# Patient Record
Sex: Female | Born: 1960
Health system: Southern US, Community
[De-identification: ages and names within clinical notes are randomized; demographics above are authoritative.]

## PROBLEM LIST (undated history)

## (undated) DIAGNOSIS — T7840XA Allergy, unspecified, initial encounter: Secondary | ICD-10-CM

## (undated) DIAGNOSIS — H8110 Benign paroxysmal vertigo, unspecified ear: Secondary | ICD-10-CM

## (undated) DIAGNOSIS — C439 Malignant melanoma of skin, unspecified: Secondary | ICD-10-CM

## (undated) DIAGNOSIS — U071 COVID-19: Secondary | ICD-10-CM

## (undated) DIAGNOSIS — Z9889 Other specified postprocedural states: Secondary | ICD-10-CM

## (undated) DIAGNOSIS — E785 Hyperlipidemia, unspecified: Secondary | ICD-10-CM

## (undated) DIAGNOSIS — R112 Nausea with vomiting, unspecified: Secondary | ICD-10-CM

## (undated) DIAGNOSIS — C4492 Squamous cell carcinoma of skin, unspecified: Secondary | ICD-10-CM

## (undated) HISTORY — PX: MICRODISCECTOMY LUMBAR: SUR864

## (undated) HISTORY — PX: CHOLECYSTECTOMY: SHX55

## (undated) HISTORY — PX: COLONOSCOPY: SHX174

## (undated) HISTORY — PX: BACK SURGERY: SHX140

## (undated) HISTORY — DX: Squamous cell carcinoma of skin, unspecified: C44.92

## (undated) HISTORY — DX: Malignant melanoma of skin, unspecified: C43.9

## (undated) HISTORY — PX: LIPOMA EXCISION: SHX5283

## (undated) HISTORY — DX: Hyperlipidemia, unspecified: E78.5

## (undated) HISTORY — DX: Allergy, unspecified, initial encounter: T78.40XA

## (undated) HISTORY — PX: BREAST SURGERY: SHX581

## (undated) HISTORY — DX: Benign paroxysmal vertigo, unspecified ear: H81.10

## (undated) HISTORY — PX: OTHER SURGICAL HISTORY: SHX169

## (undated) HISTORY — PX: APPENDECTOMY: SHX54

---

## 2012-02-23 ENCOUNTER — Other Ambulatory Visit: Payer: Self-pay | Admitting: Family Medicine

## 2012-02-23 DIAGNOSIS — N644 Mastodynia: Secondary | ICD-10-CM

## 2012-03-10 ENCOUNTER — Ambulatory Visit
Admission: RE | Admit: 2012-03-10 | Discharge: 2012-03-10 | Disposition: A | Discharge status: Home / Self Care | Payer: BC Managed Care – PPO | Source: Ambulatory Visit | Attending: Family Medicine | Admitting: Family Medicine

## 2012-03-10 DIAGNOSIS — N644 Mastodynia: Secondary | ICD-10-CM

## 2013-07-06 ENCOUNTER — Other Ambulatory Visit: Payer: Self-pay | Admitting: Family Medicine

## 2013-07-06 DIAGNOSIS — Z1231 Encounter for screening mammogram for malignant neoplasm of breast: Secondary | ICD-10-CM

## 2013-07-13 ENCOUNTER — Ambulatory Visit: Payer: BC Managed Care – PPO

## 2013-08-02 ENCOUNTER — Ambulatory Visit
Admission: RE | Admit: 2013-08-02 | Discharge: 2013-08-02 | Disposition: A | Discharge status: Home / Self Care | Payer: Managed Care, Other (non HMO) | Source: Ambulatory Visit | Attending: Family Medicine | Admitting: Family Medicine

## 2013-08-02 DIAGNOSIS — Z1231 Encounter for screening mammogram for malignant neoplasm of breast: Secondary | ICD-10-CM

## 2013-11-10 DIAGNOSIS — E785 Hyperlipidemia, unspecified: Secondary | ICD-10-CM | POA: Insufficient documentation

## 2014-06-13 ENCOUNTER — Encounter: Payer: Self-pay | Admitting: Nurse Practitioner

## 2014-06-13 ENCOUNTER — Ambulatory Visit (INDEPENDENT_AMBULATORY_CARE_PROVIDER_SITE_OTHER): Payer: BC Managed Care – PPO | Admitting: Nurse Practitioner

## 2014-06-13 VITALS — BP 124/77 | HR 63 | Temp 98.1°F | Resp 18 | Ht 64.0 in | Wt 151.0 lb

## 2014-06-13 DIAGNOSIS — Z23 Encounter for immunization: Secondary | ICD-10-CM

## 2014-06-13 DIAGNOSIS — R609 Edema, unspecified: Secondary | ICD-10-CM

## 2014-06-13 DIAGNOSIS — Z860101 Personal history of adenomatous and serrated colon polyps: Secondary | ICD-10-CM | POA: Insufficient documentation

## 2014-06-13 DIAGNOSIS — E785 Hyperlipidemia, unspecified: Secondary | ICD-10-CM | POA: Insufficient documentation

## 2014-06-13 DIAGNOSIS — Z86010 Personal history of colonic polyps: Secondary | ICD-10-CM

## 2014-06-13 NOTE — Progress Notes (Signed)
Subjective:     Mikayla Tucker is a 53 y.o. female who presents to establish care. She is concerned about a swollen area on her back. She noticed it last week. No pain. Skin looks normal. She gives a history of lipoma on abdomen that was removed several ya. Historical care at [facility] in [place]. She is not happy with follow up there & is seeking care with [place] today. She does not remember when her last Pap or Tdap was. She declines tdap today & will schedule CPE.  She reports having a SCC removed from R forearm 2 weeks ago.   The following portions of the patient's history were reviewed and updated as appropriate: allergies, current medications, past family history, past medical history, past social history, past surgical history and problem list.  Review of Systems Pertinent items are noted in HPI.    Objective:    BP 124/77  Pulse 63  Temp(Src) 98.1 F (36.7 C) (Oral)  Resp 18  Ht 5\' 4"  (1.626 m)  Wt 151 lb (68.493 kg)  BMI 25.91 kg/m2  SpO2 98% BP 124/77  Pulse 63  Temp(Src) 98.1 F (36.7 C) (Oral)  Resp 18  Ht 5\' 4"  (1.626 m)  Wt 151 lb (68.493 kg)  BMI 25.91 kg/m2  SpO2 98% General appearance: alert, cooperative, appears stated age and no distress Head: Normocephalic, without obvious abnormality, atraumatic Eyes: negative findings: lids and lashes normal and conjunctivae and sclerae normal Back: asymmetry noted in thoracic back due to soft tissue swelling at R thoracic region adjacent to spine. Feels like lipoma. About 3 cm X 2CM. Lungs: clear to auscultation bilaterally Heart: regular rate and rhythm, S1, S2 normal, no murmur, click, rub or gallop  Neck: no LAD, thyroid midline, No carotid bruit.  Assessment:  1. Need for prophylactic vaccination and inoculation against influenza - Flu Vaccine QUAD 36+ mos PF IM (Fluarix Quad PF)  2. Part of body swollen - [place] Chest; Future  3. Personal history of colonic polyps - Ambulatory referral to  Gastroenterology  4. Dyslipidemia Decrease crestor to 4d/wk from 7. Check again in 6 mos.  See problem list for complete A&P See pt instructions. F/u 1 mo.

## 2014-06-13 NOTE — Assessment & Plan Note (Signed)
Pt reports 1 polyp removed 10 ya. Will ref to GI for f/u. No old records to review.

## 2014-06-13 NOTE — Patient Instructions (Signed)
Please return for pelvic exam. I will check thyroid function & vitamin D at that time.  You have a healthy body mass index, so you are keeping your risk for chronic disease low.  Start crestor 4 days/week. I will check lipids again in 6 months.  Develop lifelong habits of exercise most days of the week: take a 30 minute walk. The benefits include weight loss, lower risk for heart disease, diabetes, stroke, high blood pressure, lower rates of depression & dementia, better sleep quality & bone health.  Please get ultrasound of back. I will call with results.  Nice to meet you!

## 2014-06-13 NOTE — Assessment & Plan Note (Signed)
2 cm swollen areas R back, adjacent to spine. Probably lipoma. She has Hx of lipoma on abdomen. No pain. Skin looks nml Will order [place].

## 2014-06-14 ENCOUNTER — Ambulatory Visit
Admission: RE | Admit: 2014-06-14 | Discharge: 2014-06-14 | Disposition: A | Discharge status: Home / Self Care | Payer: BC Managed Care – PPO | Source: Ambulatory Visit | Attending: Nurse Practitioner | Admitting: Nurse Practitioner

## 2014-06-14 ENCOUNTER — Telehealth: Payer: Self-pay | Admitting: Nurse Practitioner

## 2014-06-14 DIAGNOSIS — D171 Benign lipomatous neoplasm of skin and subcutaneous tissue of trunk: Secondary | ICD-10-CM

## 2014-06-14 DIAGNOSIS — R609 Edema, unspecified: Secondary | ICD-10-CM

## 2014-06-14 HISTORY — DX: Benign lipomatous neoplasm of skin and subcutaneous tissue of trunk: D17.1

## 2014-06-14 NOTE — Telephone Encounter (Signed)
Lipoma viewed on [place] of R back.

## 2014-07-07 ENCOUNTER — Encounter: Payer: BC Managed Care – PPO | Admitting: Nurse Practitioner

## 2014-07-20 ENCOUNTER — Encounter: Payer: Self-pay | Admitting: Nurse Practitioner

## 2014-08-11 ENCOUNTER — Other Ambulatory Visit: Payer: Self-pay

## 2014-08-11 ENCOUNTER — Other Ambulatory Visit: Payer: Self-pay | Admitting: Nurse Practitioner

## 2014-08-11 NOTE — Telephone Encounter (Signed)
[facility] requesting refill on Lorazepam 1 mg. Last office visit 06/13/2014

## 2014-08-11 NOTE — Telephone Encounter (Signed)
[facility] require a 90 day is this ok?

## 2014-08-11 NOTE — Telephone Encounter (Signed)
Please refill Ativan 30 T, 1 refill .

## 2014-08-14 ENCOUNTER — Other Ambulatory Visit: Payer: Self-pay

## 2014-08-14 MED ORDER — LORAZEPAM 1 MG PO TABS: 1.0000 mg | ORAL_TABLET | Freq: Two times a day (BID) | ORAL | Status: DC | PRN

## 2014-08-14 NOTE — Telephone Encounter (Signed)
Is she supposed to be taking this medication twice daily or once daily?

## 2014-08-14 NOTE — Telephone Encounter (Signed)
Yes. 90 tabs, 0 refill. Thank you.

## 2014-11-29 ENCOUNTER — Encounter: Payer: Self-pay | Admitting: Nurse Practitioner

## 2014-11-29 ENCOUNTER — Encounter: Payer: Self-pay | Admitting: Family Medicine

## 2014-11-29 ENCOUNTER — Encounter: Payer: Self-pay | Admitting: Internal Medicine

## 2014-11-29 ENCOUNTER — Ambulatory Visit (INDEPENDENT_AMBULATORY_CARE_PROVIDER_SITE_OTHER): Payer: BLUE CROSS/BLUE SHIELD | Admitting: Nurse Practitioner

## 2014-11-29 VITALS — BP 139/87 | HR 64 | Temp 97.4°F | Ht 64.0 in | Wt 152.0 lb

## 2014-11-29 DIAGNOSIS — D171 Benign lipomatous neoplasm of skin and subcutaneous tissue of trunk: Secondary | ICD-10-CM

## 2014-11-29 DIAGNOSIS — E785 Hyperlipidemia, unspecified: Secondary | ICD-10-CM

## 2014-11-29 DIAGNOSIS — Z0000 Encounter for general adult medical examination without abnormal findings: Secondary | ICD-10-CM | POA: Insufficient documentation

## 2014-11-29 DIAGNOSIS — Z1211 Encounter for screening for malignant neoplasm of colon: Secondary | ICD-10-CM

## 2014-11-29 DIAGNOSIS — L989 Disorder of the skin and subcutaneous tissue, unspecified: Secondary | ICD-10-CM

## 2014-11-29 LAB — CBC WITH DIFFERENTIAL/PLATELET
BASOS PCT: 1.1 % (ref 0.0–3.0)
Basophils Absolute: 0.1 10*3/uL (ref 0.0–0.1)
EOS ABS: 0.2 10*3/uL (ref 0.0–0.7)
Eosinophils Relative: 2.8 % (ref 0.0–5.0)
HCT: 39.8 % (ref 36.0–46.0)
Hemoglobin: 13.5 g/dL (ref 12.0–15.0)
Lymphocytes Relative: 36 % (ref 12.0–46.0)
Lymphs Abs: 2.1 10*3/uL (ref 0.7–4.0)
MCHC: 34 g/dL (ref 30.0–36.0)
MCV: 102.3 fl — AB (ref 78.0–100.0)
MONO ABS: 0.3 10*3/uL (ref 0.1–1.0)
Monocytes Relative: 6.1 % (ref 3.0–12.0)
NEUTROS PCT: 54 % (ref 43.0–77.0)
Neutro Abs: 3.1 10*3/uL (ref 1.4–7.7)
PLATELETS: 297 10*3/uL (ref 150.0–400.0)
RBC: 3.89 Mil/uL (ref 3.87–5.11)
RDW: 13 % (ref 11.5–15.5)
WBC: 5.8 10*3/uL (ref 4.0–10.5)

## 2014-11-29 LAB — COMPREHENSIVE METABOLIC PANEL
ALT: 28 U/L (ref 0–35)
AST: 25 U/L (ref 0–37)
Albumin: 4.7 g/dL (ref 3.5–5.2)
Alkaline Phosphatase: 92 U/L (ref 39–117)
BUN: 12 mg/dL (ref 6–23)
CALCIUM: 9.8 mg/dL (ref 8.4–10.5)
CO2: 26 mEq/L (ref 19–32)
Chloride: 99 mEq/L (ref 96–112)
Creatinine, Ser: 0.54 mg/dL (ref 0.40–1.20)
GFR: 125.56 mL/min (ref >60.00)
Glucose, Bld: 86 mg/dL (ref 70–99)
Potassium: 4.2 mEq/L (ref 3.5–5.1)
SODIUM: 133 meq/L — AB (ref 135–145)
TOTAL PROTEIN: 7.5 g/dL (ref 6.0–8.3)
Total Bilirubin: 0.5 mg/dL (ref 0.2–1.2)

## 2014-11-29 LAB — LIPID PANEL
CHOLESTEROL: 268 mg/dL — AB (ref 0–200)
HDL: 72.3 mg/dL (ref >39.00)
LDL Cholesterol: 177 mg/dL — ABNORMAL HIGH (ref 0–99)
NonHDL: 195.7
TRIGLYCERIDES: 96 mg/dL (ref 0.0–149.0)
Total CHOL/HDL Ratio: 4
VLDL: 19.2 mg/dL (ref 0.0–40.0)

## 2014-11-29 LAB — T4, FREE: FREE T4: 0.66 ng/dL (ref 0.60–1.60)

## 2014-11-29 LAB — HEMOGLOBIN A1C: Hgb A1c MFr Bld: 5.8 % (ref 4.6–6.5)

## 2014-11-29 LAB — TSH: TSH: 1.07 u[IU]/mL (ref 0.35–4.50)

## 2014-11-29 LAB — VITAMIN D 25 HYDROXY (VIT D DEFICIENCY, FRACTURES): VITD: 19.59 ng/mL — ABNORMAL LOW (ref 30.00–100.00)

## 2014-11-29 NOTE — Assessment & Plan Note (Signed)
Refer to derm for eval & treat.

## 2014-11-29 NOTE — Progress Notes (Signed)
Subjective:     Mikayla Tucker is a 54 y.o. female and is here for a comprehensive physical exam and f/u of hyperlipidemia. She also has abnormal exam finding of skin lesion.   Mikayla Tucker is currently treated for hyperlipidemia, she has had colon polyp in past & BCC removed from arm. Hx back surgery w/hardware-states prior to surgery she was walking w/cane. Used to be long distance runner. Prev care: due for Tdap, plans to establish w/gyn for Pap & MMG. No MC-early menopause at 54 yo. No postmenopausal bleeding. Reviewed BMI & discussed slight increased risk for obesity related disease. Discussed exercise & diet changes. Will ref to GI for colonoscopy for colon ca screen & f/u of polyp removal. Lipids: current med crestor 40 mg 5 d/wk. No intol SE. RF: truncal obesity. No fam Hx-adopted.  Skin lesion: see exam. Pt will sched APPT. W/derm. Will fax my notes today.    History   Social History  . Marital Status: Married    Spouse Name: N/A  . Number of Children: 1  . Years of Education: N/A   Occupational History  .      homemaker   Social History Main Topics  . Smoking status: Former Smoker -- 1.00 packs/day for 10 years  . Smokeless tobacco: Not on file     Comment: quit 54 yrs old  . Alcohol Use: 4.2 oz/week    7 Glasses of wine per week  . Drug Use: No  . Sexual Activity: Yes    Birth Control/ Protection: Post-menopausal   Other Topics Concern  . Not on file   Social History Narrative   Mikayla Tucker lives with her husband and daughter. She is originally from [place]-moved to [place]. She relocated to [facility] about [place] from [place].   Health Maintenance  Topic Date Due  . HIV Screening  12/31/1976  . PAP SMEAR  01/01/1980  . TETANUS/TDAP  12/31/1980  . COLONOSCOPY  01/01/2012  . INFLUENZA VACCINE  05/07/2015  . MAMMOGRAM  08/03/2015    The following portions of the patient's history were reviewed and updated as appropriate: allergies, current medications, past family history,  past medical history, past social history, past surgical history and problem list.  Review of Systems Constitutional: negative for fatigue and fevers Eyes: positive for contacts/glasses and had recent eye exam-no changes Ears, nose, mouth, throat, and face: positive for chronic rhinitis relieved by sinus rinse, negative for sore throat Cardiovascular: negative for exertional chest pressure/discomfort, irregular heart beat and lower extremity edema Gastrointestinal: negative for abdominal pain, change in bowel habits, constipation, diarrhea and dyspepsia Musculoskeletal:positive for occasional back pain   Objective:    BP 139/87 mmHg  Pulse 64  Temp(Src) 97.4 F (36.3 C) (Temporal)  Ht 5\' 4"  (1.626 m)  Wt 152 lb (68.947 kg)  BMI 26.08 kg/m2  SpO2 99% General appearance: alert, cooperative, appears stated age and no distress Head: Normocephalic, without obvious abnormality, atraumatic Eyes: negative findings: lids and lashes normal, conjunctivae and sclerae normal, corneas clear and pupils equal, round, reactive to light and accomodation Ears: normal TM's and external ear canals both ears Throat: lips, mucosa, and tongue normal; teeth and gums normal and geographic tongue Neck: no adenopathy, no carotid bruit, supple, symmetrical, trachea midline and thyroid not enlarged, symmetric, no tenderness/mass/nodules Back: well healed lumbar midline scar. large lipoma R back adjacent to spine. No change. about 3-4 inches vertical axis, 1.5-2" horiz axis. Lungs: clear to auscultation bilaterally Heart: regular rate  and rhythm, S1, S2 normal, no murmur, click, rub or gallop Abdomen: soft, non-tender; bowel sounds normal; no masses,  no organomegaly and truncal obesity Extremities: no edema, redness or tenderness in the calves or thighs and very thin legs Pulses: 2+ and symmetric Skin: well healed scar on each forearm-pt states from lesion removal. R scar slightly pink w/small scab-pt says she  pulled suture out of it recently. Back: R thorax under bra purple 1 cm lesion that blanches with smaller darker central irreg -shaped pigment that does not blanch. Lymph nodes: Cervical, supraclavicular, and axillary nodes normal. Neurologic: Grossly normal    Assessment:Plan   1. Preventative health care Needs Tdap Will see gyn for pap & MMg - CBC with Differential/Platelet - Comprehensive metabolic panel - Lipid panel - Hemoglobin A1c - Vit D  25 hydroxy (rtn osteoporosis monitoring) - TSH - T4, free - HIV antibody Discussed diet & exercise changes  2. Colon cancer screening persnl Hx polyp - Ambulatory referral to Gastroenterology  3. Dyslipidemia, chronic, stable - Comprehensive metabolic panel - Lipid panel - Hemoglobin A1c  4. Skin lesion of back, New See derm  5. Lipoma of back, stable Reviewed [place], no f/u recommended. Cosmetic only  F/u 6 mos-lipids See pt instructions

## 2014-11-29 NOTE — Progress Notes (Signed)
Makes sense. Thank you!

## 2014-11-29 NOTE — Progress Notes (Signed)
Pre visit review using our clinic review tool, if applicable. No additional management support is needed unless otherwise documented below in the visit note. 

## 2014-11-29 NOTE — Assessment & Plan Note (Signed)
Lipids today

## 2014-11-29 NOTE — Assessment & Plan Note (Signed)
Needs tdap-declined Due for mmg, pap-will see gynecology Due for colonoscopy-pers Hx polyps Needs tdap Increase exercise 30 mins to 1 hr daily Few diet changes-read Eat to Live

## 2014-11-29 NOTE — Patient Instructions (Signed)
Call dermatology for evaluation of purple lesion on back. See gynecology. Consider [facility]. Schedule mammogram. Schedule colonoscopy.  Normal body mass index is 19 to 24.9. You are at 25. For best health keep calories under 1860 daily.  Develop lifelong habits of exercise most days of the week: take a 30 minute walk. The benefits include weight loss, lower risk for heart disease, diabetes, stroke, high blood pressure, lower rates of depression & dementia, better sleep quality & bone health.  Consider reading Eat to Live by Excell Seltzer and begin implementing principles for best human nutrition & health.   Cut out refined sugar:anything that is sweet when you eat or drink it except fresh fruit. Cut out white bread, rolls, biscuits, bagels, muffins, pasta and cereals. Breads & cereals that have 4 gm or more of fiber per serving are good. Whole wheat pasta, brown rice and quinoa are good.  Minimize alcoholic beverages. A glass of wine has 150 to 200 calories-the sweeter the wine, the more calories.   My office will call with lab results.  Great to see you!

## 2014-11-30 ENCOUNTER — Other Ambulatory Visit: Payer: Self-pay | Admitting: Nurse Practitioner

## 2014-11-30 ENCOUNTER — Ambulatory Visit (INDEPENDENT_AMBULATORY_CARE_PROVIDER_SITE_OTHER): Payer: BLUE CROSS/BLUE SHIELD | Admitting: Nurse Practitioner

## 2014-11-30 ENCOUNTER — Telehealth: Payer: Self-pay | Admitting: Nurse Practitioner

## 2014-11-30 ENCOUNTER — Encounter: Payer: Self-pay | Admitting: Nurse Practitioner

## 2014-11-30 VITALS — BP 120/82 | HR 81 | Temp 98.4°F | Ht 64.0 in

## 2014-11-30 DIAGNOSIS — E785 Hyperlipidemia, unspecified: Secondary | ICD-10-CM

## 2014-11-30 DIAGNOSIS — E559 Vitamin D deficiency, unspecified: Secondary | ICD-10-CM

## 2014-11-30 DIAGNOSIS — R7309 Other abnormal glucose: Secondary | ICD-10-CM

## 2014-11-30 DIAGNOSIS — R7303 Prediabetes: Secondary | ICD-10-CM | POA: Insufficient documentation

## 2014-11-30 LAB — HIV ANTIBODY (ROUTINE TESTING W REFLEX): HIV 1&2 Ab, 4th Generation: NONREACTIVE

## 2014-11-30 MED ORDER — ROSUVASTATIN CALCIUM 40 MG PO TABS: 40.0000 mg | ORAL_TABLET | Freq: Every day | ORAL | Status: DC

## 2014-11-30 NOTE — Telephone Encounter (Signed)
Patient returned call and was given results. Patient scheduled appt for this afternoon.

## 2014-11-30 NOTE — Patient Instructions (Signed)
To decrease risk for diabetes exercise daily at least 30 minutes. Taking a brisk walk counts. Cut out  refined sugar:anything that is sweet when you eat or drink it except fresh fruit. Cut refined grains: white bread, rolls, biscuits, bagels, muffins, pasta and cereals. Grains that have 4 gm or more of fiber per serving are good.  Minimize alcohol intake to 2-3 times week or less.  Read Eat to Live!  Start D3 5000 iu daily with meal. Take for 12 weeks then return for lab.

## 2014-11-30 NOTE — Assessment & Plan Note (Signed)
Increase crestor to daily Start 1000 to 2000 mg qd mega red krill oil increase exercise Pt does not want to cut back wine consumption, but will switch from white to red for less sugar Already eats healthy:meat 2-3 times week, cheese once week, a lot of fruit & veggies Repeat lipids in 12 weeks

## 2014-11-30 NOTE — Telephone Encounter (Signed)
pls call pt: Advise Overall labs are good, but I would like to speak with her about increased risk for diabetes & preventive measures.pLS SCHEDULE ov. ALSO, VIT d IS LOW. sTART D3 5000 IU QD WITH MEAL for 12 weeks & will recheck.

## 2014-11-30 NOTE — Progress Notes (Signed)
Subjective:     Mikayla Tucker is a 54 y.o. female presents to discuss new Dx of prediabetes & vitamin d deficiency and ongoing management of lipids. Meets 2 criteria for metabolic syndrome: truncal obesity & elevated BS.  Prediabetes: A1C 5.8. Discussed increased risk of developing diabetes. Discussed exercise & diet interventions to lower risk. Vit D def: level 19. Hx skin cancer. Discussed correlative studies linking cancers & AI disease to low D. Discussed starting supplement.   Lipids: LDL too high-170. Taking crestor 5d/wk. Will increase to daily. Discussed benefits of Increasing exercise & diet changes. Already eats healthy diet, but could cut back on wine consumption-she would rather not.  The following portions of the patient's history were reviewed and updated as appropriate: allergies, current medications, past medical history, past social history, past surgical history and problem list.  Review of Systems Musculoskeletal:negative, back feels better when walks, pain does not prevent moderate exercise    Objective:    BP 120/82 mmHg  Pulse 81  Temp(Src) 98.4 F (36.9 C) (Oral)  Ht 5\' 4"  (1.626 m)  SpO2 96% BP 120/82 mmHg  Pulse 81  Temp(Src) 98.4 F (36.9 C) (Oral)  Ht 5\' 4"  (1.626 m)  SpO2 96% General appearance: alert, cooperative, appears stated age and no distress Head: Normocephalic, without obvious abnormality, atraumatic Eyes: negative findings: lids and lashes normal and conjunctivae and sclerae normal Neurologic: Grossly normal    Assessment:Plan    1. Vitamin D deficiency Start d3 5000 iu qd repeat 12 wks  2. Prediabetes Increase exercise Diet changes Repeat 12 wks   3. Dyslipidemia Increase crestor to daily 1000 mg krill oil daily Increase exercise Diet changes Repeat 12 wks

## 2014-11-30 NOTE — Assessment & Plan Note (Addendum)
A1C 5.8 Increase exercise to daily-30 minutes or more Decrease wine consumption-pt declines, but will switch from white to red. Does not eat sugar. Check A1C in 12 weeks.

## 2014-11-30 NOTE — Telephone Encounter (Signed)
LMOVM for pt to return call 

## 2014-11-30 NOTE — Assessment & Plan Note (Signed)
Start D3 5000 iu qd with meal Repeat level in 12 wks

## 2014-11-30 NOTE — Progress Notes (Signed)
Pre visit review using our clinic review tool, if applicable. No additional management support is needed unless otherwise documented below in the visit note. 

## 2014-12-20 ENCOUNTER — Ambulatory Visit (AMBULATORY_SURGERY_CENTER): Payer: Self-pay | Admitting: *Deleted

## 2014-12-20 VITALS — Ht 64.0 in | Wt 150.0 lb

## 2014-12-20 DIAGNOSIS — Z1211 Encounter for screening for malignant neoplasm of colon: Secondary | ICD-10-CM

## 2014-12-20 MED ORDER — MOVIPREP 100 G PO SOLR: 1.0000 | Freq: Once | ORAL | Status: DC

## 2014-12-20 NOTE — Progress Notes (Signed)
No egg or soy allergy. No anesthesia problems.  No home O2.  No diet meds.  

## 2014-12-22 ENCOUNTER — Telehealth: Payer: Self-pay | Admitting: Internal Medicine

## 2014-12-22 NOTE — Telephone Encounter (Signed)
Instructions with new date and times sent to patient's mailing address.  Angela/previsit

## 2014-12-25 ENCOUNTER — Encounter (HOSPITAL_COMMUNITY): Payer: Self-pay | Admitting: Family Medicine

## 2014-12-25 ENCOUNTER — Emergency Department (HOSPITAL_COMMUNITY)
Admission: EM | Admit: 2014-12-25 | Discharge: 2014-12-25 | Disposition: A | Discharge status: Home / Self Care | Payer: BLUE CROSS/BLUE SHIELD | Attending: Emergency Medicine | Admitting: Emergency Medicine

## 2014-12-25 ENCOUNTER — Emergency Department (HOSPITAL_COMMUNITY): Payer: BLUE CROSS/BLUE SHIELD

## 2014-12-25 DIAGNOSIS — Z79899 Other long term (current) drug therapy: Secondary | ICD-10-CM | POA: Insufficient documentation

## 2014-12-25 DIAGNOSIS — Z85828 Personal history of other malignant neoplasm of skin: Secondary | ICD-10-CM | POA: Diagnosis not present

## 2014-12-25 DIAGNOSIS — E785 Hyperlipidemia, unspecified: Secondary | ICD-10-CM | POA: Insufficient documentation

## 2014-12-25 DIAGNOSIS — Y9389 Activity, other specified: Secondary | ICD-10-CM | POA: Insufficient documentation

## 2014-12-25 DIAGNOSIS — Y998 Other external cause status: Secondary | ICD-10-CM | POA: Diagnosis not present

## 2014-12-25 DIAGNOSIS — Y9289 Other specified places as the place of occurrence of the external cause: Secondary | ICD-10-CM | POA: Insufficient documentation

## 2014-12-25 DIAGNOSIS — W1839XA Other fall on same level, initial encounter: Secondary | ICD-10-CM | POA: Diagnosis not present

## 2014-12-25 DIAGNOSIS — S299XXA Unspecified injury of thorax, initial encounter: Secondary | ICD-10-CM | POA: Insufficient documentation

## 2014-12-25 DIAGNOSIS — Z87891 Personal history of nicotine dependence: Secondary | ICD-10-CM | POA: Insufficient documentation

## 2014-12-25 DIAGNOSIS — M940 Chondrocostal junction syndrome [Tietze]: Secondary | ICD-10-CM

## 2014-12-25 MED ORDER — HYDROCODONE-ACETAMINOPHEN 5-325 MG PO TABS
2.0000 | ORAL_TABLET | Freq: Once | ORAL | Status: AC
  Administered 2014-12-25: 2 via ORAL
  Filled 2014-12-25: qty 2

## 2014-12-25 MED ORDER — KETOROLAC TROMETHAMINE 60 MG/2ML IM SOLN
30.0000 mg | Freq: Once | INTRAMUSCULAR | Status: AC
  Administered 2014-12-25: 30 mg via INTRAMUSCULAR
  Filled 2014-12-25: qty 2

## 2014-12-25 NOTE — ED Provider Notes (Signed)
CSN: [id number]     Arrival date & time 12/25/14  3570 History  This chart was scribed for non-physician practitioner, Al Corpus, PA-C, working with Evelina Bucy, MD by Ladene Artist, ED Scribe. This patient was seen in room TR08C/TR08C and the patient's care was started at 10:01 AM.   Chief Complaint  Patient presents with  . Rib Injury   The history is provided by the patient. No language interpreter was used.   HPI Comments: Mikayla Tucker is a 54 y.o. female, with a h/o hyperlipidemia, SCC CA, who presents to the Emergency Department complaining of persistent R rib pain for the past 2 days. Pain is exacerbated with coughing, sneezing, deep breathing, and movement. She reports a fall 3 days ago but states that she did not think it was anything serious. Pt denies fever, chills, sore throat, rhinorrhea. No sick contacts. She states that she has had a fatty tumor removed from the area 10 years ago. Pt has been treating with Advil with minimal relief    Past Medical History  Diagnosis Date  . Allergy   . Cancer     SCC  . Hyperlipidemia    Past Surgical History  Procedure Laterality Date  . Appendectomy    . Cholecystectomy    . Breast surgery      biopsy  . Back surgery    . Lipoma excision      under breast on R side  . Colonoscopy     Family History  Problem Relation Age of Onset  . Adopted: Yes  . Colon cancer Neg Hx    History  Substance Use Topics  . Smoking status: Former Smoker -- 1.00 packs/day for 10 years  . Smokeless tobacco: Never Used     Comment: quit 55 yrs old  . Alcohol Use: 4.2 oz/week    7 Glasses of wine per week   OB History    No data available     Review of Systems  Constitutional: Negative for fever and chills.  HENT: Negative for rhinorrhea and sore throat.   Musculoskeletal:       + Rib pain   Allergies  Hydrocodone-acetaminophen  Home Medications   Prior to Admission medications   Medication Sig Start Date End Date Taking?  Authorizing Provider  KRILL OIL OMEGA-3 PO Take by mouth.    Historical Provider, MD  LORazepam (ATIVAN) 1 MG tablet Take 1 tablet (1 mg total) by mouth 2 (two) times daily as needed for anxiety. 08/14/14   Irene Pap, NP  MOVIPREP 100 G SOLR Take 1 kit (200 g total) by mouth once. Name brand only, movi prep as directed, no substitutions. 12/20/14   Lafayette Dragon, MD  rosuvastatin (CRESTOR) 40 MG tablet Take 1 tablet (40 mg total) by mouth daily. 11/30/14   Irene Pap, NP  Vitamin D, Ergocalciferol, (DRISDOL) 50000 UNITS CAPS capsule Take 50,000 Units by mouth every 7 (seven) days.    Historical Provider, MD   BP 149/56 mmHg  Pulse 60  Temp(Src) 97.5 F (36.4 C)  Resp 18  SpO2 98% Physical Exam  Constitutional: She appears well-developed and well-nourished. No distress.  HENT:  Head: Normocephalic and atraumatic.  Eyes: Conjunctivae and EOM are normal. Right eye exhibits no discharge. Left eye exhibits no discharge.  Cardiovascular: Normal rate and regular rhythm.   Pulmonary/Chest: Effort normal and breath sounds normal. No respiratory distress. She has no wheezes.  Abdominal: Soft. Bowel sounds are normal. She exhibits no  distension. There is no tenderness.  Musculoskeletal:  Tenderness below R breast and laterally. No skin changes, flail chest or subcutaneous air.   Neurological: She is alert. She exhibits normal muscle tone. Coordination normal.  Skin: Skin is warm and dry. She is not diaphoretic.  Nursing note and vitals reviewed.  ED Course  Procedures (including critical care time) DIAGNOSTIC STUDIES: Oxygen Saturation is 98% on RA, normal by my interpretation.    COORDINATION OF CARE: 10:06 AM-Discussed treatment plan which includes CXR, Tylenol, ibuprofen, Toradol injection, and Vicodin with pt at bedside and pt agreed to plan.   Labs Review Labs Reviewed - No data to display  Imaging Review Dg Ribs Unilateral W/chest Right  12/25/2014   CLINICAL DATA:  Fall on  right side 4 days ago with persistent right-sided chest pain and shortness of Breath  EXAM: RIGHT RIBS AND CHEST - 3+ VIEW  COMPARISON:  None.  FINDINGS: Cardiac shadow is within normal limits. The lungs are well aerated bilaterally without focal infiltrate or sizable effusion. No pneumothorax is noted. No acute rib fracture is identified.  IMPRESSION: No acute abnormality noted.   Electronically Signed   By: Inez Catalina M.D.   On: 12/25/2014 09:35    EKG Interpretation None      MDM   Final diagnoses:  Costochondritis   Patient presenting with fall and right rib pain that's aggravated with movement. She denies any URI symptoms. VSS. No skin changes no flail chest or subcutaneous air. Chest x-ray without rib fracture or acute lung injury. Patient likely with costochondritis pain managed NAD. Patient to continue to treat with rice protocol as well as ibuprofen. She is to follow-up with PCP for persistent symptoms.  Discussed return precautions with patient. Discussed all results and patient verbalizes understanding and agrees with plan.  I personally performed the services described in this documentation, which was scribed in my presence. The recorded information has been reviewed and is accurate.   Al Corpus, PA-C 12/25/14 1023  Evelina Bucy, MD 12/25/14 [phone]

## 2014-12-25 NOTE — Discharge Instructions (Signed)
Return to the emergency room with worsening of symptoms, new symptoms or with symptoms that are concerning, especially difficulty breathing, shortness of breath, chest pain, bruising, unable to move to the pain. RICE: Rest, Ice (three cycles of 20 mins on, 55mins off at least twice a day), compression/brace, elevation. Heating pad works well for back pain. Ibuprofen 400mg  (2 tablets 200mg ) every 5-6 hours for 3-5 days. Follow up with PCP if symptoms worsen or are persistent. Read below information and follow recommendations. Costochondritis Costochondritis, sometimes called Tietze syndrome, is a swelling and irritation (inflammation) of the tissue (cartilage) that connects your ribs with your breastbone (sternum). It causes pain in the chest and rib area. Costochondritis usually goes away on its own over time. It can take up to 6 weeks or longer to get better, especially if you are unable to limit your activities. CAUSES  Some cases of costochondritis have no known cause. Possible causes include:  Injury (trauma).  Exercise or activity such as lifting.  Severe coughing. SIGNS AND SYMPTOMS  Pain and tenderness in the chest and rib area.  Pain that gets worse when coughing or taking deep breaths.  Pain that gets worse with specific movements. DIAGNOSIS  Your health care provider will do a physical exam and ask about your symptoms. Chest X-rays or other tests may be done to rule out other problems. TREATMENT  Costochondritis usually goes away on its own over time. Your health care provider may prescribe medicine to help relieve pain. HOME CARE INSTRUCTIONS   Avoid exhausting physical activity. Try not to strain your ribs during normal activity. This would include any activities using chest, abdominal, and side muscles, especially if heavy weights are used.  Apply ice to the affected area for the first 2 days after the pain begins.  Put ice in a plastic bag.  Place a towel between your  skin and the bag.  Leave the ice on for 20 minutes, 2-3 times a day.  Only take over-the-counter or prescription medicines as directed by your health care provider. SEEK MEDICAL CARE IF:  You have redness or swelling at the rib joints. These are signs of infection.  Your pain does not go away despite rest or medicine. SEEK IMMEDIATE MEDICAL CARE IF:   Your pain increases or you are very uncomfortable.  You have shortness of breath or difficulty breathing.  You cough up blood.  You have worse chest pains, sweating, or vomiting.  You have a fever or persistent symptoms for more than 2-3 days.  You have a fever and your symptoms suddenly get worse. MAKE SURE YOU:   Understand these instructions.  Will watch your condition.  Will get help right away if you are not doing well or get worse. Document Released: 07/02/2005 Document Revised: 07/13/2013 Document Reviewed: 04/26/2013 [facility] Patient Information 2015 [place], [place]. This information is not intended to replace advice given to you by your health care provider. Make sure you discuss any questions you have with your health care provider.

## 2014-12-25 NOTE — ED Notes (Signed)
Pt here for right rib pain. sts that she fell on the coffee table Friday. sts hurts when she moves and coughs.

## 2014-12-27 ENCOUNTER — Ambulatory Visit (INDEPENDENT_AMBULATORY_CARE_PROVIDER_SITE_OTHER): Payer: BLUE CROSS/BLUE SHIELD | Admitting: Nurse Practitioner

## 2014-12-27 ENCOUNTER — Encounter: Payer: Self-pay | Admitting: Nurse Practitioner

## 2014-12-27 VITALS — BP 135/83 | HR 67 | Temp 97.4°F | Ht 64.0 in | Wt 151.0 lb

## 2014-12-27 DIAGNOSIS — M940 Chondrocostal junction syndrome [Tietze]: Secondary | ICD-10-CM

## 2014-12-27 DIAGNOSIS — F419 Anxiety disorder, unspecified: Secondary | ICD-10-CM

## 2014-12-27 MED ORDER — ONDANSETRON 8 MG PO TBDP: 8.0000 mg | ORAL_TABLET | Freq: Two times a day (BID) | ORAL | Status: DC | PRN

## 2014-12-27 MED ORDER — HYDROCODONE-ACETAMINOPHEN 5-325 MG PO TABS: 1.0000 | ORAL_TABLET | Freq: Two times a day (BID) | ORAL | Status: DC | PRN

## 2014-12-27 MED ORDER — LORAZEPAM 1 MG PO TABS: 1.0000 mg | ORAL_TABLET | Freq: Two times a day (BID) | ORAL | Status: DC | PRN

## 2014-12-27 NOTE — Patient Instructions (Signed)
Start advil/ibuprophen: 600 to 800 mg with food, 3 times daily in first 3 days, then 400 to 600 mg twice daily as needed. You may add 1000 mg tylenol twice daily if needed. Take hydrocodone & zofran twice daily if needed when not driving or using machinery. Use heat to painful area as often as able. Take 5 to 10 deep breaths every hour to avoid pneumonia. Move shoulder through range of motion daily to prevent frozen shoulder. Please return in 2 weeks to evaluate. Let us know if you have concerns.   Costochondritis Costochondritis, sometimes called Tietze syndrome, is a swelling and irritation (inflammation) of the tissue (cartilage) that connects your ribs with your breastbone (sternum). It causes pain in the chest and rib area. Costochondritis usually goes away on its own over time. It can take up to 6 weeks or longer to get better, especially if you are unable to limit your activities. CAUSES  Some cases of costochondritis have no known cause. Possible causes include:  Injury (trauma).  Exercise or activity such as lifting.  Severe coughing. SIGNS AND SYMPTOMS  Pain and tenderness in the chest and rib area.  Pain that gets worse when coughing or taking deep breaths.  Pain that gets worse with specific movements. DIAGNOSIS  Your health care provider will do a physical exam and ask about your symptoms. Chest X-rays or other tests may be done to rule out other problems. TREATMENT  Costochondritis usually goes away on its own over time. Your health care provider may prescribe medicine to help relieve pain. HOME CARE INSTRUCTIONS   Avoid exhausting physical activity. Try not to strain your ribs during normal activity. This would include any activities using chest, abdominal, and side muscles, especially if heavy weights are used.  Apply ice to the affected area for the first 2 days after the pain begins.  Put ice in a plastic bag.  Place a towel between your skin and the  bag.  Leave the ice on for 20 minutes, 2-3 times a day.  Only take over-the-counter or prescription medicines as directed by your health care provider. SEEK MEDICAL CARE IF:  You have redness or swelling at the rib joints. These are signs of infection.  Your pain does not go away despite rest or medicine. SEEK IMMEDIATE MEDICAL CARE IF:   Your pain increases or you are very uncomfortable.  You have shortness of breath or difficulty breathing.  You cough up blood.  You have worse chest pains, sweating, or vomiting.  You have a fever or persistent symptoms for more than 2-3 days.  You have a fever and your symptoms suddenly get worse. MAKE SURE YOU:   Understand these instructions.  Will watch your condition.  Will get help right away if you are not doing well or get worse. Document Released: 07/02/2005 Document Revised: 07/13/2013 Document Reviewed: 04/26/2013 [facility] Patient Information 2015 [place], [place]. This information is not intended to replace advice given to you by your health care provider. Make sure you discuss any questions you have with your health care provider.

## 2014-12-27 NOTE — Progress Notes (Signed)
Subjective:    Mikayla Tucker is a 54 y.o. female who presents for evaluation of chest wall pain. Onset was 4 days ago. Symptoms have been stable since that time. The patient describes the pain as aching, sharp and stabbing in the lateral chest wall: on the right. Patient rates pain as a 8/10 in intensity. Associated symptoms are: none. Aggravating factors are: coughing, deep inspiration, exercise, lifting. Alleviating factors are: medications hydrocodone and rest. Mechanism of injury: hit edge of dresser night before pain, lifted 40 lb bag of seed, driving "straight drive" vehicle last few days. Previous visits for this problem: ER visit 3/21. Evaluation to date: chest x-ray which was normal and rib x-ray which was normal. Treatment to date: opioid analgesics: effective-took 1/2 daughter's hydrocodone with relief. Took 1/2 tab zodran as hydrocodone causes nausea.  The following portions of the patient's history were reviewed and updated as appropriate: allergies, current medications, past medical history, past social history, past surgical history and problem list.  Review of Systems Pertinent items are noted in HPI.   Objective:    BP 135/83 mmHg  Pulse 67  Temp(Src) 97.4 F (36.3 C) (Temporal)  Ht 5\' 4"  (1.626 m)  Wt 151 lb (68.493 kg)  BMI 25.91 kg/m2  SpO2 99% General appearance: alert, cooperative, appears stated age and mild distress Head: Normocephalic, without obvious abnormality, atraumatic Eyes: negative findings: lids and lashes normal and conjunctivae and sclerae normal Lungs: clear to auscultation bilaterally Heart: regular rate and rhythm, S1, S2 normal, no murmur, click, rub or gallop Neurologic: Grossly normal MSK: tender to palpation lat R ribs. Pain with deep inspiration & abduction of R arm.  Imaging Chest x-ray: normal chest x-ray and Cardiac shadow is within normal limits. The lungs are well aerated no rib Fracture or other abnormality  Assessment:Plan  1.  Costochondritis ibuprophen-see pt instructions - HYDROcodone-acetaminophen (NORCO/VICODIN) 5-325 MG per tablet; Take 1 tablet by mouth 2 (two) times daily as needed for moderate pain.  Dispense: 15 tablet; Refill: 0 - ondansetron (ZOFRAN-ODT) 8 MG disintegrating tablet; Place 1 tablet (8 mg total) inside cheek 2 (two) times daily as needed for nausea or vomiting.  Dispense: 20 tablet; Refill: 0  2. Anxiety - LORazepam (ATIVAN) 1 MG tablet; Take 1 tablet (1 mg total) by mouth 2 (two) times daily as needed for anxiety.  Dispense: 90 tablet; Refill: 0  F/u 2 weeks

## 2014-12-27 NOTE — Progress Notes (Signed)
Pre visit review using our clinic review tool, if applicable. No additional management support is needed unless otherwise documented below in the visit note. 

## 2015-01-03 ENCOUNTER — Other Ambulatory Visit: Payer: Self-pay

## 2015-01-03 ENCOUNTER — Encounter: Payer: BLUE CROSS/BLUE SHIELD | Admitting: Internal Medicine

## 2015-01-03 DIAGNOSIS — F419 Anxiety disorder, unspecified: Secondary | ICD-10-CM

## 2015-01-03 NOTE — Telephone Encounter (Signed)
error 

## 2015-01-10 ENCOUNTER — Encounter: Payer: Self-pay | Admitting: Internal Medicine

## 2015-01-12 ENCOUNTER — Telehealth: Payer: Self-pay | Admitting: Internal Medicine

## 2015-01-12 ENCOUNTER — Encounter: Payer: Self-pay | Admitting: *Deleted

## 2015-01-12 NOTE — Telephone Encounter (Signed)
New instructions mailed to patient. Patient aware.

## 2015-01-17 ENCOUNTER — Encounter: Payer: BLUE CROSS/BLUE SHIELD | Admitting: Internal Medicine

## 2015-02-05 ENCOUNTER — Other Ambulatory Visit: Payer: Self-pay

## 2015-02-05 DIAGNOSIS — E785 Hyperlipidemia, unspecified: Secondary | ICD-10-CM

## 2015-02-05 MED ORDER — ROSUVASTATIN CALCIUM 40 MG PO TABS: 40.0000 mg | ORAL_TABLET | Freq: Every day | ORAL | Status: DC

## 2015-02-05 NOTE — Telephone Encounter (Signed)
Please Advise Refill Request? Refill request for- Crestor 40mg , 90 day supply Last filled by MD on - 11/30/14 Last Appt - 12/27/14        Next Appt - 03/01/15 [facility]

## 2015-02-23 ENCOUNTER — Encounter: Payer: BLUE CROSS/BLUE SHIELD | Admitting: Internal Medicine

## 2015-02-26 ENCOUNTER — Other Ambulatory Visit: Payer: Self-pay

## 2015-02-26 DIAGNOSIS — F419 Anxiety disorder, unspecified: Secondary | ICD-10-CM

## 2015-02-26 MED ORDER — LORAZEPAM 1 MG PO TABS: 1.0000 mg | ORAL_TABLET | Freq: Two times a day (BID) | ORAL | Status: DC | PRN

## 2015-02-26 NOTE — Telephone Encounter (Signed)
Already has appt for 03/01/15

## 2015-02-26 NOTE — Telephone Encounter (Signed)
pls fax script for 30 day supply. pls call pt: Advise Script sent, would like to see her in ofc to discuss anxiety management within 30 days.

## 2015-02-26 NOTE — Telephone Encounter (Signed)
Please Advise Refill Request? Refill request for- Lorazepam 1 mg tab Last filled by MD on - 12/27/14 Last Appt - 12/27/14        Next Appt - 03/01/15 Pharmacy- [facility]

## 2015-02-28 ENCOUNTER — Other Ambulatory Visit: Payer: BLUE CROSS/BLUE SHIELD

## 2015-03-01 ENCOUNTER — Ambulatory Visit: Payer: BLUE CROSS/BLUE SHIELD | Admitting: Nurse Practitioner

## 2015-04-16 ENCOUNTER — Other Ambulatory Visit: Payer: Self-pay | Admitting: *Deleted

## 2015-04-16 DIAGNOSIS — E785 Hyperlipidemia, unspecified: Secondary | ICD-10-CM

## 2015-04-16 MED ORDER — ROSUVASTATIN CALCIUM 40 MG PO TABS: 40.0000 mg | ORAL_TABLET | Freq: Every day | ORAL | Status: DC

## 2015-04-23 ENCOUNTER — Other Ambulatory Visit: Payer: Self-pay | Admitting: Family Medicine

## 2015-04-23 DIAGNOSIS — E785 Hyperlipidemia, unspecified: Secondary | ICD-10-CM

## 2015-04-24 MED ORDER — ROSUVASTATIN CALCIUM 40 MG PO TABS: 40.0000 mg | ORAL_TABLET | Freq: Every day | ORAL | Status: DC

## 2015-05-25 ENCOUNTER — Encounter: Payer: Self-pay | Admitting: Family Medicine

## 2015-05-25 ENCOUNTER — Ambulatory Visit (INDEPENDENT_AMBULATORY_CARE_PROVIDER_SITE_OTHER): Payer: Managed Care, Other (non HMO) | Admitting: Family Medicine

## 2015-05-25 VITALS — BP 121/80 | HR 81 | Temp 98.0°F | Resp 16 | Ht 64.0 in | Wt 150.0 lb

## 2015-05-25 DIAGNOSIS — Z1239 Encounter for other screening for malignant neoplasm of breast: Secondary | ICD-10-CM

## 2015-05-25 DIAGNOSIS — R7303 Prediabetes: Secondary | ICD-10-CM

## 2015-05-25 DIAGNOSIS — E559 Vitamin D deficiency, unspecified: Secondary | ICD-10-CM

## 2015-05-25 DIAGNOSIS — Z1211 Encounter for screening for malignant neoplasm of colon: Secondary | ICD-10-CM | POA: Diagnosis not present

## 2015-05-25 DIAGNOSIS — R7309 Other abnormal glucose: Secondary | ICD-10-CM

## 2015-05-25 DIAGNOSIS — E785 Hyperlipidemia, unspecified: Secondary | ICD-10-CM

## 2015-05-25 DIAGNOSIS — F419 Anxiety disorder, unspecified: Secondary | ICD-10-CM | POA: Diagnosis not present

## 2015-05-25 DIAGNOSIS — Z0000 Encounter for general adult medical examination without abnormal findings: Secondary | ICD-10-CM | POA: Diagnosis not present

## 2015-05-25 DIAGNOSIS — F411 Generalized anxiety disorder: Secondary | ICD-10-CM | POA: Insufficient documentation

## 2015-05-25 MED ORDER — ROSUVASTATIN CALCIUM 40 MG PO TABS: 40.0000 mg | ORAL_TABLET | Freq: Every day | ORAL | Status: DC

## 2015-05-25 MED ORDER — LORAZEPAM 1 MG PO TABS: 1.0000 mg | ORAL_TABLET | Freq: Two times a day (BID) | ORAL | Status: DC | PRN

## 2015-05-25 NOTE — Patient Instructions (Signed)
Lipid profile, a1c and vitamin D future labs. Please make appt to have drawn fasting by next Wednesday. Mammogram ordered, please schedule.  Please reschedule colonoscopy.  Diabetes and Exercise Exercising regularly is important. It is not just about losing weight. It has many health benefits, such as:  Improving your overall fitness, flexibility, and endurance.  Increasing your bone density.  Helping with weight control.  Decreasing your body fat.  Increasing your muscle strength.  Reducing stress and tension.  Improving your overall health. People with diabetes who exercise gain additional benefits because exercise:  Reduces appetite.  Improves the body's use of blood sugar (glucose).  Helps lower or control blood glucose.  Decreases blood pressure.  Helps control blood lipids (such as cholesterol and triglycerides).  Improves the body's use of the hormone insulin by:  Increasing the body's insulin sensitivity.  Reducing the body's insulin needs.  Decreases the risk for heart disease because exercising:  Lowers cholesterol and triglycerides levels.  Increases the levels of good cholesterol (such as high-density lipoproteins [HDL]) in the body.  Lowers blood glucose levels. YOUR ACTIVITY PLAN  Choose an activity that you enjoy and set realistic goals. Your health care provider or diabetes educator can help you make an activity plan that works for you. Exercise regularly as directed by your health care provider. This includes:  Performing resistance training twice a week such as push-ups, sit-ups, lifting weights, or using resistance bands.  Performing 150 minutes of cardio exercises each week such as walking, running, or playing sports.  Staying active and spending no more than 90 minutes at one time being inactive. Even short bursts of exercise are good for you. Three 10-minute sessions spread throughout the day are just as beneficial as a single 30-minute  session. Some exercise ideas include:  Taking the dog for a walk.  Taking the stairs instead of the elevator.  Dancing to your favorite song.  Doing an exercise video.  Doing your favorite exercise with a friend. RECOMMENDATIONS FOR EXERCISING WITH TYPE 1 OR TYPE 2 DIABETES   Check your blood glucose before exercising. If blood glucose levels are greater than 240 mg/dL, check for urine ketones. Do not exercise if ketones are present.  Avoid injecting insulin into areas of the body that are going to be exercised. For example, avoid injecting insulin into:  The arms when playing tennis.  The legs when jogging.  Keep a record of:  Food intake before and after you exercise.  Expected peak times of insulin action.  Blood glucose levels before and after you exercise.  The type and amount of exercise you have done.  Review your records with your health care provider. Your health care provider will help you to develop guidelines for adjusting food intake and insulin amounts before and after exercising.  If you take insulin or oral hypoglycemic agents, watch for signs and symptoms of hypoglycemia. They include:  Dizziness.  Shaking.  Sweating.  Chills.  Confusion.  Drink plenty of water while you exercise to prevent dehydration or heat stroke. Body water is lost during exercise and must be replaced.  Talk to your health care provider before starting an exercise program to make sure it is safe for you. Remember, almost any type of activity is better than none. Document Released: 12/13/2003 Document Revised: 02/06/2014 Document Reviewed: 03/01/2013 [facility] Patient Information 2015 [place], [place]. This information is not intended to replace advice given to you by your health care provider. Make sure you discuss any questions you  have with your health care provider.  

## 2015-05-25 NOTE — Progress Notes (Signed)
Subjective:    Patient ID: Mikayla Tucker, female    DOB: 11/14/1960, 54 y.o.   MRN: [id number]  HPI Patient presents to office visit today for follow up on multiple chronic conditions and therapy changes. Patient is not fasting today, therefore lab work will need to be collected as a future order when fasting.  Vitamin D deficiency: Patient was found to be vitamin D deficient in February 2016 with a level of 19.59. She has since been taking supplementation daily of 5000 units of vitamin D2 daily. She reports she does not have any feelings of increasing fatigue or side effects to the vitamin D supplementation. Patient is due for vitamin D levels today, an order to recommend further therapy. Patient does get plenty of sun exposure, she owns a beach house. Patient denies any fractures.  Prediabetes: February 2016, patient's A1c was 5.8. Patient reports she eats a healthy diet, she does not eat a lot of carbohydrates or sugars. She eats plenty fresh fruits, vegetables and lean meats. She is prescribed a statin medication. Patient denies any hyper or hypoglycemic events. Patient denies any neuropathy or nonhealing wounds of her lower extremities.  Hyperlipidemia: Patient has a history of high cholesterol, she currently takes Crestor 40 mg daily and krill oil. She reports having a healthy diet, low in saturated fats. She denies any myalgias or arthralgias. Family history is unknown to her, she is adopted. Mikayla Tucker is a 54 y.o. Caucasian female presents to office visit for follow-up on prediabetes, elevated cholesterol and vitamin D deficiency after therapy change 6 months ago.  Anxiety: She reports Ativan use infrequently. She states the majority of the time when she does use added to follow sleep at night. She finds it very helpful, to relax in order to follow sleep. She experiences mind racing at the end of the day, which makes it difficult to follow sleep. Patient denies daily anxiety, or feelings of  being overwhelmed. Patient feels her anxiety rarely affects her daily life.  Health maintenance:  Colonoscopy: Unknown family history, prior history of colon polyps that were benign. Patient due for colonoscopy, and is in the process of scheduling one to be completed. Mammogram: Unknown family history patient is adopted, patient had a benign mass in her breast a few years ago.  Cervical cancer screening: Last Pap greater than 4 years ago, patient denies any abnormal Paps in the past. pt does not have a gynecologist to [place]. Immunizations: Tetanus due within the next month. Flu vaccination will be due next month. Infectious disease screening: HIV screening completed. Hepatitis C screen declined prior.   Former Smoker Past Medical History  Diagnosis Date  . Allergy   . Cancer     SCC  . Hyperlipidemia    Allergies  Allergen Reactions  . Hydrocodone-Acetaminophen Hives   Past Surgical History  Procedure Laterality Date  . Appendectomy    . Cholecystectomy    . Breast surgery      biopsy  . Back surgery    . Lipoma excision      under breast on R side  . Colonoscopy     Family History  Problem Relation Age of Onset  . Adopted: Yes  . Colon cancer Neg Hx    Social History   Social History  . Marital Status: Married    Spouse Name: N/A  . Number of Children: 1  . Years of Education: N/A   Occupational History  .      homemaker  Social History Main Topics  . Smoking status: Former Smoker -- 1.00 packs/day for 10 years  . Smokeless tobacco: Never Used     Comment: quit 54 yrs old  . Alcohol Use: 4.2 oz/week    7 Glasses of wine per week  . Drug Use: No  . Sexual Activity: Yes    Birth Control/ Protection: Post-menopausal   Other Topics Concern  . Not on file   Social History Narrative   Ms. Beshara lives with her husband and daughter. She is originally from [place]-moved to [place]. She relocated to [facility] [place] about [place] from [place].   Review of  Systems Negative, with the exception of above mentioned in HPI     Objective:   Physical Exam BP 121/80 mmHg  Pulse 81  Temp(Src) 98 F (36.7 C) (Temporal)  Resp 16  Ht 5\' 4"  (1.626 m)  Wt 150 lb (68.04 kg)  BMI 25.73 kg/m2  SpO2 98% Gen: Afebrile. No acute distress. Nontoxic in appearance well-developed, well-nourished Caucasian female. Makes good eye contact, pleasant. HENT: AT. [place]. MMM. Eyes:Pupils Equal Round Reactive to light, Extraocular movements intact, Conjunctiva without redness, discharge or icterus. Neck: Supple, no lymphadenopathy, mild fullness posterior to thyroid. CV: RRR no murmurs appreciated, no edema, +2/4 P posterior tibialis pulses Chest: CTAB, no wheeze or crackles Abd: Soft. Flat. NTND. BS present. No Masses palpated.  Psych: Normal affect, dress and demeanor, normal speech. Normal judgment thought content. Normal mood.    Assessment & Plan:  Mikayla Tucker is a 54 y.o. Caucasian female presents to office visit for follow-up on prediabetes, elevated cholesterol and vitamin D deficiency after therapy change 6 months ago.  Vitamin D deficiency: Vitamin D levels ordered, last vitamin D level 19. Patient will be called with results and medication prescribed if needed. If patient's levels are normal, would recommend 1000 mg of calcium daily along with 600- 800 iu of vitamin D daily.  Prediabetes: Last A1c was 5.8. A1c ordered today. Patient encouraged to continue eating a healthy diet, watching carbohydrates and sugar intake. Creasing her exercise to at least 150 minutes a week. Continuing her statin medication. Patient will be called with results once available, if needed medication will be started.  Hyperlipidemia: Patient not fasting this morning, future lab placed for lipid panel collection. Continue Crestor 40 mg daily and krill oil. Continue healthy diet, and had at least 150 minutes of exercise to your regimen daily.  Anxiety: Patient to continue Ativan twice a  day when necessary. She seems to be taking infrequently, and mostly for sleep when she does. Prescription given to her today. Discussion on if she is needing this multiple times a week, we would want to start her on a daily anti-anxiety medication. Patient in understanding of plan.  Health maintenance:  Colonoscopy: Unknown family history, prior history of colon polyps that were benign. Patient due for colonoscopy, and is in the process of scheduling one to be completed. Mammogram: Unknown family history patient is adopted, patient had a benign mass in her breast a few years ago. Mammogram ordered today. Cervical cancer screening: Last Pap greater than 4 years ago, patient denies any abnormal Paps in the past. pt does not have a gynecologist to [place]. Patient encouraged to schedule appointment for cervical cancer screening, at that time would offer hepatitis C screening. Immunizations: Tetanus due within the next month. Flu vaccination will be due next month, both will be offered in her follow-up appointment. Infectious disease screening: HIV screening  completed. Hepatitis C screen declined prior.   Follow-up in 6 months, unless lab irregularity requires follow-up prior.

## 2015-05-25 NOTE — Progress Notes (Signed)
Pre visit review using our clinic review tool, if applicable. No additional management support is needed unless otherwise documented below in the visit note. 

## 2015-05-28 ENCOUNTER — Other Ambulatory Visit: Payer: Self-pay | Admitting: Family Medicine

## 2015-05-28 DIAGNOSIS — E559 Vitamin D deficiency, unspecified: Secondary | ICD-10-CM

## 2015-05-28 DIAGNOSIS — R7303 Prediabetes: Secondary | ICD-10-CM

## 2015-05-28 DIAGNOSIS — E785 Hyperlipidemia, unspecified: Secondary | ICD-10-CM

## 2015-05-29 ENCOUNTER — Other Ambulatory Visit (INDEPENDENT_AMBULATORY_CARE_PROVIDER_SITE_OTHER): Payer: Managed Care, Other (non HMO)

## 2015-05-29 DIAGNOSIS — E785 Hyperlipidemia, unspecified: Secondary | ICD-10-CM

## 2015-05-29 DIAGNOSIS — R7303 Prediabetes: Secondary | ICD-10-CM

## 2015-05-29 DIAGNOSIS — E559 Vitamin D deficiency, unspecified: Secondary | ICD-10-CM | POA: Diagnosis not present

## 2015-05-29 DIAGNOSIS — R7309 Other abnormal glucose: Secondary | ICD-10-CM | POA: Diagnosis not present

## 2015-05-29 LAB — LIPID PANEL
CHOL/HDL RATIO: 4
CHOLESTEROL: 283 mg/dL — AB (ref 0–200)
HDL: 68.4 mg/dL (ref >39.00)
LDL CALC: 176 mg/dL — AB (ref 0–99)
NonHDL: 214.73
Triglycerides: 194 mg/dL — ABNORMAL HIGH (ref 0.0–149.0)
VLDL: 38.8 mg/dL (ref 0.0–40.0)

## 2015-05-29 LAB — HEMOGLOBIN A1C: Hgb A1c MFr Bld: 5.7 % (ref 4.6–6.5)

## 2015-05-29 LAB — VITAMIN D 25 HYDROXY (VIT D DEFICIENCY, FRACTURES): VITD: 40.27 ng/mL (ref 30.00–100.00)

## 2015-05-31 ENCOUNTER — Ambulatory Visit: Payer: BLUE CROSS/BLUE SHIELD | Admitting: Family Medicine

## 2015-05-31 ENCOUNTER — Telehealth: Payer: Self-pay | Admitting: Family Medicine

## 2015-05-31 NOTE — Telephone Encounter (Signed)
Left detail message with lab results for patient. Her cholesterol remains elevated despite statin therapy. Vitamin D level was normal, A1c was 5.7 which is prediabetic (her last A1c was 5.8).  - She is to continue healthy diet and exercise. - Continue statin and Krill oil.  - Family history unknown because she is adopted, may need to increase statin (to Lipitor 80).  - We'll attempt to call patient back tomorrow to discuss in more detail, was unable to reach her today and left a message on her phone.

## 2015-06-01 ENCOUNTER — Telehealth: Payer: Self-pay | Admitting: Family Medicine

## 2015-06-01 NOTE — Telephone Encounter (Signed)
Pls call pt: I had attempted last week and was unable to reach her,but left a detailed message.  Her cholesterol remains elevated despite statin therapy. Given the lask of family history (adopted), she may benefit from increased dose of statin medication. I would like her to consider Lipitor instead of the Crestor she is taking.  - Vitamin D level was normal -  A1c was 5.7 which is prediabetic (her last A1c was 5.8). She is to continue healthy diet and exercise. - If amendable to Lipitor will call this in for her.

## 2015-06-04 NOTE — Telephone Encounter (Signed)
Pt LMOM on 06/04/15 at 3:54pm returning call. Tried calling pt back on home phone as requested, number busy will try back later.

## 2015-06-04 NOTE — Telephone Encounter (Signed)
Left message for pt to call back  °

## 2015-06-05 NOTE — Telephone Encounter (Signed)
LMOM for pt to CB.  

## 2015-06-18 ENCOUNTER — Other Ambulatory Visit: Payer: Self-pay | Admitting: Family Medicine

## 2015-06-18 MED ORDER — ATORVASTATIN CALCIUM 80 MG PO TABS: 80.0000 mg | ORAL_TABLET | Freq: Every day | ORAL | Status: DC

## 2015-06-18 NOTE — Telephone Encounter (Signed)
Pt aware of results and she is okay with switching to lipitor as just recently she started having cramps and shooting pains in her legs from the generic crestor.  Please send lipitor to local [facility] for [facility] days.  Pt will CB in 2 weeks to let me know if she is doing well with lipitor so I can send in 90 day supply.  Also, pt wanted to know if it's okay for her to eat grapefruit with this statin?  Please advise.

## 2015-06-18 NOTE — Telephone Encounter (Signed)
Pt aware.  She will stop eating grapefruit daily.

## 2015-06-18 NOTE — Telephone Encounter (Signed)
Called in medication to [facility]. As far as grapefruit consumption with certain medications (including statin), yes grapefruit can cause absorption problems and liver/kidney problems when used with some medications. It is usually when consumed in large amounts or daily. Occasional consumption of grapefruit is probably ok, but I would avoid if possible to be safe.  Thanks

## 2015-06-28 ENCOUNTER — Ambulatory Visit (INDEPENDENT_AMBULATORY_CARE_PROVIDER_SITE_OTHER): Payer: Managed Care, Other (non HMO) | Admitting: Family Medicine

## 2015-06-28 ENCOUNTER — Other Ambulatory Visit: Payer: Self-pay | Admitting: Family Medicine

## 2015-06-28 ENCOUNTER — Encounter: Payer: Self-pay | Admitting: Family Medicine

## 2015-06-28 VITALS — BP 143/89 | HR 85 | Temp 97.5°F | Resp 18 | Ht 64.0 in | Wt 152.0 lb

## 2015-06-28 DIAGNOSIS — F419 Anxiety disorder, unspecified: Secondary | ICD-10-CM | POA: Diagnosis not present

## 2015-06-28 DIAGNOSIS — J302 Other seasonal allergic rhinitis: Secondary | ICD-10-CM

## 2015-06-28 DIAGNOSIS — H9201 Otalgia, right ear: Secondary | ICD-10-CM

## 2015-06-28 MED ORDER — FLUTICASONE PROPIONATE 50 MCG/ACT NA SUSP: 2.0000 | Freq: Every day | NASAL | Status: DC

## 2015-06-28 MED ORDER — LORAZEPAM 1 MG PO TABS: 1.0000 mg | ORAL_TABLET | Freq: Two times a day (BID) | ORAL | Status: DC | PRN

## 2015-06-28 MED ORDER — ATORVASTATIN CALCIUM 80 MG PO TABS: 80.0000 mg | ORAL_TABLET | Freq: Every day | ORAL | Status: DC

## 2015-06-28 NOTE — Patient Instructions (Signed)
I have called in the flonase for you to start taking. Take this in addition to your allergy medications.  Use your nasal saline a few times a day.   Seasonal allergies  Hay fever is a type of allergy that people have to things like grass, animals, or pollen from plants and flowers. It cannot be passed from one person to another. You cannot cure hay fever, but there are things that may help relieve your problems (symptoms). HOME CARE  Avoid the things that may be causing your problems.  Take all medicine as told by your doctor. GET HELP RIGHT AWAY IF:  You have asthma, a cough, and you start making whistling sounds when breathing (wheezing).  Your tongue or lips are puffy (swollen).  You have trouble breathing.  You feel lightheaded or like you will pass out (faint).  You have a fever.  Your problems are getting worse and your medicine is not helping.  Your treatment was working, but your problems have come back.  You are stuffed up (congested) and have pressure in your face.  You have a headache.  You have cold sweats. MAKE SURE YOU:  Understand these instructions.  Will watch your condition.  Will get help right away if you are not doing well or get worse. Document Released: 01/22/2011 Document Revised: 12/15/2011 Document Reviewed: 01/22/2011 [facility] Patient Information 2015 [place], [place]. This information is not intended to replace advice given to you by your health care provider. Make sure you discuss any questions you have with your health care provider.

## 2015-06-28 NOTE — Progress Notes (Signed)
   Subjective:    Patient ID: Mikayla Tucker, female    DOB: May 09, 1961, 54 y.o.   MRN: [id number]  HPI CC: right ear pain  Ear pain: Patient reports for acute visit with a 2 day history of right sharp ear pain. Patient states that she has had infections in her ears, usually the right ear about one time a year for the last few years. She is worried she is getting another ear infection. She denies ringing in the ears or hearing loss. Patient denies fever, chills, fatigue, ear drainage, sore throat, rhinorrhea, congestion, facial pressure pain, nausea, vomiting, diarrhea or rash. She denies headache. She states she normally takes an antihistaminic, but stopped that approximately 2 weeks ago.   Former Smoker Past Medical History  Diagnosis Date  . Allergy   . Cancer     SCC  . Hyperlipidemia    Allergies  Allergen Reactions  . Hydrocodone-Acetaminophen Hives    Review of Systems Negative, with the exception of above mentioned in HPI     Objective:   Physical Exam BP 143/89 mmHg  Pulse 85  Temp(Src) 97.5 F (36.4 C) (Oral)  Resp 18  Ht 5\' 4"  (1.626 m)  Wt 152 lb (68.947 kg)  BMI 26.08 kg/m2  SpO2 99% Gen: Afebrile. No acute distress. Nontoxic in appearance, well-developed, well-nourished Caucasian female. Very pleasant. HENT: AT. [place]. Bilateral TM visualized with no erythema or bulging, external auditory canal within normal limits. MMM. Bilateral nares with mild erythema, no swelling. Throat without erythema or exudates.  Eyes:Pupils Equal Round Reactive to light, Extraocular movements intact,  Conjunctiva without redness, discharge or icterus. Neck/lymp/endocrine: Supple, no lymphadenopathy CV: RRR Chest: CTAB, no wheeze or crackles     Assessment & Plan:  1. Seasonal allergies - Flonase prescribed, patient advised to restart an anti histamine - fluticasone (FLONASE) 50 MCG/ACT nasal spray; Place 2 sprays into both nostrils daily.  Dispense: 16 g; Refill: 6  2. Anxiety -  Refill on Ativan fax to mail order pharmacy - LORazepam (ATIVAN) 1 MG tablet; Take 1 tablet (1 mg total) by mouth 2 (two) times daily as needed for anxiety.  Dispense: 60 tablet; Refill: 2  3. Right ear pain - No infection, likely from allergies. Flonase prescribed, patient encouraged to restart an anti-histamine of choice.

## 2015-06-28 NOTE — Progress Notes (Signed)
Pre visit review using our clinic review tool, if applicable. No additional management support is needed unless otherwise documented below in the visit note. 

## 2015-07-23 ENCOUNTER — Other Ambulatory Visit: Payer: Self-pay

## 2015-07-23 DIAGNOSIS — Z1231 Encounter for screening mammogram for malignant neoplasm of breast: Secondary | ICD-10-CM

## 2015-08-07 ENCOUNTER — Encounter: Payer: Self-pay | Admitting: Family Medicine

## 2015-08-07 ENCOUNTER — Ambulatory Visit (INDEPENDENT_AMBULATORY_CARE_PROVIDER_SITE_OTHER): Payer: Managed Care, Other (non HMO) | Admitting: Family Medicine

## 2015-08-07 VITALS — BP 129/90 | HR 68 | Temp 97.9°F | Resp 20 | Wt 153.0 lb

## 2015-08-07 DIAGNOSIS — H8110 Benign paroxysmal vertigo, unspecified ear: Secondary | ICD-10-CM | POA: Insufficient documentation

## 2015-08-07 DIAGNOSIS — H9201 Otalgia, right ear: Secondary | ICD-10-CM

## 2015-08-07 DIAGNOSIS — H8112 Benign paroxysmal vertigo, left ear: Secondary | ICD-10-CM

## 2015-08-07 DIAGNOSIS — F411 Generalized anxiety disorder: Secondary | ICD-10-CM

## 2015-08-07 MED ORDER — CLONAZEPAM 1 MG PO TABS: 1.0000 mg | ORAL_TABLET | Freq: Two times a day (BID) | ORAL | Status: DC | PRN

## 2015-08-07 MED ORDER — MECLIZINE HCL 25 MG PO TABS: 25.0000 mg | ORAL_TABLET | Freq: Three times a day (TID) | ORAL | Status: DC | PRN

## 2015-08-07 NOTE — Progress Notes (Signed)
Subjective:    Patient ID: Mikayla Tucker, female    DOB: Sep 14, 1961, 54 y.o.   MRN: [id number]  HPI  Vertigo: Patient presents for acute office visit after experiencing dizziness 3 days ago. Patient states that she cut up in the middle of the night that the cath help and experienced a sensation that felt like the room was sideways/tilting. She feels this lasted for about 3 minutes, she laid down and away. She admits to some discomfort in her right ear, that she does not describe his pain but more just a sensation of awareness. She denies any hearing loss or ringing in the ears. She states she use peroxide in the ear on Sunday and it helped a little, but then she woke up yesterday and felt nauseated and when she tilts her head to the left the symptoms of the room shifting occurred again. She has never experienced anything like this before. She did have a mild ear infection a few weeks ago on the right side.   Insomnia/stress: Patient states that the Ativan that she had been using to help her fall asleep secondary to stress is now having the "opposite effect ". She feels like she is not able to use it any longer because it is not working well for her.  Past Medical History  Diagnosis Date  . Allergy   . Cancer ([place])     SCC  . Hyperlipidemia    Allergies  Allergen Reactions  . Hydrocodone-Acetaminophen Hives   Social History   Social History  . Marital Status: Married    Spouse Name: N/A  . Number of Children: 1  . Years of Education: N/A   Occupational History  .      homemaker   Social History Main Topics  . Smoking status: Former Smoker -- 1.00 packs/day for 10 years  . Smokeless tobacco: Never Used     Comment: quit 54 yrs old  . Alcohol Use: 4.2 oz/week    7 Glasses of wine per week  . Drug Use: No  . Sexual Activity: Yes    Birth Control/ Protection: Post-menopausal   Other Topics Concern  . Not on file   Social History Narrative   Ms. Vanevery lives with her husband  and daughter. She is originally from [place]-moved to [place]. She relocated to [facility] about [place] from [place].   Review of Systems Negative, with the exception of above mentioned in HPI     Objective:   Physical Exam BP 129/90 mmHg  Pulse 68  Temp(Src) 97.9 F (36.6 C) (Oral)  Resp 20  Wt 153 lb (69.4 kg)  SpO2 95% Gen: Afebrile. No acute distress. Nontoxic in appearance, well-developed, well-nourished, Caucasian female. HENT: AT. [place]. Bilateral TM visualized and normal in appearance. MMM. Bilateral nares mild erythema no swelling. Throat without erythema or exudates.  Eyes:Pupils Equal Round Reactive to light, Extraocular movements intact,  Conjunctiva without redness, discharge or icterus. Neck/lymp/endocrine: Supple, no lymphadenopathy, no thyromegaly CV: RRR  Neuro: Normal gait. PERLA. EOMi. Alert. Oriented x3. Cranial nerves II through XII intact. Muscle strength 5/5 upper and lower extremity. No nystagmus or reproducible symptoms on exam today. Psych: Normal affect, dress and demeanor. Normal speech. Normal thought content and judgment.    Assessment & Plan:  Right ear pain/BPPV (benign paroxysmal positional vertigo), left - Unknown etiology of right ear discomfort, bilateral ear exam normal. No hearing loss or ringing in the ears. Discussed with patient today the possibility of  allergies causing her right ear discomfort/fullness. Patient is taking over-the-counter antihistamine. - Discussed with patient inner ear issues can cause balance issues, as well as types of neurological disorders can cause ear imbalance disorder such as Mnire's disease. - Patient symptoms today seem to be more BPPV related with reproducible symptoms at home when turning her head to the left. Was unable to reproduce symptoms today in the office, and negative for nystagmus. Patient given instructions on BPPV therapy, meclizine for vertigo-like symptoms. She is to follow-up in the office if her symptoms are  increasing, she has any hearing loss or ringing in ears. If no improvement with symptoms would consider neurology referral.  Anxiety state - Continue Ativan, trial of Klonopin - Follow-up when necessary

## 2015-08-07 NOTE — Patient Instructions (Signed)
Benign Positional Vertigo Vertigo is the feeling that you or your surroundings are moving when they are not. Benign positional vertigo is the most common form of vertigo. The cause of this condition is not serious (is benign). This condition is triggered by certain movements and positions (is positional). This condition can be dangerous if it occurs while you are doing something that could endanger you or others, such as driving.  CAUSES In many cases, the cause of this condition is not known. It may be caused by a disturbance in an area of the inner ear that helps your brain to sense movement and balance. This disturbance can be caused by a viral infection (labyrinthitis), head injury, or repetitive motion. RISK FACTORS This condition is more likely to develop in:  Women.  People who are 50 years of age or older. SYMPTOMS Symptoms of this condition usually happen when you move your head or your eyes in different directions. Symptoms may start suddenly, and they usually last for less than a minute. Symptoms may include:  Loss of balance and falling.  Feeling like you are spinning or moving.  Feeling like your surroundings are spinning or moving.  Nausea and vomiting.  Blurred vision.  Dizziness.  Involuntary eye movement (nystagmus). Symptoms can be mild and cause only slight annoyance, or they can be severe and interfere with daily life. Episodes of benign positional vertigo may return (recur) over time, and they may be triggered by certain movements. Symptoms may improve over time. DIAGNOSIS This condition is usually diagnosed by medical history and a physical exam of the head, neck, and ears. You may be referred to a health care provider who specializes in ear, nose, and throat (ENT) problems (otolaryngologist) or a provider who specializes in disorders of the nervous system (neurologist). You may have additional testing, including:  MRI.  A CT scan.  Eye movement tests. Your  health care provider may ask you to change positions quickly while he or she watches you for symptoms of benign positional vertigo, such as nystagmus. Eye movement may be tested with an electronystagmogram (ENG), caloric stimulation, the Dix-Hallpike test, or the roll test.  An electroencephalogram (EEG). This records electrical activity in your brain.  Hearing tests. TREATMENT Usually, your health care provider will treat this by moving your head in specific positions to adjust your inner ear back to normal. Surgery may be needed in severe cases, but this is rare. In some cases, benign positional vertigo may resolve on its own in 2-4 weeks. HOME CARE INSTRUCTIONS Safety  Move slowly.Avoid sudden body or head movements.  Avoid driving.  Avoid operating heavy machinery.  Avoid doing any tasks that would be dangerous to you or others if a vertigo episode would occur.  If you have trouble walking or keeping your balance, try using a cane for stability. If you feel dizzy or unstable, sit down right away.  Return to your normal activities as told by your health care provider. Ask your health care provider what activities are safe for you. General Instructions  Take over-the-counter and prescription medicines only as told by your health care provider.  Avoid certain positions or movements as told by your health care provider.  Drink enough fluid to keep your urine clear or pale yellow.  Keep all follow-up visits as told by your health care provider. This is important. SEEK MEDICAL CARE IF:  You have a fever.  Your condition gets worse or you develop new symptoms.  Your family or friends   notice any behavioral changes.  Your nausea or vomiting gets worse.  You have numbness or a "pins and needles" sensation. SEEK IMMEDIATE MEDICAL CARE IF:  You have difficulty speaking or moving.  You are always dizzy.  You faint.  You develop severe headaches.  You have weakness in your  legs or arms.  You have changes in your hearing or vision.  You develop a stiff neck.  You develop sensitivity to light.   This information is not intended to replace advice given to you by your health care provider. Make sure you discuss any questions you have with your health care provider.   Document Released: 06/30/2006 Document Revised: 06/13/2015 Document Reviewed: 01/15/2015 Elsevier Interactive Patient Education 2016 Elsevier Inc.  

## 2015-08-22 ENCOUNTER — Telehealth: Payer: Self-pay | Admitting: Family Medicine

## 2015-08-22 DIAGNOSIS — H9201 Otalgia, right ear: Secondary | ICD-10-CM

## 2015-08-22 NOTE — Telephone Encounter (Signed)
Patient is still having ear pain. She is going to make an appointment at [facility] ENT. She did want to let you know she is not taking the medication that you recommended her not to. This is just an [place] -patient not requesting a CB

## 2015-08-22 NOTE — Telephone Encounter (Signed)
PT states that she will need a referral per [facility]NT.

## 2015-08-22 NOTE — Telephone Encounter (Signed)
Opened in error.  Please see previous message.

## 2015-08-22 NOTE — Telephone Encounter (Signed)
Left message on patient voice mail about referral placement for ENT

## 2015-08-22 NOTE — Telephone Encounter (Signed)
If she is still having ear discomfort, her to place a referral to [facility]NT for her.  I have placed this order today.

## 2015-08-22 NOTE — Telephone Encounter (Signed)
Dr Raoul Pitch do you want to refer patient to ENT? Please advise

## 2015-08-27 ENCOUNTER — Ambulatory Visit: Payer: Managed Care, Other (non HMO)

## 2015-10-07 DIAGNOSIS — Z86010 Personal history of colonic polyps: Secondary | ICD-10-CM

## 2015-10-07 DIAGNOSIS — Z860101 Personal history of adenomatous and serrated colon polyps: Secondary | ICD-10-CM

## 2015-10-07 HISTORY — DX: Personal history of colonic polyps: Z86.010

## 2015-10-07 HISTORY — DX: Personal history of adenomatous and serrated colon polyps: Z86.0101

## 2016-01-06 ENCOUNTER — Other Ambulatory Visit: Payer: Self-pay | Admitting: Family Medicine

## 2016-02-25 ENCOUNTER — Other Ambulatory Visit: Payer: Self-pay | Admitting: Family Medicine

## 2016-02-25 DIAGNOSIS — Z1329 Encounter for screening for other suspected endocrine disorder: Secondary | ICD-10-CM

## 2016-02-25 DIAGNOSIS — E785 Hyperlipidemia, unspecified: Secondary | ICD-10-CM

## 2016-02-25 DIAGNOSIS — R7303 Prediabetes: Secondary | ICD-10-CM

## 2016-02-25 DIAGNOSIS — Z130 Encounter for screening for diseases of the blood and blood-forming organs and certain disorders involving the immune mechanism: Secondary | ICD-10-CM

## 2016-02-25 DIAGNOSIS — Z6825 Body mass index (BMI) 25.0-25.9, adult: Secondary | ICD-10-CM

## 2016-02-25 DIAGNOSIS — Z131 Encounter for screening for diabetes mellitus: Secondary | ICD-10-CM

## 2016-02-27 ENCOUNTER — Ambulatory Visit (INDEPENDENT_AMBULATORY_CARE_PROVIDER_SITE_OTHER): Payer: Managed Care, Other (non HMO) | Admitting: Family Medicine

## 2016-02-27 ENCOUNTER — Other Ambulatory Visit: Payer: Managed Care, Other (non HMO)

## 2016-02-27 ENCOUNTER — Encounter: Payer: Self-pay | Admitting: Family Medicine

## 2016-02-27 ENCOUNTER — Other Ambulatory Visit: Payer: Self-pay | Admitting: Family Medicine

## 2016-02-27 VITALS — BP 138/89 | HR 69 | Temp 97.6°F | Resp 20 | Ht 64.0 in | Wt 149.8 lb

## 2016-02-27 DIAGNOSIS — E559 Vitamin D deficiency, unspecified: Secondary | ICD-10-CM

## 2016-02-27 DIAGNOSIS — Z23 Encounter for immunization: Secondary | ICD-10-CM

## 2016-02-27 DIAGNOSIS — Z6825 Body mass index (BMI) 25.0-25.9, adult: Secondary | ICD-10-CM | POA: Diagnosis not present

## 2016-02-27 DIAGNOSIS — Z0000 Encounter for general adult medical examination without abnormal findings: Secondary | ICD-10-CM | POA: Diagnosis not present

## 2016-02-27 DIAGNOSIS — E663 Overweight: Secondary | ICD-10-CM | POA: Insufficient documentation

## 2016-02-27 DIAGNOSIS — R7303 Prediabetes: Secondary | ICD-10-CM | POA: Diagnosis not present

## 2016-02-27 DIAGNOSIS — IMO0001 Reserved for inherently not codable concepts without codable children: Secondary | ICD-10-CM

## 2016-02-27 DIAGNOSIS — R030 Elevated blood-pressure reading, without diagnosis of hypertension: Secondary | ICD-10-CM

## 2016-02-27 DIAGNOSIS — Z1211 Encounter for screening for malignant neoplasm of colon: Secondary | ICD-10-CM | POA: Diagnosis not present

## 2016-02-27 DIAGNOSIS — E785 Hyperlipidemia, unspecified: Secondary | ICD-10-CM

## 2016-02-27 LAB — COMPREHENSIVE METABOLIC PANEL
ALBUMIN: 4.5 g/dL (ref 3.6–5.1)
ALT: 25 U/L (ref 6–29)
AST: 21 U/L (ref 10–35)
Alkaline Phosphatase: 79 U/L (ref 33–130)
BUN: 10 mg/dL (ref 7–25)
CHLORIDE: 99 mmol/L (ref 98–110)
CO2: 23 mmol/L (ref 20–31)
CREATININE: 0.53 mg/dL (ref 0.50–1.05)
Calcium: 9.7 mg/dL (ref 8.6–10.4)
Glucose, Bld: 83 mg/dL (ref 65–99)
Potassium: 4.9 mmol/L (ref 3.5–5.3)
SODIUM: 133 mmol/L — AB (ref 135–146)
TOTAL PROTEIN: 7.4 g/dL (ref 6.1–8.1)
Total Bilirubin: 0.7 mg/dL (ref 0.2–1.2)

## 2016-02-27 LAB — LIPID PANEL
CHOL/HDL RATIO: 3.4 ratio (ref <=5.0)
CHOLESTEROL: 246 mg/dL — AB (ref 125–200)
HDL: 73 mg/dL (ref >=46)
LDL Cholesterol: 145 mg/dL — ABNORMAL HIGH (ref <130)
Triglycerides: 139 mg/dL (ref <150)
VLDL: 28 mg/dL (ref <30)

## 2016-02-27 LAB — CBC WITH DIFFERENTIAL/PLATELET
BASOS PCT: 1 %
Basophils Absolute: 55 cells/uL (ref 0–200)
EOS ABS: 165 {cells}/uL (ref 15–500)
Eosinophils Relative: 3 %
HCT: 41.5 % (ref 35.0–45.0)
Hemoglobin: 14 g/dL (ref 11.7–15.5)
LYMPHS ABS: 2090 {cells}/uL (ref 850–3900)
LYMPHS PCT: 38 %
MCH: 34.6 pg — AB (ref 27.0–33.0)
MCHC: 33.7 g/dL (ref 32.0–36.0)
MCV: 102.5 fL — AB (ref 80.0–100.0)
MPV: 10 fL (ref 7.5–12.5)
Monocytes Absolute: 330 cells/uL (ref 200–950)
Monocytes Relative: 6 %
NEUTROS PCT: 52 %
Neutro Abs: 2860 cells/uL (ref 1500–7800)
PLATELETS: 294 10*3/uL (ref 140–400)
RBC: 4.05 MIL/uL (ref 3.80–5.10)
RDW: 13.1 % (ref 11.0–15.0)
WBC: 5.5 10*3/uL (ref 3.8–10.8)

## 2016-02-27 LAB — HEMOGLOBIN A1C
Hgb A1c MFr Bld: 5.7 % — ABNORMAL HIGH (ref <5.7)
Mean Plasma Glucose: 117 mg/dL

## 2016-02-27 LAB — TSH: TSH: 0.84 m[IU]/L

## 2016-02-27 NOTE — Patient Instructions (Signed)
Health Maintenance, Female Adopting a healthy lifestyle and getting preventive care can go a long way to promote health and wellness. Talk with your health care provider about what schedule of regular examinations is right for you. This is a good chance for you to check in with your provider about disease prevention and staying healthy. In between checkups, there are plenty of things you can do on your own. Experts have done a lot of research about which lifestyle changes and preventive measures are most likely to keep you healthy. Ask your health care provider for more information. WEIGHT AND DIET  Eat a healthy diet  Be sure to include plenty of vegetables, fruits, low-fat dairy products, and lean protein.  Do not eat a lot of foods high in solid fats, added sugars, or salt.  Get regular exercise. This is one of the most important things you can do for your health.  Most adults should exercise for at least 150 minutes each week. The exercise should increase your heart rate and make you sweat (moderate-intensity exercise).  Most adults should also do strengthening exercises at least twice a week. This is in addition to the moderate-intensity exercise.  Maintain a healthy weight  Body mass index (BMI) is a measurement that can be used to identify possible weight problems. It estimates body fat based on height and weight. Your health care provider can help determine your BMI and help you achieve or maintain a healthy weight.  For females 20 years of age and older:   A BMI below 18.5 is considered underweight.  A BMI of 18.5 to 24.9 is normal.  A BMI of 25 to 29.9 is considered overweight.  A BMI of 30 and above is considered obese.  Watch levels of cholesterol and blood lipids  You should start having your blood tested for lipids and cholesterol at 54 years of age, then have this test every 5 years.  You may need to have your cholesterol levels checked more often if:  Your lipid  or cholesterol levels are high.  You are older than 54 years of age.  You are at high risk for heart disease.  CANCER SCREENING   Lung Cancer  Lung cancer screening is recommended for adults 55-80 years old who are at high risk for lung cancer because of a history of smoking.  A yearly low-dose CT scan of the lungs is recommended for people who:  Currently smoke.  Have quit within the past 15 years.  Have at least a 30-pack-year history of smoking. A pack year is smoking an average of one pack of cigarettes a day for 1 year.  Yearly screening should continue until it has been 15 years since you quit.  Yearly screening should stop if you develop a health problem that would prevent you from having lung cancer treatment.  Breast Cancer  Practice breast self-awareness. This means understanding how your breasts normally appear and feel.  It also means doing regular breast self-exams. Let your health care provider know about any changes, no matter how small.  If you are in your 20s or 30s, you should have a clinical breast exam (CBE) by a health care provider every 1-3 years as part of a regular health exam.  If you are 40 or older, have a CBE every year. Also consider having a breast X-ray (mammogram) every year.  If you have a family history of breast cancer, talk to your health care provider about genetic screening.  If you   are at high risk for breast cancer, talk to your health care provider about having an MRI and a mammogram every year.  Breast cancer gene (BRCA) assessment is recommended for women who have family members with BRCA-related cancers. BRCA-related cancers include:  Breast.  Ovarian.  Tubal.  Peritoneal cancers.  Results of the assessment will determine the need for genetic counseling and BRCA1 and BRCA2 testing. Cervical Cancer Your health care provider may recommend that you be screened regularly for cancer of the pelvic organs (ovaries, uterus, and  vagina). This screening involves a pelvic examination, including checking for microscopic changes to the surface of your cervix (Pap test). You may be encouraged to have this screening done every 3 years, beginning at age 21.  For women ages 30-65, health care providers may recommend pelvic exams and Pap testing every 3 years, or they may recommend the Pap and pelvic exam, combined with testing for human papilloma virus (HPV), every 5 years. Some types of HPV increase your risk of cervical cancer. Testing for HPV may also be done on women of any age with unclear Pap test results.  Other health care providers may not recommend any screening for nonpregnant women who are considered low risk for pelvic cancer and who do not have symptoms. Ask your health care provider if a screening pelvic exam is right for you.  If you have had past treatment for cervical cancer or a condition that could lead to cancer, you need Pap tests and screening for cancer for at least 20 years after your treatment. If Pap tests have been discontinued, your risk factors (such as having a new sexual partner) need to be reassessed to determine if screening should resume. Some women have medical problems that increase the chance of getting cervical cancer. In these cases, your health care provider may recommend more frequent screening and Pap tests. Colorectal Cancer  This type of cancer can be detected and often prevented.  Routine colorectal cancer screening usually begins at 54 years of age and continues through 54 years of age.  Your health care provider may recommend screening at an earlier age if you have risk factors for colon cancer.  Your health care provider may also recommend using home test kits to check for hidden blood in the stool.  A small camera at the end of a tube can be used to examine your colon directly (sigmoidoscopy or colonoscopy). This is done to check for the earliest forms of colorectal  cancer.  Routine screening usually begins at age 50.  Direct examination of the colon should be repeated every 5-10 years through 54 years of age. However, you may need to be screened more often if early forms of precancerous polyps or small growths are found. Skin Cancer  Check your skin from head to toe regularly.  Tell your health care provider about any new moles or changes in moles, especially if there is a change in a mole's shape or color.  Also tell your health care provider if you have a mole that is larger than the size of a pencil eraser.  Always use sunscreen. Apply sunscreen liberally and repeatedly throughout the day.  Protect yourself by wearing long sleeves, pants, a wide-brimmed hat, and sunglasses whenever you are outside. HEART DISEASE, DIABETES, AND HIGH BLOOD PRESSURE   High blood pressure causes heart disease and increases the risk of stroke. High blood pressure is more likely to develop in:  People who have blood pressure in the high end   of the normal range (130-139/85-89 mm Hg).  People who are overweight or obese.  People who are African American.  If you are 38-23 years of age, have your blood pressure checked every 3-5 years. If you are 61 years of age or older, have your blood pressure checked every year. You should have your blood pressure measured twice--once when you are at a hospital or clinic, and once when you are not at a hospital or clinic. Record the average of the two measurements. To check your blood pressure when you are not at a hospital or clinic, you can use:  An automated blood pressure machine at a pharmacy.  A home blood pressure monitor.  If you are between 45 years and 39 years old, ask your health care provider if you should take aspirin to prevent strokes.  Have regular diabetes screenings. This involves taking a blood sample to check your fasting blood sugar level.  If you are at a normal weight and have a low risk for diabetes,  have this test once every three years after 54 years of age.  If you are overweight and have a high risk for diabetes, consider being tested at a younger age or more often. PREVENTING INFECTION  Hepatitis B  If you have a higher risk for hepatitis B, you should be screened for this virus. You are considered at high risk for hepatitis B if:  You were born in a country where hepatitis B is common. Ask your health care provider which countries are considered high risk.  Your parents were born in a high-risk country, and you have not been immunized against hepatitis B (hepatitis B vaccine).  You have HIV or AIDS.  You use needles to inject street drugs.  You live with someone who has hepatitis B.  You have had sex with someone who has hepatitis B.  You get hemodialysis treatment.  You take certain medicines for conditions, including cancer, organ transplantation, and autoimmune conditions. Hepatitis C  Blood testing is recommended for:  Everyone born from [birth year] through 1964.  Anyone with known risk factors for hepatitis C. Sexually transmitted infections (STIs)  You should be screened for sexually transmitted infections (STIs) including gonorrhea and chlamydia if:  You are sexually active and are younger than 54 years of age.  You are older than 53 years of age and your health care provider tells you that you are at risk for this type of infection.  Your sexual activity has changed since you were last screened and you are at an increased risk for chlamydia or gonorrhea. Ask your health care provider if you are at risk.  If you do not have HIV, but are at risk, it may be recommended that you take a prescription medicine daily to prevent HIV infection. This is called pre-exposure prophylaxis (PrEP). You are considered at risk if:  You are sexually active and do not regularly use condoms or know the HIV status of your partner(s).  You take drugs by injection.  You are sexually  active with a partner who has HIV. Talk with your health care provider about whether you are at high risk of being infected with HIV. If you choose to begin PrEP, you should first be tested for HIV. You should then be tested every 3 months for as long as you are taking PrEP.  PREGNANCY   If you are premenopausal and you may become pregnant, ask your health care provider about preconception counseling.  If you may  become pregnant, take 400 to 800 micrograms (mcg) of folic acid every day.  If you want to prevent pregnancy, talk to your health care provider about birth control (contraception). OSTEOPOROSIS AND MENOPAUSE   Osteoporosis is a disease in which the bones lose minerals and strength with aging. This can result in serious bone fractures. Your risk for osteoporosis can be identified using a bone density scan.  If you are 38 years of age or older, or if you are at risk for osteoporosis and fractures, ask your health care provider if you should be screened.  Ask your health care provider whether you should take a calcium or vitamin D supplement to lower your risk for osteoporosis.  Menopause may have certain physical symptoms and risks.  Hormone replacement therapy may reduce some of these symptoms and risks. Talk to your health care provider about whether hormone replacement therapy is right for you.  HOME CARE INSTRUCTIONS   Schedule regular health, dental, and eye exams.  Stay current with your immunizations.   Do not use any tobacco products including cigarettes, chewing tobacco, or electronic cigarettes.  If you are pregnant, do not drink alcohol.  If you are breastfeeding, limit how much and how often you drink alcohol.  Limit alcohol intake to no more than 1 drink per day for nonpregnant women. One drink equals 12 ounces of beer, 5 ounces of wine, or 1 ounces of hard liquor.  Do not use street drugs.  Do not share needles.  Ask your health care provider for help if  you need support or information about quitting drugs.  Tell your health care provider if you often feel depressed.  Tell your health care provider if you have ever been abused or do not feel safe at home.   This information is not intended to replace advice given to you by your health care provider. Make sure you discuss any questions you have with your health care provider.   Document Released: 04/07/2011 Document Revised: 10/13/2014 Document Reviewed: 08/24/2013 Elsevier Interactive Patient Education [employer].

## 2016-02-27 NOTE — Progress Notes (Signed)
Patient ID: Mikayla Tucker, female   DOB: Dec 15, 1960, 55 y.o.   MRN: [id number]       Patient ID: Mikayla Tucker, female  DOB: Feb 01, 1961, 55 y.o.   MRN: [id number]  Subjective:  Mikayla Tucker is a 55 y.o. female present for preventive/annual visit.  All past medical history, surgical history, allergies, family history, immunizations, medications and social history were updated in the electronic medical record today. All recent labs, ED visits and hospitalizations within the last year were reviewed.  Patient Care Team    Relationship Specialty Notifications Start End  Ma Hillock, DO PCP - General Family Medicine  08/07/15   Kennon Holter, NP Nurse Practitioner Nurse Practitioner  02/27/16    Comment: Gynecology     Health maintenance:  Colonoscopy: Unknown family history, prior history of colon polyps that were benign. Patient due for colonoscopy, and is in the process of scheduling one to be completed Velora Heckler), but needs new referral.  Mammogram: Unknown family history patient is adopted, patient had a benign mass in her breast a few years ago. [facility] Starting SBE. Cervical cancer screening: 02/2016 PAP [facility] at [facility].  Patient denies any abnormal Paps in the past. pt does not have a gynecologist to [place]. Immunizations: Tetanus due, and administered today.  Flu vaccination yearly.  Infectious disease screening: HIV screening completed. Hepatitis C collected today Assistive device: None Oxygen [id number] Patient has a Dental home. Hospitalizations/ED visits: none  Past Medical History  Diagnosis Date  . Allergy   . Cancer ([place])     SCC  . Hyperlipidemia    Allergies  Allergen Reactions  . Hydrocodone-Acetaminophen Hives   Past Surgical History  Procedure Laterality Date  . Appendectomy    . Cholecystectomy    . Breast surgery      biopsy  . Back surgery    . Lipoma excision      under breast on  R side  . Colonoscopy     Family History  Problem Relation Age of Onset  . Adopted: Yes  . Colon cancer Neg Hx    Social History   Social History  . Marital Status: Married    Spouse Name: N/A  . Number of Children: 1  . Years of Education: N/A   Occupational History  .      homemaker   Social History Main Topics  . Smoking status: Former Smoker -- 1.00 packs/day for 10 years  . Smokeless tobacco: Never Used     Comment: quit 55 yrs old  . Alcohol Use: 4.2 oz/week    7 Glasses of wine per week  . Drug Use: No  . Sexual Activity: Yes    Birth Control/ Protection: Post-menopausal   Other Topics Concern  . Not on file   Social History Narrative   Mikayla Tucker lives with her husband and daughter. She is originally from [place]-moved to [place]. She relocated to [facility] about [place] from [place].     Medication List       This list is accurate as of: 02/27/16  2:55 PM.  Always use your most recent med list.               atorvastatin 80 MG tablet  Commonly known as:  LIPITOR  TAKE 1 TABLET DAILY     Cholecalciferol 1000 units tablet  Take 5,000 Units by mouth daily.     fexofenadine 180 MG tablet  Commonly known as:  ALLEGRA  Take 180 mg by mouth daily.     fluticasone 50 MCG/ACT nasal spray  Commonly known as:  FLONASE  Place 2 sprays into both nostrils daily.     KRILL OIL OMEGA-3 PO  Take by mouth.     LORazepam 1 MG tablet  Commonly known as:  ATIVAN     meclizine 25 MG tablet  Commonly known as:  ANTIVERT  Take 1 tablet (25 mg total) by mouth 3 (three) times daily as needed for dizziness or nausea.         ROS: Negative, with the exception of above mentioned in HPI  Objective: BP 138/89 mmHg  Pulse 69  Temp(Src) 97.6 F (36.4 C) (Oral)  Resp 20  Ht 5\' 4"  (1.626 m)  Wt 149 lb 12 oz (67.926 kg)  BMI 25.69 kg/m2  SpO2 98% Gen: Afebrile. No acute distress. Nontoxic in appearance, well-developed, well-nourished, caucasian female, very  pleasant HENT: AT. [place]. Bilateral TM visualized and normal in appearance, normal external auditory canal. MMM, no oral lesions, good dentition. Bilateral nares without erythema or swelling. Throat  erythema, ulcerations or exudates. No Cough on exam, no hoarseness on exam. Eyes:Pupils Equal Round Reactive to light, Extraocular movements intact,  Conjunctiva without redness, discharge or icterus. Neck/lymp/endocrine: Supple, no lymphadenopathy, no thyromegaly CV: RRR no murmur appreciated, no edema, +2/4 P posterior tibialis pulses.  Chest: CTAB, no wheeze, rhonchi or crackles. Normal Respiratory effort. Good Air movement. Abd: Soft. Round. NTND. BS present. No Masses palpated. No hepatosplenomegaly. No rebound tenderness or guarding. Skin: No rashes, purpura or petechiae. Warm and well-perfused. Skin intact. Neuro/Msk:  Normal gait. PERLA. EOMi. Alert. Oriented x3.  Cranial nerves II through XII intact. Muscle strength 5/5 upper and lower extremity. DTRs equal bilaterally. Psych: Normal affect, dress and demeanor. Normal speech. Normal thought content and judgment.  Assessment/plan: Mikayla Tucker is a 55 y.o. female present for annual exam.  Encounter for preventive health examination Colonoscopy: Unknown family history, prior history of colon polyps that were benign. Patient due for colonoscopy, and is in the process of scheduling one to be completed Velora Heckler), but needs new referral.  Mammogram: Unknown family history patient is adopted, patient had a benign mass in her breast a few years ago. [facility] Starting SBE. Cervical cancer screening: 02/2016 PAP [facility].  Patient denies any abnormal Paps in the past. pt does not have a gynecologist to [place]. Immunizations: Tetanus due, and administered today.  Flu vaccination yearly.  Infectious disease screening: HIV screening completed. Hepatitis C collected today Patient  was encouraged to exercise greater than 150 minutes a week. Patient was encouraged to choose a diet filled with fresh fruits and vegetables, and lean meats. AVS provided to patient today for education/recommendation on gender specific health and safety maintenance.  Dyslipidemia - Lipid panel - TSH - Continue atorvastatin 80. Repeat lipid panel today to monitor if improved after increased dose. - Continue exercise and dietary changes.  Colon cancer screening - No screening and no known family history secondary to patient being adopted. Patient is asking for another referral today. She had to cancel the last referral secondary to changes in her husband's insurance plan. - Gastroenterology referral placed today  Vitamin D deficiency - Retest vitamin D levels today - Patient is taking 5000 units of vitamin D daily secondary to prior vitamin D deficiency. Need to make certain she is not over supplementing and that  her levels are appropriate.  Prediabetes - Request A1c today. Patient advised on diet and exercise modifications. She is on a statin. If remains in prediabetes range will offer nutrition therapy.  Elevated BP - TSH,A1C - Sized patient her blood pressure has been borderline, without elevation to treat with medication. She is advised her to eat a low-salt diet, and increase her exercise program. - continue to monitor closely   Return in about 6 months (around 08/29/2016) for hyperlipidemia.  Electronically signed by: Howard Pouch, DO [place]

## 2016-02-28 ENCOUNTER — Telehealth: Payer: Self-pay | Admitting: Family Medicine

## 2016-02-28 DIAGNOSIS — D7589 Other specified diseases of blood and blood-forming organs: Secondary | ICD-10-CM | POA: Insufficient documentation

## 2016-02-28 LAB — HEPATITIS C ANTIBODY: HCV Ab: NEGATIVE

## 2016-02-28 LAB — VITAMIN D 25 HYDROXY (VIT D DEFICIENCY, FRACTURES): VIT D 25 HYDROXY: 53 ng/mL (ref 30–100)

## 2016-02-28 NOTE — Telephone Encounter (Signed)
Please call pt: - her cholesterol is improved from prior, but still higher than desired LDL. The increased dose of statin has helped and her good cholesterol is even better. Continue diet and exercise modifications also.  -  I would like to test her  b12 and folate, I have placed these labs for collection. Her red blood cells are mildly larger than average, and this could be a cause. She does not need to make a provider appt for this. If we need to discuss or seek further treatment/eval after above collection, we will have her follow up then.  - Her vitamin D: She could tolerate a lower dose, her vitamin D is 53. Please have her lower her dose to 3000u daily (was taking 5000u). - A1c still just a little high, same as last year 5.7. - all other labs consistent with prior collections.

## 2016-02-28 NOTE — Telephone Encounter (Signed)
Spoke with patient reviewed lab results and instructions. Scheduled patient for lab appt.

## 2016-02-29 ENCOUNTER — Encounter: Payer: Self-pay | Admitting: Internal Medicine

## 2016-03-04 NOTE — Telephone Encounter (Signed)
I spoke to [facility] and most labs were able to be added to recent labs.  I was able to add methymalonic acid, folate, and B12.  I left a detailed message to cancel her lab appointment 03/06/16 and wait to hear results before rescheduling if needed.

## 2016-03-05 LAB — FOLATE: Folate: 20.5 ng/mL (ref >5.4)

## 2016-03-05 LAB — VITAMIN B12: VITAMIN B 12: 333 pg/mL (ref 200–1100)

## 2016-03-06 ENCOUNTER — Telehealth: Payer: Self-pay | Admitting: Family Medicine

## 2016-03-06 ENCOUNTER — Other Ambulatory Visit: Payer: Managed Care, Other (non HMO)

## 2016-03-06 LAB — METHYLMALONIC ACID, SERUM

## 2016-03-06 NOTE — Telephone Encounter (Signed)
Please call pt: - I would like to have a provider appt with her to discuss further work up for her abnormality in her blood cell size. We were unable to run all the labs I desired on her additional studies, so we will also need to collect blood that day. What did collect in addition looked normal (B12/folate) and I will explain in detail at her appt.

## 2016-03-06 NOTE — Telephone Encounter (Signed)
Spoke with patient scheduled her for 03/10/16 to review lab results with Dr Raoul Pitch.

## 2016-03-10 ENCOUNTER — Encounter: Payer: Self-pay | Admitting: Family Medicine

## 2016-03-10 ENCOUNTER — Ambulatory Visit (INDEPENDENT_AMBULATORY_CARE_PROVIDER_SITE_OTHER): Payer: Managed Care, Other (non HMO) | Admitting: Family Medicine

## 2016-03-10 ENCOUNTER — Other Ambulatory Visit: Payer: Self-pay | Admitting: Family Medicine

## 2016-03-10 VITALS — BP 138/89 | HR 66 | Temp 98.0°F | Resp 20 | Ht 64.0 in | Wt 149.8 lb

## 2016-03-10 DIAGNOSIS — D7589 Other specified diseases of blood and blood-forming organs: Secondary | ICD-10-CM

## 2016-03-10 DIAGNOSIS — F1010 Alcohol abuse, uncomplicated: Secondary | ICD-10-CM

## 2016-03-10 LAB — CBC WITH DIFFERENTIAL/PLATELET
BASOS ABS: 64 {cells}/uL (ref 0–200)
Basophils Relative: 1 %
EOS PCT: 3 %
Eosinophils Absolute: 192 cells/uL (ref 15–500)
HCT: 41.5 % (ref 35.0–45.0)
Hemoglobin: 14.2 g/dL (ref 11.7–15.5)
LYMPHS PCT: 39 %
Lymphs Abs: 2496 cells/uL (ref 850–3900)
MCH: 34.5 pg — ABNORMAL HIGH (ref 27.0–33.0)
MCHC: 34.2 g/dL (ref 32.0–36.0)
MCV: 100.7 fL — ABNORMAL HIGH (ref 80.0–100.0)
MONOS PCT: 6 %
MPV: 10.4 fL (ref 7.5–12.5)
Monocytes Absolute: 384 cells/uL (ref 200–950)
NEUTROS ABS: 3264 {cells}/uL (ref 1500–7800)
Neutrophils Relative %: 51 %
PLATELETS: 338 10*3/uL (ref 140–400)
RBC: 4.12 MIL/uL (ref 3.80–5.10)
RDW: 13 % (ref 11.0–15.0)
WBC: 6.4 10*3/uL (ref 3.8–10.8)

## 2016-03-10 NOTE — Patient Instructions (Signed)
Add B12 to your supplement.  Add thaimine I will call you with labs once available.  Avoid daily alcohol.  Alcohol Use Disorder Alcohol use disorder is a mental disorder. It is not a one-time incident of heavy drinking. Alcohol use disorder is the excessive and uncontrollable use of alcohol over time that leads to problems with functioning in one or more areas of daily living. People with this disorder risk harming themselves and others when they drink to excess. Alcohol use disorder also can cause other mental disorders, such as mood and anxiety disorders, and serious physical problems. People with alcohol use disorder often misuse other drugs.  Alcohol use disorder is common and widespread. Some people with this disorder drink alcohol to cope with or escape from negative life events. Others drink to relieve chronic pain or symptoms of mental illness. People with a family history of alcohol use disorder are at higher risk of losing control and using alcohol to excess.  Drinking too much alcohol can cause injury, accidents, and health problems. One drink can be too much when you are:  Working.  Pregnant or breastfeeding.  Taking medicines. Ask your doctor.  Driving or planning to drive. SYMPTOMS  Signs and symptoms of alcohol use disorder may include the following:   Consumption ofalcohol inlarger amounts or over a longer period of time than intended.  Multiple unsuccessful attempts to cutdown or control alcohol use.   A great deal of time spent obtaining alcohol, using alcohol, or recovering from the effects of alcohol (hangover).  A strong desire or urge to use alcohol (cravings).   Continued use of alcohol despite problems at work, school, or home because of alcohol use.   Continued use of alcohol despite problems in relationships because of alcohol use.  Continued use of alcohol in situations when it is physically hazardous, such as driving a car.  Continued use of alcohol  despite awareness of a physical or psychological problem that is likely related to alcohol use. Physical problems related to alcohol use can involve the brain, heart, liver, stomach, and intestines. Psychological problems related to alcohol use include intoxication, depression, anxiety, psychosis, delirium, and dementia.   The need for increased amounts of alcohol to achieve the same desired effect, or a decreased effect from the consumption of the same amount of alcohol (tolerance).  Withdrawal symptoms upon reducing or stopping alcohol use, or alcohol use to reduce or avoid withdrawal symptoms. Withdrawal symptoms include:  Racing heart.  Hand tremor.  Difficulty sleeping.  Nausea.  Vomiting.  Hallucinations.  Restlessness.  Seizures. DIAGNOSIS Alcohol use disorder is diagnosed through an assessment by your health care provider. Your health care provider may start by asking three or four questions to screen for excessive or problematic alcohol use. To confirm a diagnosis of alcohol use disorder, at least two symptoms must be present within a 65-month period. The severity of alcohol use disorder depends on the number of symptoms:  Mild--two or three.  Moderate--four or five.  Severe--six or more. Your health care provider may perform a physical exam or use results from lab tests to see if you have physical problems resulting from alcohol use. Your health care provider may refer you to a mental health professional for evaluation. TREATMENT  Some people with alcohol use disorder are able to reduce their alcohol use to low-risk levels. Some people with alcohol use disorder need to quit drinking alcohol. When necessary, mental health professionals with specialized training in substance use treatment can help. Your  health care provider can help you decide how severe your alcohol use disorder is and what type of treatment you need. The following forms of treatment are available:    Detoxification. Detoxification involves the use of prescription medicines to prevent alcohol withdrawal symptoms in the first week after quitting. This is important for people with a history of symptoms of withdrawal and for heavy drinkers who are likely to have withdrawal symptoms. Alcohol withdrawal can be dangerous and, in severe cases, cause death. Detoxification is usually provided in a hospital or in-patient substance use treatment facility.  Counseling or talk therapy. Talk therapy is provided by substance use treatment counselors. It addresses the reasons people use alcohol and ways to keep them from drinking again. The goals of talk therapy are to help people with alcohol use disorder find healthy activities and ways to cope with life stress, to identify and avoid triggers for alcohol use, and to handle cravings, which can cause relapse.  Medicines.Different medicines can help treat alcohol use disorder through the following actions:  Decrease alcohol cravings.  Decrease the positive reward response felt from alcohol use.  Produce an uncomfortable physical reaction when alcohol is used (aversion therapy).  Support groups. Support groups are run by people who have quit drinking. They provide emotional support, advice, and guidance. These forms of treatment are often combined. Some people with alcohol use disorder benefit from intensive combination treatment provided by specialized substance use treatment centers. Both inpatient and outpatient treatment programs are available.   This information is not intended to replace advice given to you by your health care provider. Make sure you discuss any questions you have with your health care provider.   Document Released: 10/30/2004 Document Revised: 10/13/2014 Document Reviewed: 12/30/2012 Elsevier Interactive Patient Education 2016 [employer].  Alcohol Withdrawal Alcohol withdrawal is a group of symptoms that can develop when a  person who drinks heavily and regularly stops drinking or drinks less. CAUSES Heavy and regular drinking can cause chemicals that send signals from the brain to the body (neurotransmitters) to deactivate. Alcohol withdrawal develops when deactivated neurotransmitters reactivate because a person stops drinking or drinks less. RISK FACTORS The more a person drinks and the longer he or she drinks, the greater the risk of alcohol withdrawal. Severe withdrawal is more likely to develop in someone who:  Had severe alcohol withdrawal in the past.  Had a seizure during a previous episode of alcohol withdrawal.  Is elderly.  Is pregnant.  Has been abusing drugs.  Has other medical problems, including:  Infection.  Heart, lung, or liver disease.  Seizures.  Mental health problems. SYMPTOMS Symptoms of this condition can be mild to moderate, or they can be severe. Mild to moderate symptoms may include:  Fatigue.  Nightmares.  Trouble sleeping.  Depression.  Anxiety.  Inability to think clearly.  Mood swings.  Irritability.  Loss of appetite.  Nausea or vomiting.  Clammy skin.  Extreme sweating.  Rapid heartbeat.  Shakiness.  Uncontrollable shaking (tremor). Severe symptoms may include:  Fever.  Seizures.  Severeconfusion.  Feeling or seeing things that are not there (hallucinations). Symptoms usually begin within eight hours after a person stops drinking or drinks less. They can last for weeks. DIAGNOSIS Alcohol withdrawal is diagnosed with a medical history and physical exam. Sometimes, urine and blood tests are also done. TREATMENT Treatment may involve:  Monitoring blood pressure, pulse, and breathing.  Getting fluids through an IV tube.  Medicine to reduce anxiety.  Medicine to prevent  or control seizures.  Multivitamins and B vitamins.  Having a health care provider check on you daily. If symptoms are moderate to severe or if there is a  risk of severe withdrawal, treatment may be done at a hospital or treatment center. HOME CARE INSTRUCTIONS  Take medicines and vitamin supplements only as directed by your health care provider.  Do not drink alcohol.  Have someone stay with you or be available if you need help.  Drink enough fluid to keep your urine clear or pale yellow.  Consider joining a 12-step program or another alcohol support group. SEEK MEDICAL CARE IF:  Your symptoms get worse or do not go away.  You cannot keep food or water in your stomach.  You are struggling with not drinking alcohol.  You cannot stop drinking alcohol. SEEK IMMEDIATE MEDICAL CARE IF:   You have an irregular heartbeat.  You have chest pain.  You have trouble breathing.  You have symptoms of severe withdrawal, such as:  A fever.  Seizures.  Severe confusion.  Hallucinations.   This information is not intended to replace advice given to you by your health care provider. Make sure you discuss any questions you have with your health care provider.   Document Released: 07/02/2005 Document Revised: 10/13/2014 Document Reviewed: 07/11/2014 Elsevier Interactive Patient Education [employer].

## 2016-03-10 NOTE — Progress Notes (Signed)
Patient ID: Mikayla Tucker, female   DOB: 01/11/1961, 55 y.o.   MRN: [id number]    Mikayla Tucker , May 17, 1961, 55 y.o., female MRN: [id number]  CC: macrocytosis without anemia Subjective: Macrocytosis without anemia: Patient seen today to discuss macrocytosis without anemia for two current blood collections. B12 333, folate 20. MMA and floate RBC was ordered, however lab was unable to run. Patient questioned today on other possible causes of elevated MCV. Thyroid is functioning normal. She is adopted, therefore family history is not available. Patient endorses daily alcohol consumption of about 1 bottle of wine daily. She and her husband drink nightly. She does not feel she has a dependence on alcohol. She does not feel she should have a problem cutting back on alcohol. She states it has been "sometime" since she went without alcohol. She denies unintentional weight loss, fever, night sweats.   Cage questionnaire:  Have you ever felt you should cut down on your drinking? yes Have people annoyed you by criticizing your drinking? No Have you ever felt bad or guilty about your drinking? No Have you ever had a drink first thing in the morning to steady your nerves or to get rid of a hangover (eye opener)? No   Allergies  Allergen Reactions  . Hydrocodone-Acetaminophen Hives   Social History  Substance Use Topics  . Smoking status: Former Smoker -- 1.00 packs/day for 10 years  . Smokeless tobacco: Never Used     Comment: quit 55 yrs old  . Alcohol Use: 4.2 oz/week    7 Glasses of wine per week   Past Medical History  Diagnosis Date  . Allergy   . Cancer ([place])     SCC  . Hyperlipidemia    Past Surgical History  Procedure Laterality Date  . Appendectomy    . Cholecystectomy    . Breast surgery      biopsy  . Back surgery    . Lipoma excision      under breast on R side  . Colonoscopy     Family History  Problem Relation Age of Onset  . Adopted: Yes  . Colon cancer Neg Hx        Medication List       This list is accurate as of: 03/10/16  2:05 PM.  Always use your most recent med list.               atorvastatin 80 MG tablet  Commonly known as:  LIPITOR  TAKE 1 TABLET DAILY     Cholecalciferol 1000 units tablet  Take 5,000 Units by mouth daily.     fexofenadine 180 MG tablet  Commonly known as:  ALLEGRA  Take 180 mg by mouth daily.     fluticasone 50 MCG/ACT nasal spray  Commonly known as:  FLONASE  Place 2 sprays into both nostrils daily.     KRILL OIL OMEGA-3 PO  Take by mouth.     LORazepam 1 MG tablet  Commonly known as:  ATIVAN     meclizine 25 MG tablet  Commonly known as:  ANTIVERT  Take 1 tablet (25 mg total) by mouth 3 (three) times daily as needed for dizziness or nausea.        ROS: Negative, with the exception of above mentioned in HPI  Objective:  BP 138/89 mmHg  Pulse 66  Temp(Src) 98 F (36.7 C) (Oral)  Resp 20  Ht 5\' 4"  (1.626 m)  Wt 149 lb 12 oz (67.926 kg)  BMI 25.69 kg/m2  SpO2 98% Body mass index is 25.69 kg/(m^2). Gen: Afebrile. No acute distress. Nontoxic in appearance, well developed, well nourished female. Very pleasant.  HENT: AT. [place]. . MMM, no oral lesions.  Eyes:Pupils Equal Round Reactive to light, Extraocular movements intact,  Conjunctiva without redness, discharge or icterus. CV: RRR  Chest: CTAB, no wheeze or crackles.  Skin: No rashes, purpura or petechiae.  Neuro: Normal gait. PERLA. EOMi. Alert. Oriented x3. Psych: Normal affect, dress and demeanor. Normal speech. Normal thought content and judgment..   Assessment/Plan: Noy Lilla is a 55 y.o. female present for acute OV for  Macrocytosis without anemia/ Alcohol abuse, daily use - In depth discussion on causes of macrocytosis. Will complete blood work that was not obtained prior, and add B1 and pathologist review.   - Patient to decrease alcohol consumption to one glass every other day, and then to occasional drink only (or quit).  -  Education provided to patient on alcohol abuse, alcohol withdrawal symptoms.  - If she finds herself having difficulty quitting or cutting back, she is to call in and will provide local resources for her and script for ativan taper.  - If any withdraw symptoms pt is to seek urgent care.  - Methylmalonic Acid - RBC Folate - CBC w/Diff - Pathologist smear review - Vitamin B1 - F/U 3 months.   > 25 minutes spent with patient, >50% of time spent face to face counseling patient and coordinating care.   electronically signed by:  Howard Pouch, DO  [place]

## 2016-03-11 ENCOUNTER — Telehealth: Payer: Self-pay | Admitting: Family Medicine

## 2016-03-11 LAB — PATHOLOGIST SMEAR REVIEW

## 2016-03-11 LAB — FOLATE RBC: RBC FOLATE: 663 ng/mL (ref >280)

## 2016-03-11 NOTE — Telephone Encounter (Signed)
Spoke with patient she is requesting something for sleep.

## 2016-03-11 NOTE — Telephone Encounter (Signed)
-   I am awaiting all her labs to return and then I can tell her if she needs to take the folate. - She can take B12 starting today. - Liver enzymes are elevated when there is liver damage, does not mean they have to be elevated if someone drinks alcohol. Elevated LFT does not have to occur, to see the changes we see in her blood currently. Also, as we discussed in her appt, alcohol is a possible cause, but not the only potential cause for her RBC to be larger. This is why we are doing labs and cutting back on ETOH to rule out causes.  - as far as sleep aide, we have never evaluated her for this condition nor is it on her list. She can try melatonin OTC (label directions). If she is in need of prescribed medicine we will need to evaluate her to see potential causes and thus choose medication that would meet her needs the best.

## 2016-03-11 NOTE — Telephone Encounter (Signed)
Patient is asking why her liver enzymes are not elevated if alcohol is cause of her abnormal lab results. She wants to know if she should start B12  And folate supplementation today. Patient also requesting to help her sleep. Please advise

## 2016-03-11 NOTE — Telephone Encounter (Signed)
Patient requesting call back from PCP to discuss follow up questions related to office visit yesterday, specifically questions concerning medication.

## 2016-03-12 NOTE — Telephone Encounter (Signed)
I Called patient this evening to discuss the need to have more information before prescribing medication for insomnia. Patient is having difficulty sleeping secondary to decreasing her alcohol consumption. She has never been treated for insomnia in the past. She states she did take melatonin in the past which caused nightmares and does not want to try an over-the-counter regimen.  I discussed with her in detail today that I would not want to start a prescribed medication for her to depend on, if she is able to work through her difficulty sleeping. This will take a time to transition, since she has been dependent on alcohol daily to relax her for sleep. She denies having any other alcohol withdrawal symptoms, therefore I do not feel she needs a benzodiazepine at this time. She has been made aware of alcohol withdrawal symptoms, and if she does experience any of the symptoms she is to call in and we will provide her with an Ativan prescription, short-term. Patient is in understanding to work on her sleep hygiene, and that this may take some to  Transition into a regular sleep pattern. Patient is agreeable with plan.

## 2016-03-12 NOTE — Telephone Encounter (Signed)
Left message for patient to call back to review information and instructions.

## 2016-03-12 NOTE — Telephone Encounter (Signed)
Spoke with patient reviewed information and instructions. Patient states she cannot take melatonin it causes her to have nightmares. She says since you told her not to drink wine and she feels this is what was helping her sleep she feels she has already been evaluated and states she is not paying another co-pay to be seen for something she feels is related to her last visit. Please advise.

## 2016-03-13 LAB — METHYLMALONIC ACID, SERUM: METHYLMALONIC ACID, QUANT: 78 nmol/L — AB (ref 87–318)

## 2016-03-15 LAB — VITAMIN B1: VITAMIN B1 (THIAMINE): 8 nmol/L (ref 8–30)

## 2016-03-17 ENCOUNTER — Telehealth: Payer: Self-pay | Admitting: Family Medicine

## 2016-03-17 NOTE — Telephone Encounter (Signed)
Please call pt: - Her other blood levels show lower end B1 complex as well. She would benefit from supplement of a B complex, instead of just b12.  - F/U as discussed prior

## 2016-03-17 NOTE — Telephone Encounter (Signed)
Left message on patient voice mail with results and instructions.

## 2016-04-02 ENCOUNTER — Ambulatory Visit (AMBULATORY_SURGERY_CENTER): Payer: Self-pay

## 2016-04-02 VITALS — Ht 64.0 in | Wt 147.0 lb

## 2016-04-02 DIAGNOSIS — Z860100 Personal history of colon polyps, unspecified: Secondary | ICD-10-CM

## 2016-04-02 NOTE — Progress Notes (Signed)
No allergies to eggs or soy No past problem with anesthesia No diet meds No home oxygen  Has email and internet; declined emmi

## 2016-04-14 ENCOUNTER — Encounter: Payer: Managed Care, Other (non HMO) | Admitting: Internal Medicine

## 2016-05-16 ENCOUNTER — Other Ambulatory Visit: Payer: Self-pay | Admitting: *Deleted

## 2016-05-16 DIAGNOSIS — J302 Other seasonal allergic rhinitis: Secondary | ICD-10-CM

## 2016-05-16 MED ORDER — FLUTICASONE PROPIONATE 50 MCG/ACT NA SUSP: 2.0000 | Freq: Every day | NASAL | 0 refills | Status: DC

## 2016-05-16 NOTE — Telephone Encounter (Signed)
[facility] OR  RF request for fluticasone LOV: 02/27/16 Next ov: None Last written: 06/28/15 #16g w/ 6RF

## 2016-06-17 ENCOUNTER — Other Ambulatory Visit: Payer: Self-pay | Admitting: Family Medicine

## 2016-06-17 DIAGNOSIS — J302 Other seasonal allergic rhinitis: Secondary | ICD-10-CM

## 2016-06-20 ENCOUNTER — Encounter: Payer: Self-pay | Admitting: Internal Medicine

## 2016-06-25 ENCOUNTER — Other Ambulatory Visit: Payer: Self-pay | Admitting: Family Medicine

## 2016-07-03 ENCOUNTER — Encounter: Payer: Self-pay | Admitting: Family Medicine

## 2016-07-03 ENCOUNTER — Ambulatory Visit (INDEPENDENT_AMBULATORY_CARE_PROVIDER_SITE_OTHER): Payer: Managed Care, Other (non HMO) | Admitting: Family Medicine

## 2016-07-03 VITALS — BP 132/88 | HR 71 | Temp 98.3°F | Resp 18 | Wt 149.5 lb

## 2016-07-03 DIAGNOSIS — R0989 Other specified symptoms and signs involving the circulatory and respiratory systems: Secondary | ICD-10-CM

## 2016-07-03 DIAGNOSIS — J302 Other seasonal allergic rhinitis: Secondary | ICD-10-CM

## 2016-07-03 DIAGNOSIS — F458 Other somatoform disorders: Secondary | ICD-10-CM | POA: Diagnosis not present

## 2016-07-03 NOTE — Patient Instructions (Signed)
Likely related to allergies.  Start mucinex to thin secretions. Continue allergy regimen.  Try your ativan (it is a muscle relaxer) and for anxiety.  If after all of this or you start to notice difficulty swallowing/choking follow back up.

## 2016-07-03 NOTE — Progress Notes (Signed)
Mikayla Tucker , 28-Feb-1961, 55 y.o., female MRN: [id number] Patient Care Team    Relationship Specialty Notifications Start End  Ma Hillock, DO PCP - General Family Medicine  08/07/15   Kennon Holter, NP Nurse Practitioner Nurse Practitioner  02/27/16    Comment: Gynecology    CC: globus sensation Subjective: Pt presents for an acute OV with complaints of globus sensation on the left side of her throat since this morning duration.  Associated symptoms include when pushing on her neck she feels a sensation in her throat,  She denies Mass of neck, tenderness, dysphagia, nausea, vomit, heartburn, foul taste, change in diet or medications. She also notices the sensation if she coughs. She has never experienced this prior. She doesnot have a h/o GERD. She does have seasonal allergies, and is compliant with allegra and flonase. She denies fever, chills, nausea, vomit, sore throat, runny nose or nasal congestion. She is having her colonoscopy tomorrow, but does not think she is too nervous. The only thing she can think she changed is her toothpaste. She denies rash.  Allergies  Allergen Reactions  . Hydrocodone-Acetaminophen Hives   Social History  Substance Use Topics  . Smoking status: Former Smoker    Packs/day: 1.00    Years: 10.00  . Smokeless tobacco: Never Used     Comment: quit 55 yrs old  . Alcohol use 4.2 oz/week    7 Glasses of wine per week   Past Medical History:  Diagnosis Date  . Allergy   . Cancer ([place])    SCC  . Hyperlipidemia    Past Surgical History:  Procedure Laterality Date  . APPENDECTOMY    . BACK SURGERY    . BREAST SURGERY     biopsy  . CHOLECYSTECTOMY    . COLONOSCOPY    . LIPOMA EXCISION     under breast on R side   Family History  Problem Relation Age of Onset  . Adopted: Yes  . Colon cancer Neg Hx      Medication List       Accurate as of 07/03/16 12:13 PM. Always use your most recent med list.          atorvastatin 80 MG  tablet Commonly known as:  LIPITOR TAKE 1 TABLET DAILY   b complex vitamins capsule Take 1 capsule by mouth daily.   Cholecalciferol 1000 units tablet Take 3,000 Units by mouth daily.   fexofenadine 180 MG tablet Commonly known as:  ALLEGRA Take 180 mg by mouth daily.   fluticasone 50 MCG/ACT nasal spray Commonly known as:  FLONASE PLACE 2 SPRAYS INTO BOTH NOSTRILS DAILY.   KRILL OIL OMEGA-3 PO Take by mouth.   LORazepam 1 MG tablet Commonly known as:  ATIVAN   meclizine 25 MG tablet Commonly known as:  ANTIVERT Take 1 tablet (25 mg total) by mouth 3 (three) times daily as needed for dizziness or nausea.       No results found for this or any previous visit (from the past 24 hour(s)). No results found.   ROS: Negative, with the exception of above mentioned in HPI   Objective:  BP 132/88 (BP Location: Right Arm, Patient Position: Sitting, Cuff Size: Normal)   Pulse 71   Temp 98.3 F (36.8 C)   Resp 18   Wt 149 lb 8 oz (67.8 kg)   SpO2 97%   BMI 25.66 kg/m  Body mass index is 25.66 kg/m. Gen: Afebrile. No acute distress.  Nontoxic in appearance, well developed, well nourished. Very pleasant.  HENT: AT. [place]. Bilateral TM visualized with air fluid levels. MMM, no oral lesions. Bilateral nares mild erythema, no swelling. Throat without erythema, swelling or exudates. No TTP sinus cavity. No hoarseness or cough on exam.  Eyes:Pupils Equal Round Reactive to light, Extraocular movements intact,  Conjunctiva without redness, discharge or icterus. Neck/lymp/endocrine: Supple, no lymphadenopathy CV: RRR Chest: CTAB, no wheeze or crackles. Good air movement, normal resp effort.  Skin: no rashes, purpura or petechiae.  Neuro: Normal gait. PERLA. EOMi. Alert. Oriented x3  Psych: Normal affect, dress and demeanor. Normal speech. Normal thought content and judgment.  Assessment/Plan: Mikayla Tucker is a 55 y.o. female present for acute OV for  Seasonal allergies/Globus  sensation - possibly secondary to allergy drainage vs anxiety. Normal exam today. No alarm signs.  - Continue allergy regimen and add plain mucinex.  - try taking an ativan today, if globus sensation caused from anxiety this may be helpful.  - F/U PRN, Alarm symptoms discussed.    electronically signed by:  Howard Pouch, DO  [place]

## 2016-07-04 ENCOUNTER — Encounter: Payer: Self-pay | Admitting: Internal Medicine

## 2016-07-04 ENCOUNTER — Ambulatory Visit (AMBULATORY_SURGERY_CENTER): Payer: Managed Care, Other (non HMO) | Admitting: Internal Medicine

## 2016-07-04 VITALS — BP 123/73 | HR 77 | Temp 98.0°F | Resp 18 | Ht 64.0 in | Wt 147.0 lb

## 2016-07-04 DIAGNOSIS — Z1211 Encounter for screening for malignant neoplasm of colon: Secondary | ICD-10-CM | POA: Diagnosis present

## 2016-07-04 DIAGNOSIS — D125 Benign neoplasm of sigmoid colon: Secondary | ICD-10-CM | POA: Diagnosis not present

## 2016-07-04 MED ORDER — SODIUM CHLORIDE 0.9 % IV SOLN: 500.0000 mL | INTRAVENOUS | Status: DC

## 2016-07-04 NOTE — Progress Notes (Signed)
A/ox3 pleased with MAC, report to Suzanne RN 

## 2016-07-04 NOTE — Progress Notes (Signed)
Called to room to assist during endoscopic procedure.  Patient ID and intended procedure confirmed with present staff. Received instructions for my participation in the procedure from the performing physician.  

## 2016-07-04 NOTE — Op Note (Addendum)
[facility] Patient Name: Mikayla Tucker Procedure Date: 07/04/2016 8:36 AM MRN: [id number] Endoscopist: Gatha Mayer , MD Age: 55 Referring MD:  Date of Birth: 06-Dec-1960 Gender: Female Account #: [id number] Procedure:                Colonoscopy Indications:              Screening for colorectal malignant neoplasm Medicines:                Propofol per Anesthesia, Monitored Anesthesia Care Procedure:                Pre-Anesthesia Assessment:                           - Prior to the procedure, a History and Physical                            was performed, and patient medications and                            allergies were reviewed. The patient's tolerance of                            previous anesthesia was also reviewed. The risks                            and benefits of the procedure and the sedation                            options and risks were discussed with the patient.                            All questions were answered, and informed consent                            was obtained. Prior Anticoagulants: The patient has                            taken no previous anticoagulant or antiplatelet                            agents. ASA Grade Assessment: II - A patient with                            mild systemic disease. After reviewing the risks                            and benefits, the patient was deemed in                            satisfactory condition to undergo the procedure.                           After obtaining informed consent, the colonoscope  was passed under direct vision. Throughout the                            procedure, the patient's blood pressure, pulse, and                            oxygen saturations were monitored continuously. The                            [id number] was introduced through the anus                            and advanced to the the cecum, identified by      appendiceal orifice and ileocecal valve. The                            quality of the bowel preparation was excellent. The                            colonoscopy was performed without difficulty. The                            patient tolerated the procedure well. The bowel                            preparation used was Miralax. The ileocecal valve,                            appendiceal orifice, and rectum were photographed. Scope In: 8:47:46 AM Scope Out: 8:59:52 AM Scope Withdrawal Time: 0 hours 9 minutes 5 seconds  Total Procedure Duration: 0 hours 12 minutes 6 seconds  Findings:                 The perianal and digital rectal examinations were                            normal.                           A 8 mm polyp was found in the sigmoid colon. The                            polyp was pedunculated. The polyp was removed with                            a hot snare. Resection and retrieval were complete.                            Verification of patient identification for the                            specimen was done. Estimated blood loss was minimal.  The exam was otherwise without abnormality on                            direct and retroflexion views. Complications:            No immediate complications. Estimated blood loss:                            None. Estimated Blood Loss:     Estimated blood loss: none. Impression:               - One 8 mm polyp in the sigmoid colon, removed with                            a hot snare. Resected and retrieved.                           - The examination was otherwise normal on direct                            and retroflexion views. Recommendation:           - Patient has a contact number available for                            emergencies. The signs and symptoms of potential                            delayed complications were discussed with the                            patient. Return to normal  activities tomorrow.                            Written discharge instructions were provided to the                            patient.                           - Resume previous diet.                           - Continue present medications.                           - Patient has a contact number available for                            emergencies. The signs and symptoms of potential                            delayed complications were discussed with the                            patient. Return to normal activities tomorrow.  Written discharge instructions were provided to the                            patient.                           - Repeat colonoscopy is recommended. The                            colonoscopy date will be determined after pathology                            results from today's exam become available for                            review.                           - No aspirin, ibuprofen, naproxen, or other                            non-steroidal anti-inflammatory drugs for 2 weeks                            after polyp removal. Gatha Mayer, MD 07/04/2016 9:08:49 AM This report has been signed electronically.

## 2016-07-04 NOTE — Patient Instructions (Addendum)
I found and removed one polyp that looks benign.  I will let you know pathology results and when to have another routine colonoscopy by mail.  I appreciate the opportunity to care for you. Gatha Mayer, MD, FACG   YOU HAD AN ENDOSCOPIC PROCEDURE TODAY AT [facility]:   Refer to the procedure report that was given to you for any specific questions about what was found during the examination.  If the procedure report does not answer your questions, please call your gastroenterologist to clarify.  If you requested that your care partner not be given the details of your procedure findings, then the procedure report has been included in a sealed envelope for you to review at your convenience later.  YOU SHOULD EXPECT: Some feelings of bloating in the abdomen. Passage of more gas than usual.  Walking can help get rid of the air that was put into your GI tract during the procedure and reduce the bloating. If you had a lower endoscopy (such as a colonoscopy or flexible sigmoidoscopy) you may notice spotting of blood in your stool or on the toilet paper. If you underwent a bowel prep for your procedure, you may not have a normal bowel movement for a few days.  Please Note:  You might notice some irritation and congestion in your nose or some drainage.  This is from the oxygen used during your procedure.  There is no need for concern and it should clear up in a day or so.  SYMPTOMS TO REPORT IMMEDIATELY:   Following lower endoscopy (colonoscopy or flexible sigmoidoscopy):  Excessive amounts of blood in the stool  Significant tenderness or worsening of abdominal pains  Swelling of the abdomen that is new, acute  Fever of 100F or higher   For urgent or emergent issues, a gastroenterologist can be reached at any hour by calling [phone].   DIET:  We do recommend a small meal at first, but then you may proceed to your regular diet.  Drink plenty of fluids but you  should avoid alcoholic beverages for 24 hours.  ACTIVITY:  You should plan to take it easy for the rest of today and you should NOT DRIVE or use heavy machinery until tomorrow (because of the sedation medicines used during the test).    FOLLOW UP: Our staff will call the number listed on your records the next business day following your procedure to check on you and address any questions or concerns that you may have regarding the information given to you following your procedure. If we do not reach you, we will leave a message.  However, if you are feeling well and you are not experiencing any problems, there is no need to return our call.  We will assume that you have returned to your regular daily activities without incident.  If any biopsies were taken you will be contacted by phone or by letter within the next 1-3 weeks.  Please call us at [phone] if you have not heard about the biopsies in 3 weeks.    SIGNATURES/CONFIDENTIALITY: You and/or your care partner have signed paperwork which will be entered into your electronic medical record.  These signatures attest to the fact that that the information above on your After Visit Summary has been reviewed and is understood.  Full responsibility of the confidentiality of this discharge information lies with you and/or your care-partner.  Read all of the handouts given to you by your recovery room  nurse.  Hoyt Koch for choosing [place] for your healthcare needs today.  NO ASPIRIN, ALEVE OR IBUPROFEN FOR TWO WEEKS PER DR Najla Aughenbaugh.

## 2016-07-07 ENCOUNTER — Telehealth: Payer: Self-pay

## 2016-07-07 NOTE — Telephone Encounter (Signed)
  Follow up Call-  Call back number 07/04/2016  Post procedure Call Back phone  # [phone]  Permission to leave phone message Yes  Some recent data might be hidden     Patient questions:  Do you have a fever, pain , or abdominal swelling? No. Pain Score  0 *  Have you tolerated food without any problems? Yes.    Have you been able to return to your normal activities? Yes.    Do you have any questions about your discharge instructions: Diet   No. Medications  No. Follow up visit  No.  Do you have questions or concerns about your Care? No.  Actions: * If pain score is 4 or above: No action needed, pain <4.  No problems noted per the pt. maw

## 2016-07-15 ENCOUNTER — Other Ambulatory Visit: Payer: Self-pay | Admitting: *Deleted

## 2016-07-15 DIAGNOSIS — J301 Allergic rhinitis due to pollen: Secondary | ICD-10-CM

## 2016-07-15 MED ORDER — FLUTICASONE PROPIONATE 50 MCG/ACT NA SUSP: 2.0000 | Freq: Every day | NASAL | 3 refills | Status: DC

## 2016-07-16 ENCOUNTER — Encounter: Payer: Self-pay | Admitting: Internal Medicine

## 2016-07-16 DIAGNOSIS — Z86010 Personal history of colonic polyps: Secondary | ICD-10-CM

## 2016-07-16 NOTE — Progress Notes (Signed)
8 mm adenoma Recall colon 07/2021

## 2016-11-12 ENCOUNTER — Ambulatory Visit (INDEPENDENT_AMBULATORY_CARE_PROVIDER_SITE_OTHER): Payer: Commercial Managed Care - PPO

## 2016-11-12 ENCOUNTER — Telehealth: Payer: Self-pay | Admitting: Family Medicine

## 2016-11-12 DIAGNOSIS — E785 Hyperlipidemia, unspecified: Secondary | ICD-10-CM

## 2016-11-12 DIAGNOSIS — Z23 Encounter for immunization: Secondary | ICD-10-CM | POA: Diagnosis not present

## 2016-11-12 DIAGNOSIS — E559 Vitamin D deficiency, unspecified: Secondary | ICD-10-CM

## 2016-11-12 DIAGNOSIS — Z0000 Encounter for general adult medical examination without abnormal findings: Secondary | ICD-10-CM

## 2016-11-12 DIAGNOSIS — Z1211 Encounter for screening for malignant neoplasm of colon: Secondary | ICD-10-CM

## 2016-11-12 DIAGNOSIS — R7303 Prediabetes: Secondary | ICD-10-CM

## 2016-11-12 DIAGNOSIS — Z6825 Body mass index (BMI) 25.0-25.9, adult: Secondary | ICD-10-CM

## 2016-11-12 MED ORDER — LORAZEPAM 1 MG PO TABS
1.0000 mg | ORAL_TABLET | Freq: Two times a day (BID) | ORAL | 0 refills | Status: DC | PRN
Start: 2016-11-12 — End: 2017-04-29

## 2016-11-12 NOTE — Telephone Encounter (Signed)
Please call pt: - I have called in refills for the ativan. She will need an appt prior to future refills to discuss use, since it is a controlled substance.

## 2016-11-12 NOTE — Telephone Encounter (Signed)
Patient notified and verbalized understanding. She will call back to schedule an appointment.

## 2016-11-12 NOTE — Telephone Encounter (Signed)
Patient requesting refill of LORazepam (ATIVAN) 1 MG tablet.  [id number] [place], [place] [phone] (Phone) [phone] (Fax)

## 2017-04-29 ENCOUNTER — Encounter: Payer: Self-pay | Admitting: Family Medicine

## 2017-04-29 ENCOUNTER — Ambulatory Visit (INDEPENDENT_AMBULATORY_CARE_PROVIDER_SITE_OTHER): Payer: Commercial Managed Care - PPO | Admitting: Family Medicine

## 2017-04-29 VITALS — BP 138/83 | HR 56 | Temp 97.7°F | Resp 20 | Ht 64.0 in | Wt 149.0 lb

## 2017-04-29 DIAGNOSIS — R7303 Prediabetes: Secondary | ICD-10-CM

## 2017-04-29 DIAGNOSIS — F411 Generalized anxiety disorder: Secondary | ICD-10-CM | POA: Diagnosis not present

## 2017-04-29 DIAGNOSIS — E785 Hyperlipidemia, unspecified: Secondary | ICD-10-CM | POA: Diagnosis not present

## 2017-04-29 DIAGNOSIS — Z0000 Encounter for general adult medical examination without abnormal findings: Secondary | ICD-10-CM | POA: Diagnosis not present

## 2017-04-29 DIAGNOSIS — Z1211 Encounter for screening for malignant neoplasm of colon: Secondary | ICD-10-CM | POA: Diagnosis not present

## 2017-04-29 DIAGNOSIS — Z6825 Body mass index (BMI) 25.0-25.9, adult: Secondary | ICD-10-CM

## 2017-04-29 DIAGNOSIS — E559 Vitamin D deficiency, unspecified: Secondary | ICD-10-CM

## 2017-04-29 DIAGNOSIS — Z79899 Other long term (current) drug therapy: Secondary | ICD-10-CM | POA: Diagnosis not present

## 2017-04-29 DIAGNOSIS — Z130 Encounter for screening for diseases of the blood and blood-forming organs and certain disorders involving the immune mechanism: Secondary | ICD-10-CM

## 2017-04-29 LAB — LIPID PANEL
CHOLESTEROL: 258 mg/dL — AB (ref <200)
HDL: 69 mg/dL (ref >50)
LDL Cholesterol: 166 mg/dL — ABNORMAL HIGH (ref <100)
Total CHOL/HDL Ratio: 3.7 Ratio (ref <5.0)
Triglycerides: 115 mg/dL (ref <150)
VLDL: 23 mg/dL (ref <30)

## 2017-04-29 LAB — COMPREHENSIVE METABOLIC PANEL
ALBUMIN: 4.5 g/dL (ref 3.6–5.1)
ALK PHOS: 95 U/L (ref 33–130)
ALT: 120 U/L — AB (ref 6–29)
AST: 70 U/L — AB (ref 10–35)
BILIRUBIN TOTAL: 0.7 mg/dL (ref 0.2–1.2)
BUN: 12 mg/dL (ref 7–25)
CO2: 23 mmol/L (ref 20–31)
CREATININE: 0.6 mg/dL (ref 0.50–1.05)
Calcium: 9.8 mg/dL (ref 8.6–10.4)
Chloride: 101 mmol/L (ref 98–110)
Glucose, Bld: 90 mg/dL (ref 65–99)
Potassium: 4.8 mmol/L (ref 3.5–5.3)
SODIUM: 133 mmol/L — AB (ref 135–146)
TOTAL PROTEIN: 7.4 g/dL (ref 6.1–8.1)

## 2017-04-29 LAB — CBC WITH DIFFERENTIAL/PLATELET
BASOS ABS: 63 {cells}/uL (ref 0–200)
Basophils Relative: 1 %
EOS ABS: 189 {cells}/uL (ref 15–500)
EOS PCT: 3 %
HCT: 40.8 % (ref 35.0–45.0)
HEMOGLOBIN: 13.8 g/dL (ref 11.7–15.5)
LYMPHS ABS: 2142 {cells}/uL (ref 850–3900)
Lymphocytes Relative: 34 %
MCH: 35 pg — AB (ref 27.0–33.0)
MCHC: 33.8 g/dL (ref 32.0–36.0)
MCV: 103.6 fL — ABNORMAL HIGH (ref 80.0–100.0)
MPV: 10 fL (ref 7.5–12.5)
Monocytes Absolute: 567 cells/uL (ref 200–950)
Monocytes Relative: 9 %
NEUTROS ABS: 3339 {cells}/uL (ref 1500–7800)
Neutrophils Relative %: 53 %
Platelets: 317 10*3/uL (ref 140–400)
RBC: 3.94 MIL/uL (ref 3.80–5.10)
RDW: 13.2 % (ref 11.0–15.0)
WBC: 6.3 10*3/uL (ref 3.8–10.8)

## 2017-04-29 MED ORDER — ZOSTER VAC RECOMB ADJUVANTED 50 MCG/0.5ML IM SUSR: INTRAMUSCULAR | 1 refills | Status: DC

## 2017-04-29 MED ORDER — ATORVASTATIN CALCIUM 80 MG PO TABS: 80.0000 mg | ORAL_TABLET | Freq: Every day | ORAL | 3 refills | Status: DC

## 2017-04-29 MED ORDER — LORAZEPAM 1 MG PO TABS: 1.0000 mg | ORAL_TABLET | Freq: Two times a day (BID) | ORAL | 5 refills | Status: DC | PRN

## 2017-04-29 NOTE — Patient Instructions (Signed)

## 2017-04-29 NOTE — Progress Notes (Signed)
Patient ID: Mikayla Tucker, female   DOB: October 11, 1960, 56 y.o.   MRN: [id number]       Patient ID: Mikayla Tucker, female  DOB: 1960-10-12, 56 y.o.   MRN: [id number]  Patient Care Team    Relationship Specialty Notifications Start End  Ma Hillock, DO PCP - General Family Medicine  08/07/15   Kennon Holter, NP Nurse Practitioner Nurse Practitioner  02/27/16    Comment: Gynecology  Gatha Mayer, MD Consulting Physician Gastroenterology  04/30/17     Subjective:  Mikayla Tucker is a 56 y.o. female present for preventive/annual visit.  All past medical history, surgical history, allergies, family history, immunizations, medications and social history were updated in the electronic medical record today. All recent labs, ED visits and hospitalizations within the last year were reviewed.  Anxiety:  Health maintenance: Reviewed and updated 04/30/2017 Colonoscopy: Completed 07/04/2016, 5 year recall. One colon polyp. Family history unknown (adopted). Completed by Dr. Carlean Purl. Mammogram: Completed 02/13/2016, patient reports normal. Completed at [facility]. Report requested Cervical cancer screening: Completed [facility]/13/2016, [facility]. Patient reports normal. Report requested. Immunizations: tdap UTD completed 02/27/2016, influenza UTD 11/12/2016 (encouraged yearly), Shingrix prescription provided Infectious disease screening: HIV and hepatitis C completed Assistive device: None Oxygen GUR:[facility] Patient has a Dental home. Hospitalizations/ED visits: none  Past Medical History:  Diagnosis Date  . Allergy   . Hyperlipidemia   . Squamous cell skin cancer    SCC   Allergies  Allergen Reactions  . Hydrocodone-Acetaminophen Hives   Past Surgical History:  Procedure Laterality Date  . APPENDECTOMY    . BACK SURGERY    . BREAST SURGERY     biopsy  . CHOLECYSTECTOMY    . COLONOSCOPY    . LIPOMA EXCISION     under breast on R side   Family  History  Problem Relation Age of Onset  . Adopted: Yes  . Colon cancer Neg Hx    Social History   Social History  . Marital status: Married    Spouse name: N/A  . Number of children: 1  . Years of education: N/A   Occupational History  .  [employer]    homemaker   Social History Main Topics  . Smoking status: Former Smoker    Packs/day: 1.00    Years: 10.00  . Smokeless tobacco: Never Used     Comment: quit 56 yrs old  . Alcohol use 4.2 oz/week    7 Glasses of wine per week  . Drug use: No  . Sexual activity: Yes    Birth control/ protection: Post-menopausal   Other Topics Concern  . Not on file   Social History Narrative   Ms. Stuckey lives with her husband and daughter. She is originally from [place]-moved to [place]. She relocated to [facility] about [place] from [place].   Allergies as of 04/29/2017      Reactions   Hydrocodone-acetaminophen Hives      Medication List       Accurate as of 04/29/17 11:59 PM. Always use your most recent med list.          atorvastatin 80 MG tablet Commonly known as:  LIPITOR Take 1 tablet (80 mg total) by mouth daily.   b complex vitamins capsule Take 1 capsule by mouth daily.   Cholecalciferol 1000 units tablet Take 3,000 Units by mouth daily.   fexofenadine 180 MG tablet Commonly known as:  ALLEGRA Take 180  mg by mouth daily.   fluticasone 50 MCG/ACT nasal spray Commonly known as:  FLONASE Place 2 sprays into both nostrils daily.   KRILL OIL OMEGA-3 PO Take by mouth.   LORazepam 1 MG tablet Commonly known as:  ATIVAN Take 1 tablet (1 mg total) by mouth 2 (two) times daily as needed for anxiety.   Zoster Vac Recomb Adjuvanted injection Commonly known as:  SHINGRIX IM 50 MCG once, then repeat dose in 2-6 months.        ROS: Negative, with the exception of above mentioned in HPI  Objective: BP 138/83 (BP Location: Right Arm, Patient Position: Sitting, Cuff Size: Normal)   Pulse (!) 56   Temp 97.7 F (36.5  C)   Resp 20   Ht 5\' 4"  (1.626 m)   Wt 149 lb (67.6 kg)   SpO2 98%   BMI 25.58 kg/m  Gen: Afebrile. No acute distress.  HENT: AT. . Bilateral TM visualized and normal in appearance. MMM. Bilateral nares without erythema or swelling. Throat without erythema or exudates. No cough, no shortness of breath. Eyes:Pupils Equal Round Reactive to light, Extraocular movements intact,  Conjunctiva without redness, discharge or icterus. Neck/lymp/endocrine: Supple, no lymphadenopathy, no thyromegaly CV: RRR no murmur, no edema, +2/4 P posterior tibialis pulses Chest: CTAB, no wheeze or crackles Abd: Soft. Flat. NTND. BS present. No Masses palpated.  MSK: No obvious deformities, full range of motion. Neurovascularly intact distally Skin: No rashes, purpura or petechiae.  Neuro:  Normal gait. PERLA. EOMi. Alert. Oriented. Cranial nerves II through XII intact. Muscle strength 5/5 bilateral upper and lower  extremity. DTRs equal bilaterally. Psych: Normal affect, dress and demeanor. Normal speech. Normal thought content and judgment.   Assessment/plan: Mikayla Tucker is a 56 y.o. female present for annual exam.  Encounter for preventive health examination Patient was encouraged to exercise greater than 150 minutes a week. Patient was encouraged to choose a diet filled with fresh fruits and vegetables and lean meats. AVS was provided to patient today on education recommendation on gender specific health and safety maintenance. - All desired screenings and immunizations are up-to-date. - Shingrix prescription provided  Dyslipidemia - Lipid panel, TSH, CBC, CMP - Continue atorvastatin 80 mg daily  Vitamin D deficiency - Vitamin D level obtained today - Continue vitamin D result 3000/d  Prediabetes - A1c 5.8--> 5.7--> recheck today. - Patient again made aware of dietary changes and exercise benefits  Anxiety: - Educated patient future appointments for controlled substance/anxiety will need to be  completed with an appointment dedicated to that issue alone. Patient was educated on laws and protocols surrounding controlled substances. - Patient signed a controlled substance agreement today. - [place] controlled substance database was reviewed, presented in mid part of her permanent chart. Database was appropriate. - Ativan 1 mg twice a day when necessary, #60, 5 refills provided. - Patient will need to follow-up every 6 months in person, with this provider only, in order to continue this medication. Patient understands and agrees with plan.  Return in about 1 year (around 04/29/2018) for CPE.  Electronically signed by: Howard Pouch, DO [place]

## 2017-04-30 DIAGNOSIS — Z79899 Other long term (current) drug therapy: Secondary | ICD-10-CM | POA: Insufficient documentation

## 2017-04-30 LAB — VITAMIN D 25 HYDROXY (VIT D DEFICIENCY, FRACTURES): VIT D 25 HYDROXY: 55 ng/mL (ref 30–100)

## 2017-04-30 LAB — HEMOGLOBIN A1C
Hgb A1c MFr Bld: 5.4 % (ref <5.7)
MEAN PLASMA GLUCOSE: 108 mg/dL

## 2017-04-30 LAB — TSH: TSH: 0.97 m[IU]/L

## 2017-05-01 ENCOUNTER — Telehealth: Payer: Self-pay | Admitting: Family Medicine

## 2017-05-01 NOTE — Telephone Encounter (Signed)
Please call pt - her labs all look good, except her cholesterol mildly elevated (not much) since last check. Continue statin and try to increase exercise to > 150/min week and lower dietary sugars/saturated fats. Increase fiber.  - Her liver enzymes are elevated and her red blood cells are larger than normal. This has always been borderline for her in the past, but it is worsening now. Both of these will need additional follow up to figure out the cause and discuss. Please have her make an appt in 2-4 weeks to discuss and repeat labs at that time. In the meantime, avoid alcohol and tylenol if using.

## 2017-05-01 NOTE — Telephone Encounter (Signed)
Spoke with patient reviewed lab results and instructions patient verbalized understanding of all instructions. Patient will call back to schedule an appt .

## 2017-07-27 ENCOUNTER — Encounter: Payer: Self-pay | Admitting: Family Medicine

## 2017-07-27 ENCOUNTER — Ambulatory Visit (INDEPENDENT_AMBULATORY_CARE_PROVIDER_SITE_OTHER): Payer: Commercial Managed Care - PPO | Admitting: Family Medicine

## 2017-07-27 VITALS — BP 125/86 | HR 64 | Temp 97.8°F | Resp 16 | Wt 155.0 lb

## 2017-07-27 DIAGNOSIS — R7989 Other specified abnormal findings of blood chemistry: Secondary | ICD-10-CM

## 2017-07-27 DIAGNOSIS — R945 Abnormal results of liver function studies: Secondary | ICD-10-CM

## 2017-07-27 DIAGNOSIS — D7589 Other specified diseases of blood and blood-forming organs: Secondary | ICD-10-CM | POA: Diagnosis not present

## 2017-07-27 DIAGNOSIS — R42 Dizziness and giddiness: Secondary | ICD-10-CM | POA: Diagnosis not present

## 2017-07-27 DIAGNOSIS — R51 Headache: Secondary | ICD-10-CM | POA: Diagnosis not present

## 2017-07-27 DIAGNOSIS — R519 Headache, unspecified: Secondary | ICD-10-CM

## 2017-07-27 DIAGNOSIS — H8111 Benign paroxysmal vertigo, right ear: Secondary | ICD-10-CM

## 2017-07-27 LAB — CBC WITH DIFFERENTIAL/PLATELET
BASOS PCT: 0.6 % (ref 0.0–3.0)
Basophils Absolute: 0 10*3/uL (ref 0.0–0.1)
EOS PCT: 2 % (ref 0.0–5.0)
Eosinophils Absolute: 0.2 10*3/uL (ref 0.0–0.7)
HEMATOCRIT: 40.7 % (ref 36.0–46.0)
Hemoglobin: 13.5 g/dL (ref 12.0–15.0)
LYMPHS ABS: 2.5 10*3/uL (ref 0.7–4.0)
LYMPHS PCT: 30.7 % (ref 12.0–46.0)
MCHC: 33.3 g/dL (ref 30.0–36.0)
MCV: 106.2 fl — AB (ref 78.0–100.0)
MONOS PCT: 6.8 % (ref 3.0–12.0)
Monocytes Absolute: 0.5 10*3/uL (ref 0.1–1.0)
NEUTROS ABS: 4.8 10*3/uL (ref 1.4–7.7)
NEUTROS PCT: 59.9 % (ref 43.0–77.0)
PLATELETS: 301 10*3/uL (ref 150.0–400.0)
RBC: 3.83 Mil/uL — ABNORMAL LOW (ref 3.87–5.11)
RDW: 12.4 % (ref 11.5–15.5)
WBC: 8 10*3/uL (ref 4.0–10.5)

## 2017-07-27 LAB — COMPREHENSIVE METABOLIC PANEL
ALT: 40 U/L — AB (ref 0–35)
AST: 28 U/L (ref 0–37)
Albumin: 4.4 g/dL (ref 3.5–5.2)
Alkaline Phosphatase: 85 U/L (ref 39–117)
BILIRUBIN TOTAL: 0.7 mg/dL (ref 0.2–1.2)
BUN: 15 mg/dL (ref 6–23)
CALCIUM: 10.1 mg/dL (ref 8.4–10.5)
CO2: 29 meq/L (ref 19–32)
Chloride: 98 mEq/L (ref 96–112)
Creatinine, Ser: 0.57 mg/dL (ref 0.40–1.20)
GFR: 116.8 mL/min (ref >60.00)
GLUCOSE: 91 mg/dL (ref 70–99)
POTASSIUM: 4.4 meq/L (ref 3.5–5.1)
Sodium: 135 mEq/L (ref 135–145)
Total Protein: 7.3 g/dL (ref 6.0–8.3)

## 2017-07-27 LAB — TSH: TSH: 1.12 u[IU]/mL (ref 0.35–4.50)

## 2017-07-27 LAB — URINALYSIS, ROUTINE W REFLEX MICROSCOPIC
BILIRUBIN URINE: NEGATIVE
Hgb urine dipstick: NEGATIVE
Ketones, ur: NEGATIVE
LEUKOCYTES UA: NEGATIVE
Nitrite: NEGATIVE
PH: 6.5 (ref 5.0–8.0)
RBC / HPF: NONE SEEN (ref 0–?)
Specific Gravity, Urine: <=1.005 — AB (ref 1.000–1.030)
TOTAL PROTEIN, URINE-UPE24: NEGATIVE
URINE GLUCOSE: NEGATIVE
Urobilinogen, UA: 0.2 (ref 0.0–1.0)

## 2017-07-27 LAB — VITAMIN B12: VITAMIN B 12: 541 pg/mL (ref 211–911)

## 2017-07-27 LAB — FOLATE

## 2017-07-27 LAB — MAGNESIUM: Magnesium: 1.9 mg/dL (ref 1.5–2.5)

## 2017-07-27 NOTE — Progress Notes (Signed)
Mikayla Tucker , Mikayla Tucker, 56 y.o., female MRN: [id number] Patient Care Team    Relationship Specialty Notifications Start End  Ma Hillock, DO PCP - General Family Medicine  08/07/15   Kennon Holter, NP Nurse Practitioner Nurse Practitioner  02/27/16    Comment: Gynecology  Gatha Mayer, MD Consulting Physician Gastroenterology  04/30/17     Chief Complaint  Patient presents with  . Dizziness    headaches     Subjective: Pt presents for an OV with complaints of Dizziness and headaches of or weeks duration.  Associated symptoms include feeling like a "brain fog. " Patient has had vertigo in the past. She reports this is very similar to that sensation. She definitely feels that the right sided head movements worsen her symptoms. She reports the room spinning or moving has decreased, but still feels there is a "lag ". She noticed the symptoms seemed to increase after a high sodium meal. Symptoms only last a few seconds and self resolved. She never needed to take meclizine, although prescribed in the past.  Elevated liver enzymes/macrocytosis: Patient has been seen in the past for mild macrocytosis that improved but never completely resolved. On her physical this year in July she was noted to have rather significant elevation in her liver enzymes AST 70/ALT 120. Her MCV was increasing again at that time to 103.6 from 100.7. Discussion in the past on alcohol consumption and patient denies more than 1 glass of wine a night. She reports that over the summer she may been having 2 glasses of wine a night. Prior workup for macrocytosis 02/27/2016 with folate 20.5, B-12 333, TSH 0.84.   Depression screen [facility] 2/9 04/29/2017  Decreased Interest 0  Down, Depressed, Hopeless 0  PHQ - 2 Score 0    Allergies  Allergen Reactions  . Hydrocodone-Acetaminophen Hives   Social History  Substance Use Topics  . Smoking status: Former Smoker    Packs/day: 1.00    Years: 10.00  . Smokeless  tobacco: Never Used     Comment: quit 56 yrs old  . Alcohol use 4.2 oz/week    7 Glasses of wine per week   Past Medical History:  Diagnosis Date  . Allergy   . Hyperlipidemia   . Squamous cell skin cancer    SCC   Past Surgical History:  Procedure Laterality Date  . APPENDECTOMY    . BACK SURGERY    . BREAST SURGERY     biopsy  . CHOLECYSTECTOMY    . COLONOSCOPY    . LIPOMA EXCISION     under breast on R side   Family History  Problem Relation Age of Onset  . Adopted: Yes  . Colon cancer Neg Hx    Allergies as of 07/27/2017      Reactions   Hydrocodone-acetaminophen Hives      Medication List       Accurate as of 07/27/17  1:14 PM. Always use your most recent med list.          atorvastatin 80 MG tablet Commonly known as:  LIPITOR Take 1 tablet (80 mg total) by mouth daily.   b complex vitamins capsule Take 1 capsule by mouth daily.   Cholecalciferol 1000 units tablet Take 3,000 Units by mouth daily.   fexofenadine 180 MG tablet Commonly known as:  ALLEGRA Take 180 mg by mouth daily.   fluticasone 50 MCG/ACT nasal spray Commonly known as:  FLONASE Place 2 sprays into both  nostrils daily.   KRILL OIL OMEGA-3 PO Take by mouth.   LORazepam 1 MG tablet Commonly known as:  ATIVAN Take 1 tablet (1 mg total) by mouth 2 (two) times daily as needed for anxiety.   Zoster Vaccine Adjuvanted injection Commonly known as:  SHINGRIX IM 50 MCG once, then repeat dose in 2-6 months.       All past medical history, surgical history, allergies, family history, immunizations andmedications were updated in the EMR today and reviewed under the history and medication portions of their EMR.     ROS: Negative, with the exception of above mentioned in HPI   Objective:  BP 125/86 (BP Location: Left Arm, Patient Position: Sitting, Cuff Size: Large)   Pulse 64   Temp 97.8 F (36.6 C) (Oral)   Resp 16   Wt 155 lb (70.3 kg)   SpO2 96%   BMI 26.61 kg/m  Body  mass index is 26.61 kg/m. Gen: Afebrile. No acute distress. Nontoxic in appearance, well developed, well nourished. Very pleasant Caucasian female. HENT: AT. [place]. Bilateral TM visualized with mild fullness bilateral ears, no erythema. MMM, no oral lesions. Bilateral nares within normal limits. Throat without erythema or exudates. No cough or hoarseness. Eyes:Pupils Equal Round Reactive to light, Extraocular movements intact,  Conjunctiva without redness, discharge or icterus. Neck/lymp/endocrine: Supple, no lymphadenopathy CV: RRR no murmur, no edema Chest: CTAB, no wheeze or crackles. Good air movement, normal resp effort.  Abd: Soft. NTND. BS present.  Neuro:  Normal gait. PERLA. EOMi. Alert. Oriented x3 Cranial nerves II through XII intact. Muscle strength 5/5 lateral upper and lower extremity. Mild reproduction of symptoms with quick head movements left and right. DTRs equal bilaterally. Psych: Normal affect, dress and demeanor. Normal speech. Normal thought content and judgment.  No exam data present No results found. No results found for this or any previous visit (from the past 24 hour(s)).  Assessment/Plan: Mikayla Tucker is a 56 y.o. female present for OV for  Macrocytosis without anemia/elevated LFTs - Macrocytosis without anemia is worsening with levels greater than 106 on repeat. Will refer to hematology for recommendations. - Advised patient to refrain from any alcohol. - Ultrasound of liver ordered. - TSH - CBC w/Diff - Comp Met (CMET) - HIV antibody (with reflex) - Hepatitis, Acute - Magnesium - B12 - Folate - Urinalysis, Routine w reflex microscopic  Benign paroxysmal positional vertigo of left ear/dizziness/headache - Uncertain if this is BPPV or multifactorial given macrocytosis and elevated LFTs. - Patient has continued the B-12 complex. Encouraged her to start a magnesium daily supplement. - If no improvement and or concerns increased for potential malignancy, the  threshold to image brain. Could also Consider nortriptyline. - Continue daily Allegra. Continue daily Flonase. If sodium is okay, could consider low-dose HCTZ, symptoms only last a few seconds would be atypical for Mnire's,  patient does admit to feeling "ear fullness ". - Consider vestibular rehabilitation versus neurology referral, depending upon lab and imaging results. - TSH - CBC w/Diff - Comp Met (CMET) - HIV antibody (with reflex) - Hepatitis, Acute - Magnesium - B12 - Folate - Urinalysis, Routine w reflex microscopic Follow-up 2 weeks   Reviewed expectations re: course of current medical issues.  Discussed self-management of symptoms.  Outlined signs and symptoms indicating need for more acute intervention.  Patient verbalized understanding and all questions were answered.  Patient received an After-Visit Summary.    No orders of the defined types were placed in this encounter.  Note is dictated utilizing voice recognition software. Although note has been proof read prior to signing, occasional typographical errors still can be missed. If any questions arise, please do not hesitate to call for verification.   electronically signed by:  Howard Pouch, DO  [place]

## 2017-07-27 NOTE — Patient Instructions (Addendum)
Hydrate.  After labs we will discuss further plan.    Benign Positional Vertigo Vertigo is the feeling that you or your surroundings are moving when they are not. Benign positional vertigo is the most common form of vertigo. The cause of this condition is not serious (is benign). This condition is triggered by certain movements and positions (is positional). This condition can be dangerous if it occurs while you are doing something that could endanger you or others, such as driving. What are the causes? In many cases, the cause of this condition is not known. It may be caused by a disturbance in an area of the inner ear that helps your brain to sense movement and balance. This disturbance can be caused by a viral infection (labyrinthitis), head injury, or repetitive motion. What increases the risk? This condition is more likely to develop in:  Women.  People who are 63 years of age or older.  What are the signs or symptoms? Symptoms of this condition usually happen when you move your head or your eyes in different directions. Symptoms may start suddenly, and they usually last for less than a minute. Symptoms may include:  Loss of balance and falling.  Feeling like you are spinning or moving.  Feeling like your surroundings are spinning or moving.  Nausea and vomiting.  Blurred vision.  Dizziness.  Involuntary eye movement (nystagmus).  Symptoms can be mild and cause only slight annoyance, or they can be severe and interfere with daily life. Episodes of benign positional vertigo may return (recur) over time, and they may be triggered by certain movements. Symptoms may improve over time. How is this diagnosed? This condition is usually diagnosed by medical history and a physical exam of the head, neck, and ears. You may be referred to a health care provider who specializes in ear, nose, and throat (ENT) problems (otolaryngologist) or a provider who specializes in disorders of the  nervous system (neurologist). You may have additional testing, including:  MRI.  A CT scan.  Eye movement tests. Your health care provider may ask you to change positions quickly while he or she watches you for symptoms of benign positional vertigo, such as nystagmus. Eye movement may be tested with an electronystagmogram (ENG), caloric stimulation, the Dix-Hallpike test, or the roll test.  An electroencephalogram (EEG). This records electrical activity in your brain.  Hearing tests.  How is this treated? Usually, your health care provider will treat this by moving your head in specific positions to adjust your inner ear back to normal. Surgery may be needed in severe cases, but this is rare. In some cases, benign positional vertigo may resolve on its own in 2-4 weeks. Follow these instructions at home: Safety  Move slowly.Avoid sudden body or head movements.  Avoid driving.  Avoid operating heavy machinery.  Avoid doing any tasks that would be dangerous to you or others if a vertigo episode would occur.  If you have trouble walking or keeping your balance, try using a cane for stability. If you feel dizzy or unstable, sit down right away.  Return to your normal activities as told by your health care provider. Ask your health care provider what activities are safe for you. General instructions  Take over-the-counter and prescription medicines only as told by your health care provider.  Avoid certain positions or movements as told by your health care provider.  Drink enough fluid to keep your urine clear or pale yellow.  Keep all follow-up visits as told  by your health care provider. This is important. Contact a health care provider if:  You have a fever.  Your condition gets worse or you develop new symptoms.  Your family or friends notice any behavioral changes.  Your nausea or vomiting gets worse.  You have numbness or a "pins and needles" sensation. Get help right  away if:  You have difficulty speaking or moving.  You are always dizzy.  You faint.  You develop severe headaches.  You have weakness in your legs or arms.  You have changes in your hearing or vision.  You develop a stiff neck.  You develop sensitivity to light. This information is not intended to replace advice given to you by your health care provider. Make sure you discuss any questions you have with your health care provider. Document Released: 06/30/2006 Document Revised: 02/28/2016 Document Reviewed: 01/15/2015 Elsevier Interactive Patient Education  [employer].

## 2017-07-28 ENCOUNTER — Telehealth: Payer: Self-pay | Admitting: Family Medicine

## 2017-07-28 LAB — HEPATITIS PANEL, ACUTE
HEP A IGM: NONREACTIVE
HEP B C IGM: NONREACTIVE
HEP C AB: NONREACTIVE
Hepatitis B Surface Ag: NONREACTIVE
SIGNAL TO CUT-OFF: 0.01 (ref <1.00)

## 2017-07-28 LAB — HIV ANTIBODY (ROUTINE TESTING W REFLEX): HIV: NONREACTIVE

## 2017-07-28 NOTE — Telephone Encounter (Signed)
Please call patient: - Her urine is normal - Macrocytosis: Her blood tests show worsening enlargement of her red blood cells (macrocytosis). They were 103.6 in July, currently 106.2.     - Please advised patient to refrain from any alcohol use. Continue B complex.    - I placed a referral to hematology, which is a blood specialist, to provide her their recommendations. - Her liver enzymes have greatly improved, however the ALT is still mildly elevated. All of the infectious causes have been ruled out with a negative HIV and hepatitis panel.    - I'm uncertain if this connected to her macrocytosis, it can be seen in liver disorders. I have ordered an ultrasound of her abdomen to rule out any potential conditions of the liver. They should be calling to schedule this for her.  - Headache/vertigo:   - I am uncertain if all these conditions are interconnected and would like to wait for ultrasound results before discussing potential medication for vertigo or headache. I would like her to start a daily magnesium supplement and continue her be complex. Both can help with headaches.   - Low-sodium diet, at least 80 ounces of water a day.   - Follow-up in 2 weeks. At that time we will have the imaging results for her ultrasound, and this will guide [place] on further plan.

## 2017-07-29 ENCOUNTER — Ambulatory Visit: Payer: Commercial Managed Care - PPO

## 2017-07-29 NOTE — Telephone Encounter (Signed)
Message left on voice mail for patient to return call. 

## 2017-07-29 NOTE — Telephone Encounter (Signed)
Patient notified and verbalized understanding. Appt scheduled for 2 weeks.

## 2017-07-30 NOTE — Telephone Encounter (Signed)
Noted. Pt was asked to make a follow up appt in 2 weeks regardless in which all labs results will be reviewed in detail.

## 2017-07-30 NOTE — Telephone Encounter (Signed)
Please review prior phone note with her. Both of her questions were answered in that phone note, she may just need it repeated to her since it is not something most non-medical people understand. Any further questions we can review in detail on on her follow up. Encourage her to write them down so she does not forget.

## 2017-07-30 NOTE — Telephone Encounter (Signed)
Spoke with patient reviewed information. Patient states she wanted the Dr to call her not a third party . Explained to patient if she wanted to review with the Dr I could set up an appt to review in detail . Patient declined appt.. Instructed patient to call back if she needed an appt.

## 2017-07-30 NOTE — Telephone Encounter (Signed)
Patient calling to request call back from pcp regarding liver enzyme results.  She states she previously spoke with clinic staff but is still confused by results.  She states she was told that her liver enzymes went down but the blood cells increased.  Patient is asking if this means the volume of the blood cells increased or did the cells themselves increased in size.   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  Tel # [phone]

## 2017-08-04 ENCOUNTER — Other Ambulatory Visit (HOSPITAL_BASED_OUTPATIENT_CLINIC_OR_DEPARTMENT_OTHER): Payer: Commercial Managed Care - PPO

## 2017-08-04 ENCOUNTER — Telehealth: Payer: Self-pay | Admitting: Family Medicine

## 2017-08-04 MED ORDER — TRAZODONE HCL 50 MG PO TABS: 50.0000 mg | ORAL_TABLET | Freq: Every evening | ORAL | 0 refills | Status: DC | PRN

## 2017-08-04 NOTE — Telephone Encounter (Signed)
Spoke with patient reviewed information and instructions. Patient verbalized understanding. 

## 2017-08-04 NOTE — Telephone Encounter (Signed)
Patient states she was previously offered a rx for sleeping aid by pcp.  She is agreeable and requesting script sent to [place].

## 2017-08-04 NOTE — Telephone Encounter (Signed)
I have called in trazodone for her to start 1 hour before bed.  Start with 1/2 tab for 2 nights, if not affective increase to one full tab. We will discuss results on need to taper medication at her upcoming appt in about a week.

## 2017-08-06 ENCOUNTER — Ambulatory Visit (HOSPITAL_BASED_OUTPATIENT_CLINIC_OR_DEPARTMENT_OTHER)
Admission: RE | Admit: 2017-08-06 | Discharge: 2017-08-06 | Disposition: A | Discharge status: Home / Self Care | Payer: Commercial Managed Care - PPO | Source: Ambulatory Visit | Attending: Family Medicine | Admitting: Family Medicine

## 2017-08-06 ENCOUNTER — Telehealth: Payer: Self-pay | Admitting: Family Medicine

## 2017-08-06 DIAGNOSIS — R7989 Other specified abnormal findings of blood chemistry: Secondary | ICD-10-CM

## 2017-08-06 DIAGNOSIS — Z9049 Acquired absence of other specified parts of digestive tract: Secondary | ICD-10-CM | POA: Diagnosis not present

## 2017-08-06 DIAGNOSIS — D7589 Other specified diseases of blood and blood-forming organs: Secondary | ICD-10-CM | POA: Insufficient documentation

## 2017-08-06 DIAGNOSIS — R1900 Intra-abdominal and pelvic swelling, mass and lump, unspecified site: Secondary | ICD-10-CM

## 2017-08-06 DIAGNOSIS — R42 Dizziness and giddiness: Secondary | ICD-10-CM

## 2017-08-06 DIAGNOSIS — R945 Abnormal results of liver function studies: Secondary | ICD-10-CM | POA: Diagnosis not present

## 2017-08-06 DIAGNOSIS — R519 Headache, unspecified: Secondary | ICD-10-CM

## 2017-08-06 DIAGNOSIS — E278 Other specified disorders of adrenal gland: Secondary | ICD-10-CM

## 2017-08-06 DIAGNOSIS — R51 Headache: Principal | ICD-10-CM

## 2017-08-06 DIAGNOSIS — R160 Hepatomegaly, not elsewhere classified: Secondary | ICD-10-CM | POA: Diagnosis not present

## 2017-08-06 DIAGNOSIS — D3501 Benign neoplasm of right adrenal gland: Secondary | ICD-10-CM | POA: Insufficient documentation

## 2017-08-06 NOTE — Telephone Encounter (Signed)
Left message for patient to return call.

## 2017-08-06 NOTE — Telephone Encounter (Signed)
I personally attempted to call pt to discuss her [place] abd. I Received an  unidentifiable voice mail message, therefore did not leave message.  -  Please try to call her back this afternoon.  - She has an upcoming appt to discuss all results of [place] and labs, however her [place] abd showed a mass in between her liver and kidney that may be an adrenal mass (adrenal glands sit on top of the kidneys).  - There are many types of adrenal masses that are ok and they are not a problem,but there are also some that cause symptoms (like headaches which she has and dizziness), and some that are cancerous.  - So we need to move forward quickly on getting and MRI of her abdomen and seeing what exactly this is. I am hoping we can get it completed in the next few days so we can discuss these results as well, in person at her appt next week.  If she asks, the mass is about 2.5 cm She will get a call to schedule the MRI.

## 2017-08-06 NOTE — Telephone Encounter (Signed)
Patient notified and verbalized understanding. She is requesting to get MRI ASAP. Diane notified and will get precert as soon as she can and call patient with appt.

## 2017-08-08 ENCOUNTER — Ambulatory Visit (HOSPITAL_BASED_OUTPATIENT_CLINIC_OR_DEPARTMENT_OTHER)
Admission: RE | Admit: 2017-08-08 | Discharge: 2017-08-08 | Disposition: A | Discharge status: Home / Self Care | Payer: Commercial Managed Care - PPO | Source: Ambulatory Visit | Attending: Family Medicine | Admitting: Family Medicine

## 2017-08-08 DIAGNOSIS — K760 Fatty (change of) liver, not elsewhere classified: Secondary | ICD-10-CM | POA: Insufficient documentation

## 2017-08-08 DIAGNOSIS — D3501 Benign neoplasm of right adrenal gland: Secondary | ICD-10-CM | POA: Diagnosis not present

## 2017-08-08 DIAGNOSIS — E278 Other specified disorders of adrenal gland: Secondary | ICD-10-CM

## 2017-08-08 DIAGNOSIS — R1900 Intra-abdominal and pelvic swelling, mass and lump, unspecified site: Secondary | ICD-10-CM | POA: Diagnosis present

## 2017-08-08 MED ORDER — GADOBENATE DIMEGLUMINE 529 MG/ML IV SOLN
15.0000 mL | Freq: Once | INTRAVENOUS | Status: AC | PRN
  Administered 2017-08-08: 14 mL via INTRAVENOUS

## 2017-08-10 ENCOUNTER — Telehealth: Payer: Self-pay | Admitting: Family Medicine

## 2017-08-10 NOTE — Telephone Encounter (Signed)
Patient called requesting results of her MRI.  She is very anxious about results.  Please advise.

## 2017-08-10 NOTE — Telephone Encounter (Signed)
ke with patient reviewed information patient request appt be changed to tomorrow rescheduled appt.

## 2017-08-10 NOTE — Telephone Encounter (Signed)
Please call patient, she has called multiple times for the results of her MRI which she has an appointment to receive results. - We will go over her labs, ultrasound and MRI in detail at her appointment. Briefly the MRI did not indicate she had a cancerous lesion.

## 2017-08-11 ENCOUNTER — Encounter: Payer: Self-pay | Admitting: Family Medicine

## 2017-08-11 ENCOUNTER — Ambulatory Visit (INDEPENDENT_AMBULATORY_CARE_PROVIDER_SITE_OTHER): Payer: Commercial Managed Care - PPO | Admitting: Family Medicine

## 2017-08-11 VITALS — BP 127/81 | HR 62 | Temp 98.0°F | Resp 20 | Wt 153.0 lb

## 2017-08-11 DIAGNOSIS — D7589 Other specified diseases of blood and blood-forming organs: Secondary | ICD-10-CM | POA: Diagnosis not present

## 2017-08-11 DIAGNOSIS — R42 Dizziness and giddiness: Secondary | ICD-10-CM | POA: Diagnosis not present

## 2017-08-11 DIAGNOSIS — R41840 Attention and concentration deficit: Secondary | ICD-10-CM

## 2017-08-11 DIAGNOSIS — G4452 New daily persistent headache (NDPH): Secondary | ICD-10-CM | POA: Insufficient documentation

## 2017-08-11 DIAGNOSIS — E279 Disorder of adrenal gland, unspecified: Secondary | ICD-10-CM

## 2017-08-11 DIAGNOSIS — E278 Other specified disorders of adrenal gland: Secondary | ICD-10-CM

## 2017-08-11 NOTE — Progress Notes (Signed)
Mikayla Tucker , 08/02/1961, 56 y.o., female MRN: [id number] Patient Care Team    Relationship Specialty Notifications Start End  Ma Hillock, DO PCP - General Family Medicine  08/07/15   Kennon Holter, NP Nurse Practitioner Nurse Practitioner  02/27/16    Comment: Gynecology  Gatha Mayer, MD Consulting Physician Gastroenterology  04/30/17     Chief Complaint  Patient presents with  . Results     Subjective:  Patient was brought in today to discuss all of her results surrounding her symptoms of headaches, sweating, dizziness, brain follow-up, elevated LFTs, macrocytosis. She originally felt many of her symptoms increased after a high sodium meal. CMP completed which resulted with much improved liver function tests, only mild elevation in ALT remained. Normal folate levels. Normal B-12 levels. Normal TSH. Normal hepatitis panel. HIV nonreactive. Hbg/hct 13.5/40.7. MCV 106.2, which is an increase from a few months prior 103.6. Abdominal ultrasound resulted with a larger but possibly normal common bile duct of 9 mm (status post cholecystectomy) and a adrenal mass. MRI of the abdomen resulted with right adrenal adenoma ~2.7 cm. patient continues to have headaches, although not daily multiple times a week. She also endorses still having the "brain follow-up ". She would like to pursue follow-up.   Prior notes: Pt presents for an OV with complaints of Dizziness and headaches of 4 weeks duration.  Associated symptoms include feeling like a "brain fog. " Patient has had vertigo in the past. She reports this is very similar to that sensation. She definitely feels that the right sided head movements worsen her symptoms. She reports the room spinning or moving has decreased, but still feels there is a "lag ". She noticed the symptoms seemed to increase after a high sodium meal. Symptoms only last a few seconds and self resolved. She never needed to take meclizine, although prescribed in the  past.  Elevated liver enzymes/macrocytosis: Patient has been seen in the past for mild macrocytosis that improved but never completely resolved. On her physical this year in July she was noted to have rather significant elevation in her liver enzymes AST 70/ALT 120. Her MCV was increasing again at that time to 103.6 from 100.7. Discussion in the past on alcohol consumption and patient denies more than 1 glass of wine a night. She reports that over the summer she may been having 2 glasses of wine a night. Prior workup for macrocytosis 02/27/2016 with folate 20.5, B-12 333, TSH 0.84   Abdominal ultrasound 08/06/2017: IMPRESSION: 1. Hypoechoic 2.6 x 2.1 x 2.5 cm mass located between the liver and right kidney. Suspect right adrenal mass. This finding warrants adrenal MR to further assess. If there is a contraindication to MRI, CT pre and post-contrast potentially could be helpful for further assessment as well. 2. Gallbladder absent. Common bile duct measures 9 mm in diameter which may be within normal limits for post cholecystectomy state. 3.  Study otherwise unremarkable.  MRI abdomen 08/08/2017: IMPRESSION: 1. 2.7 cm right adrenal adenoma. 2. Slight indentation along the right posterior liver margin due to a muscular strut of the hemidiaphragm. 3. Diffuse hepatic steatosis. 4. 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 Postoperative findings in the lumbar spine.  Depression screen [facility] 2/9 04/29/2017  Decreased Interest 0  Down, Depressed, Hopeless 0  PHQ - 2 Score 0    Allergies  Allergen Reactions  . Hydrocodone-Acetaminophen Hives   Social History   Tobacco Use  . Smoking status: Former Smoker  Packs/day: 1.00    Years: 10.00    Pack years: 10.00  . Smokeless tobacco: Never Used  . Tobacco comment: quit 56 yrs old  Substance Use Topics  . Alcohol use: Yes    Alcohol/week: 4.2 oz    Types: 7 Glasses of wine per week   Past Medical History:  Diagnosis Date  .  Allergy   . Hyperlipidemia   . Squamous cell skin cancer    SCC   Past Surgical History:  Procedure Laterality Date  . APPENDECTOMY    . BACK SURGERY    . BREAST SURGERY     biopsy  . CHOLECYSTECTOMY    . COLONOSCOPY    . LIPOMA EXCISION     under breast on R side   Family History  Adopted: Yes  Problem Relation Age of Onset  . Colon cancer Neg Hx    Allergies as of 08/11/2017      Reactions   Hydrocodone-acetaminophen Hives      Medication List        Accurate as of 08/11/17 11:59 PM. Always use your most recent med list.          atorvastatin 80 MG tablet Commonly known as:  LIPITOR Take 1 tablet (80 mg total) by mouth daily.   b complex vitamins capsule Take 1 capsule by mouth daily.   Cholecalciferol 1000 units tablet Take 3,000 Units by mouth daily.   fexofenadine 180 MG tablet Commonly known as:  ALLEGRA Take 180 mg by mouth daily.   fluticasone 50 MCG/ACT nasal spray Commonly known as:  FLONASE Place 2 sprays into both nostrils daily.   LORazepam 1 MG tablet Commonly known as:  ATIVAN Take 1 tablet (1 mg total) by mouth 2 (two) times daily as needed for anxiety.   traZODone 50 MG tablet Commonly known as:  DESYREL Take 1 tablet (50 mg total) by mouth at bedtime as needed for sleep.       All past medical history, surgical history, allergies, family history, immunizations andmedications were updated in the EMR today and reviewed under the history and medication portions of their EMR.     ROS: Negative, with the exception of above mentioned in HPI   Objective:  BP 127/81 (BP Location: Right Arm, Patient Position: Sitting, Cuff Size: Normal)   Pulse 62   Temp 98 F (36.7 C)   Resp 20   Wt 153 lb (69.4 kg)   SpO2 96%   BMI 26.26 kg/m  Body mass index is 26.26 kg/m. Gen: Afebrile. No acute distress. Nontoxic in appearance, well developed, well nourished. Pleasant Caucasian female. HENT: AT. [place].  MMM, no oral lesions.  Eyes:Pupils Equal  Round Reactive to light, Extraocular movements intact,  Conjunctiva without redness, discharge or icterus. CV: RRR Chest: CTAB, no wheeze or crackles.  Abd: Soft.NTND. BS present.  Neuro:  Normal gait. PERLA. EOMi. Alert. Oriented x3  Psych: Mildly anxious today, otherwise Normal affect, dress and demeanor. Normal speech. Normal thought content and judgment.  No exam data present No results found. No results found for this or any previous visit (from the past 24 hour(s)).  Assessment/Plan: Mikayla Tucker is a 56 y.o. female present for OV for  Macrocytosis without anemia/elevated LFT/adrenal mass - Uncertain continue cause of worsening macrocytosis. Discuss labs in detail with patient today and she had a normal folate, TSH and B-12 levels. She denies greater than 1 alcoholic drink a night.  - Liver function test have almost returned to  normal. Infectious disease screening negative. - Ultrasound with enlarged common bile duct of 9 mm, which is read as likely normal secondary to cholecystectomy. - Adrenal mass consistent with 2.7 cm adrenal adenoma by MRI. Patient agreeable to referral to endocrinology, order placed today. - Discussed hematology referral for the macrocytosis, and will wait on neurology and endocrinology's opinions.  New persistent daily headache/Dizziness/Attention deficit - At this point uncertain if related to adrenal mass macrocytosis etc.  - Patient agreeable for neurology referral for their opinion since headaches are becoming more persistent, accompanied by increased dizziness and "brain fog" - Ambulatory referral to Neurology    Reviewed expectations re: course of current medical issues.  Discussed self-management of symptoms.  Outlined signs and symptoms indicating need for more acute intervention.  Patient verbalized understanding and all questions were answered.  Patient received an After-Visit Summary.    Orders Placed This Encounter  Procedures  .  Ambulatory referral to Neurology  . Ambulatory referral to Endocrinology     Note is dictated utilizing voice recognition software. Although note has been proof read prior to signing, occasional typographical errors still can be missed. If any questions arise, please do not hesitate to call for verification.   electronically signed by:  Howard Pouch, DO  [place]

## 2017-08-11 NOTE — Patient Instructions (Signed)
I am referring you to endocrine and neurology for further evaluation.   I am sorry you are still having symptoms.  Start magnesium and B12.

## 2017-08-11 NOTE — Telephone Encounter (Signed)
See other phone note dated 11.5.18.  Results given to patient.

## 2017-08-12 ENCOUNTER — Ambulatory Visit: Payer: Commercial Managed Care - PPO | Admitting: Family Medicine

## 2017-08-12 ENCOUNTER — Encounter: Payer: Self-pay | Admitting: Family Medicine

## 2017-08-12 ENCOUNTER — Encounter: Payer: Self-pay | Admitting: Neurology

## 2017-08-17 ENCOUNTER — Encounter: Payer: Self-pay | Admitting: Neurology

## 2017-08-17 ENCOUNTER — Ambulatory Visit: Payer: Commercial Managed Care - PPO | Admitting: Neurology

## 2017-08-17 ENCOUNTER — Telehealth: Payer: Self-pay | Admitting: Radiology

## 2017-08-17 VITALS — Ht 64.0 in | Wt 157.0 lb

## 2017-08-17 DIAGNOSIS — J349 Unspecified disorder of nose and nasal sinuses: Secondary | ICD-10-CM | POA: Diagnosis not present

## 2017-08-17 DIAGNOSIS — R51 Headache: Secondary | ICD-10-CM | POA: Diagnosis not present

## 2017-08-17 DIAGNOSIS — R55 Syncope and collapse: Secondary | ICD-10-CM | POA: Diagnosis not present

## 2017-08-17 DIAGNOSIS — H8110 Benign paroxysmal vertigo, unspecified ear: Secondary | ICD-10-CM | POA: Diagnosis not present

## 2017-08-17 DIAGNOSIS — G441 Vascular headache, not elsewhere classified: Secondary | ICD-10-CM | POA: Diagnosis not present

## 2017-08-17 DIAGNOSIS — R42 Dizziness and giddiness: Secondary | ICD-10-CM

## 2017-08-17 DIAGNOSIS — R519 Headache, unspecified: Secondary | ICD-10-CM

## 2017-08-17 MED ORDER — AMITRIPTYLINE HCL 10 MG PO TABS
10.0000 mg | ORAL_TABLET | Freq: Every day | ORAL | 3 refills | Status: DC
Start: 2017-08-17 — End: 2017-08-26

## 2017-08-17 NOTE — Patient Instructions (Signed)

## 2017-08-17 NOTE — Telephone Encounter (Signed)
Left a voicemail for patient to call back about scheduling mra.

## 2017-08-17 NOTE — Progress Notes (Signed)
[facility]   Provider:  Larey Seat, [place] D  Primary Care Physician:  Ma Hillock, DO   Referring Provider: Ma Hillock, DO    Chief Complaint  Patient presents with  . New Patient (Initial Visit)    pt alone, rm 10. pt states about 6 wks she has been having headaches and dizziness. the headaches have somewhat subsided but the dizziness is still present.    HPI:  Mikayla Tucker is a 56 y.o. female , seen here as in a referral  from Dr. Raoul Pitch for headaches associated with dizziness.   Chief complaint according to patient : 6 weeks ago the patient suffered an acute onset of dizziness that seem to have vertiginous character.  She felt moving when she was not in motion, she was mildly nauseated '" squeezy".  Certain movements of the head provoked this vertigo that lasted longer than the original movement.  The sensation would go and come as it pleased.  2 times she suffered a spell that she best described as a blackout.  She felt as if her brain switched off and after a short period of seconds rebooted itself. She noted ear aches and the feeling of fluid in the ear- right side. Takes allergy preventive meds every day.  She does not recall if her vision changed, if there was another sensation of lack of awareness, or loss of consciousness. Over the last 2 weeks or so she has not had 1 of these rebooting spells, and she has had less headaches but again more dizziness. The headaches are described throbbing, not burning, pulsating sensation- lasting minutes to an hour. 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  The headaches come and go but she has daily bouts of dizziness as well.,  She is not sure that headache and dizziness are related.  However they both started about 6 weeks ago.  She had wondered if the alcohol based and acrylic paint that she uses could have caused some of  the headaches.  During the same timeframe she also developed elevated liver enzymes, she sees Dr. Gatha Mayer gastroenterologist about the elevated liver enzymes and the feeling of abdominal distention.  She was diagnosed with a fatty liver, microcytic anemia, an MRI of the abdomen of August 08, 2017 was interpreted as showing a right adrenal adenoma 2.7 cm hypoechoic 2.5 by 2.6 by 2.1 centimeter mass between the liver and right kidney.  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   Medical history: The patient underwent a discectomy with fusion and lumbar cage L4-5 in 2001.  Cholecystectomy over 10 years ago.  Microcytosis,  elevated LFT and adrenal mass.   Social history: married, 1 child ( daughter 66 at [facility])  7 alcohol drinks/ week. Stopped 10 days ago. Quit smoking 1985, caffeine -2 cups of coffee a day, no energy drinks or sodas.  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    Review of Systems: Out of a complete 14 system review, the patient complains of only the following symptoms, and all other reviewed systems are negative.    Social History   Socioeconomic History  . Marital status: Married    Spouse name: Not on file  . Number  of children: 1  . Years of education: Not on file  . Highest education level: Not on file  Social Needs  . Financial resource strain: Not on file  . Food insecurity - worry: Not on file  . Food insecurity - inability: Not on file  . Transportation needs - medical: Not on file  . Transportation needs - non-medical: Not on file  Occupational History    Employer: [employer]    Comment: homemaker  Tobacco Use  . Smoking status: Former Smoker    Packs/day: 1.00    Years: 10.00    Pack years: 10.00  . Smokeless tobacco: Never Used  . Tobacco comment: quit 56 yrs old  Substance and Sexual Activity  . Alcohol use: Yes      Alcohol/week: 4.2 oz    Types: 7 Glasses of wine per week  . Drug use: No  . Sexual activity: Yes    Birth control/protection: Post-menopausal  Other Topics Concern  . Not on file  Social History Narrative   Mikayla Tucker lives with her husband and daughter. She is originally from [place]-moved to [place]. She relocated to [facility] about [place] from [place].    Family History  Adopted: Yes  Problem Relation Age of Onset  . Colon cancer Neg Hx     Past Medical History:  Diagnosis Date  . Allergy   . Hyperlipidemia   . Squamous cell skin cancer    SCC    Past Surgical History:  Procedure Laterality Date  . APPENDECTOMY    . BACK SURGERY    . BREAST SURGERY     biopsy  . CHOLECYSTECTOMY    . COLONOSCOPY    . LIPOMA EXCISION     under breast on R side    Current Outpatient Medications  Medication Sig Dispense Refill  . atorvastatin (LIPITOR) 80 MG tablet Take 1 tablet (80 mg total) by mouth daily. 90 tablet 3  . b complex vitamins capsule Take 1 capsule by mouth daily.    . Cholecalciferol 1000 units tablet Take 3,000 Units by mouth daily.     . fexofenadine (ALLEGRA) 180 MG tablet Take 180 mg by mouth daily.    . fluticasone (FLONASE) 50 MCG/ACT nasal spray Place 2 sprays into both nostrils daily. 16 g 3  . LORazepam (ATIVAN) 1 MG tablet Take 1 tablet (1 mg total) by mouth 2 (two) times daily as needed for anxiety. 60 tablet 5  . traZODone (DESYREL) 50 MG tablet Take 1 tablet (50 mg total) by mouth at bedtime as needed for sleep. 30 tablet 0   No current facility-administered medications for this visit.     Allergies as of 08/17/2017 - Review Complete 08/17/2017  Allergen Reaction Noted  . Hydrocodone-acetaminophen Hives 12/20/2014    Vitals: Ht 5\' 4"  (1.626 m)   Wt 157 lb (71.2 kg)   BMI 26.95 kg/m  Last Weight:  Wt Readings from Last 1 Encounters:  08/17/17 157 lb (71.2 kg)   VOJ:JKKX mass index is 26.95 kg/m.     Last Height:   Ht Readings from Last 1  Encounters:  08/17/17 5\' 4"  (1.626 m)    Physical exam:  General: The patient is awake, alert and appears not in acute distress. The patient is well groomed. Head: Normocephalic, atraumatic. Neck is supple. Mallampati 3-4,  neck circumference:15. 75. Nasal airflow patent , Cardiovascular:  Regular rate and rhythm, without  murmurs or carotid bruit, and without distended neck veins.  Respiratory: Lungs are clear to auscultation.Skin:  Without evidence of edema, or rash. Trunk: BMI is 27.  Neurologic exam : The patient is awake and alert, oriented to place and time.  Attention span & concentration ability appears normal. Speech is fluent, without dysarthria, dysphonia or aphasia. Mood and affect are appropriate.  Cranial nerves: Pupils are equal and briskly reactive to light. Extraocular movements  in vertical and horizontal planes intact and without nystagmus. Visual fields by finger perimetry are intact.Hearing to finger rub intact.  Facial sensation intact to fine touch. Facial motor strength is symmetric, and tongue and uvula moved midline. Shoulder shrug was symmetrical.  There is normal tongue protrusion into the cheek, Motor exam: Normal tone, muscle bulk and symmetric strength in all extremities. Sensory:  Fine touch, pinprick and vibration were tested in all extremities.  Coordination:  Finger-to-nose maneuver  normal without evidence of ataxia, dysmetria or tremor. Gait and station: Patient walks without assistive device .Tandem gait is unfragmented. Turns with  3  Steps. Romberg testing is negative. Deep tendon reflexes: in the lower extremities are attenuated- unable to elicit - Babinski maneuver response is downgoing.   Assessment:  After physical and neurologic examination, review of laboratory studies,  Personal review of imaging studies, reports of other /same  Imaging studies, results of polysomnography and / or neurophysiology testing and pre-existing records as far as provided  in visit., my assessment is   1) to possibly independent symptoms began 6-8 weeks ago, vertigo spells and headaches.  The symptoms seem not to be going hand-in-hand at the same time.  The patient also has noticed a feeling of fluid in her right ear, and I suspect that her vertigo may be allergy related.  She has used Advertising account planner daily, and still dizzy spells.ocuured daily.  She feels that the movements provoking vertigo include bending and extending as well as rotating but not tilting -if she looks up towards the vertigo is the strongest. Her vertigo is very strong when she wakes up in a dark room in the middle of the night while having no visual orientation points.  1)BPV ?  2)No nausea, not migrainous ? Bright light is bother some and loud sounds.   I would like to refer her for vestibular rehab, which can be performed next door at [facility] neuro rehab. I also will order an MRI of the brain I agree with her PCP that prescribing Antivert is not a good idea since her spells last only seconds to minutes.  On the other hand she may need a daily preventative for headaches and dizziness given the frequency of the attacks and not the duration.  I would suggest a tricyclic such as low-dose imipramine as well has the potential to lower inner ear pressure/  I wished her good luck for her further evaluation of microcytosis, and a hopefully benign adrenal mass.   The patient was advised of the nature of the diagnosed disorder , the treatment options and the  risks for general health and wellness arising from not treating the condition.   I spent more than 45 minutes of face to face time with the patient.  Greater than 50% of time was spent in counseling and coordination of care. We have discussed the diagnosis and differential and I answered the patient's questions.    Plan:  MRI brain, vest. Rehab and elavil at night/ No more ETOH - water and low carb, no high fructose Corn syrup.     Tariya Morrissette  Amaya Blakeman, MD 58/25/1898, 42:10 AM  Certified in Neurology by ABPN Certified in [place] by [facility] [address] [place], [place] [address]

## 2017-08-17 NOTE — Telephone Encounter (Signed)
Pt returned Emily's call °

## 2017-08-18 ENCOUNTER — Telehealth: Payer: Self-pay | Admitting: Family Medicine

## 2017-08-18 NOTE — Telephone Encounter (Signed)
I dont see that recommendation in The note form Neurology. Please advise.

## 2017-08-18 NOTE — Telephone Encounter (Signed)
Patient saw Dr Dohmerier  And Dr made the recommendation to get the abdominal mass biopsied.  Please advise.

## 2017-08-18 NOTE — Telephone Encounter (Signed)
Spoke with patient clarified with her she is seeing Endocrinology and they will make recommendation if biopsy is necessary. Patient verbalized understanding.

## 2017-08-18 NOTE — Telephone Encounter (Signed)
I am uncertain the question being asked, or was this an FYI? Pt had been referred to endocrine to evaluate and advise on adrenal adenoma. They will be guiding on adenoma.

## 2017-08-26 ENCOUNTER — Telehealth: Payer: Self-pay | Admitting: Neurology

## 2017-08-26 MED ORDER — AMITRIPTYLINE HCL 10 MG PO TABS: 5.0000 mg | ORAL_TABLET | Freq: Every day | ORAL | 3 refills | Status: DC

## 2017-08-26 NOTE — Telephone Encounter (Signed)
RN call patient about her elavil rx. Rn stated per Dr. Brett Fairy she cannot find a 5mg  dose. The pt can cut the pill in half,and take half at night.Pt wanted to know if the pharmacy can cut the pills in half for her. Rn advised patient to ask the pharmacy about them cutting it in half. Pt verbalized understanding. Rx fax to [facility] at [phone]. Fax receive.

## 2017-08-26 NOTE — Telephone Encounter (Signed)
I cannot find a 5 mg dose, I believe 10 mg tabs are the lowest manufactured dose. Since it is a tablet and can be cut in two let patient take a half tab every night. I will adjust the script accordingly 45 tab are a 3 month supply in the future. CD

## 2017-08-26 NOTE — Telephone Encounter (Signed)
Pt called she since starting  amitriptyline (ELAVIL) 10 MG tablet she is in a fog the next day. She broke it half last night and seems to better today. She is wanting to know if 5mg  tabs could be [facility] to [facility].

## 2017-08-31 ENCOUNTER — Telehealth: Payer: Self-pay | Admitting: *Deleted

## 2017-08-31 NOTE — Telephone Encounter (Signed)
Received request for Trazodone last refill was 08/04/17. Last office visit 08/11/17 for adrenal mass. Recently started on Elavil by neurology. Do you want to refill? Please advise.

## 2017-08-31 NOTE — Telephone Encounter (Signed)
Please call pt: - I have received a refill request for trazodone for pt. She has an appt tomorrow for sinus infection, will discuss with her at thatappt

## 2017-09-01 ENCOUNTER — Encounter: Payer: Self-pay | Admitting: Family Medicine

## 2017-09-01 ENCOUNTER — Ambulatory Visit: Payer: Commercial Managed Care - PPO | Admitting: Family Medicine

## 2017-09-01 ENCOUNTER — Other Ambulatory Visit: Payer: Commercial Managed Care - PPO

## 2017-09-01 VITALS — BP 134/85 | HR 98 | Temp 98.2°F | Resp 20 | Wt 154.5 lb

## 2017-09-01 DIAGNOSIS — J0100 Acute maxillary sinusitis, unspecified: Secondary | ICD-10-CM | POA: Diagnosis not present

## 2017-09-01 MED ORDER — METHYLPREDNISOLONE ACETATE 80 MG/ML IJ SUSP
80.0000 mg | Freq: Once | INTRAMUSCULAR | Status: AC
  Administered 2017-09-01: 80 mg via INTRAMUSCULAR

## 2017-09-01 MED ORDER — AMOXICILLIN-POT CLAVULANATE 875-125 MG PO TABS: 1.0000 | ORAL_TABLET | Freq: Two times a day (BID) | ORAL | 0 refills | Status: DC

## 2017-09-01 NOTE — Progress Notes (Signed)
Mikayla Tucker , [birth year], 56 y.o., female MRN: [id number] Patient Care Team    Relationship Specialty Notifications Start End  Mikayla Hillock, DO PCP - General Family Medicine  08/07/15   Mikayla Holter, NP Nurse Practitioner Nurse Practitioner  02/27/16    Comment: Gynecology  Mikayla Mayer, MD Consulting Physician Gastroenterology  04/30/17     Chief Complaint  Patient presents with  . Sinusitis    x 1 week     Subjective: Pt presents for an OV with complaints of one week onset of hoarseness, sore throat that has now progressed to sinus pressure and pain, nasal congestion, runny nose, ear fullness. She denies fever, chills, nausea, vomiting or diarrhea. She is starting to feel fatigued and decreased appetite. She has tried over-the-counter sinus therapies and has started using the Nettie pot.  Depression screen [facility] 2/9 04/29/2017  Decreased Interest 0  Down, Depressed, Hopeless 0  PHQ - 2 Score 0    Allergies  Allergen Reactions  . Hydrocodone-Acetaminophen Hives   Social History   Tobacco Use  . Smoking status: Former Smoker    Packs/day: 1.00    Years: 10.00    Pack years: 10.00  . Smokeless tobacco: Never Used  . Tobacco comment: quit 56 yrs old  Substance Use Topics  . Alcohol use: Yes    Alcohol/week: 4.2 oz    Types: 7 Glasses of wine per week   Past Medical History:  Diagnosis Date  . Allergy   . Hyperlipidemia   . Squamous cell skin cancer    SCC   Past Surgical History:  Procedure Laterality Date  . APPENDECTOMY    . BACK SURGERY    . BREAST SURGERY     biopsy  . CHOLECYSTECTOMY    . COLONOSCOPY    . LIPOMA EXCISION     under breast on R side   Family History  Adopted: Yes  Problem Relation Age of Onset  . Colon cancer Neg Hx    Allergies as of 09/01/2017      Reactions   Hydrocodone-acetaminophen Hives      Medication List        Accurate as of 09/01/17 11:35 AM. Always use your most recent med list.            amitriptyline 10 MG tablet Commonly known as:  ELAVIL Take 0.5 tablets (5 mg total) by mouth at bedtime.   atorvastatin 80 MG tablet Commonly known as:  LIPITOR Take 1 tablet (80 mg total) by mouth daily.   b complex vitamins capsule Take 1 capsule by mouth daily.   Cholecalciferol 1000 units tablet Take 3,000 Units by mouth daily.   fexofenadine 180 MG tablet Commonly known as:  ALLEGRA Take 180 mg by mouth daily.   fluticasone 50 MCG/ACT nasal spray Commonly known as:  FLONASE Place 2 sprays into both nostrils daily.   LORazepam 1 MG tablet Commonly known as:  ATIVAN Take 1 tablet (1 mg total) by mouth 2 (two) times daily as needed for anxiety.       All past medical history, surgical history, allergies, family history, immunizations andmedications were updated in the EMR today and reviewed under the history and medication portions of their EMR.     ROS: Negative, with the exception of above mentioned in HPI   Objective:  BP 134/85 (BP Location: Right Arm, Patient Position: Sitting, Cuff Size: Normal)   Pulse 98   Temp 98.2 F (36.8 C)  Resp 20   Wt 154 lb 8 oz (70.1 kg)   SpO2 97%   BMI 26.52 kg/m  Body mass index is 26.52 kg/m. Gen: Afebrile. No acute distress. Nontoxic in appearance, well developed, well nourished. Pleasant Caucasian female. HENT: AT. [place]. Bilateral TM visualized right fullness, left normal, no erythema bulging bilateral. MMM, no oral lesions. Bilateral nares with erythema and drainage. Throat without erythema or exudates. Postnasal drip present. Mild cough present. Hoarseness present. Tender to palpation maxillary sinus. Eyes:Pupils Equal Round Reactive to light, Extraocular movements intact,  Conjunctiva without redness, discharge or icterus. Neck/lymp/endocrine: Supple, no lymphadenopathy CV: RRR  Chest: CTAB, no wheeze or crackles. Good air movement, normal resp effort.  Abd: Soft.NTND. BS present.  Skin: no rashes, purpura or  petechiae.   No exam data present No results found. No results found for this or any previous visit (from the past 24 hour(s)).  Assessment/Plan: Mikayla Tucker is a 56 y.o. female present for OV for  1. Acute maxillary sinusitis, recurrence not specified Rest, hydrate.  + flonase, mucinex (DM if cough), nettie pot or nasal saline.  Augmentin  prescribed, take until completed.  Steroid shot provided today.  If cough present it can last up to 6-8 weeks.  F/U 2 weeks of not improved.    Reviewed expectations re: course of current medical issues.  Discussed self-management of symptoms.  Outlined signs and symptoms indicating need for more acute intervention.  Patient verbalized understanding and all questions were answered.  Patient received an After-Visit Summary.    No orders of the defined types were placed in this encounter.    Note is dictated utilizing voice recognition software. Although note has been proof read prior to signing, occasional typographical errors still can be missed. If any questions arise, please do not hesitate to call for verification.   electronically signed by:  Mikayla Pouch, DO  [place]

## 2017-09-01 NOTE — Telephone Encounter (Signed)
Spoke with patient she is not taking trazodone due to side effects

## 2017-09-01 NOTE — Patient Instructions (Addendum)
Rest, hydrate.  + flonase, mucinex (DM if cough), nettie pot or nasal saline.  Augmentin  prescribed, take until completed.  Steroid shout provided today.     Sinusitis, Adult Sinusitis is soreness and inflammation of your sinuses. Sinuses are hollow spaces in the bones around your face. They are located:  Around your eyes.  In the middle of your forehead.  Behind your nose.  In your cheekbones.  Your sinuses and nasal passages are lined with a stringy fluid (mucus). Mucus normally drains out of your sinuses. When your nasal tissues get inflamed or swollen, the mucus can get trapped or blocked so air cannot flow through your sinuses. This lets bacteria, viruses, and funguses grow, and that leads to infection. Follow these instructions at home: Medicines  Take, use, or apply over-the-counter and prescription medicines only as told by your doctor. These may include nasal sprays.  If you were prescribed an antibiotic medicine, take it as told by your doctor. Do not stop taking the antibiotic even if you start to feel better. Hydrate and Humidify  Drink enough water to keep your pee (urine) clear or pale yellow.  Use a cool mist humidifier to keep the humidity level in your home above 50%.  Breathe in steam for 10-15 minutes, 3-4 times a day or as told by your doctor. You can do this in the bathroom while a hot shower is running.  Try not to spend time in cool or dry air. Rest  Rest as much as possible.  Sleep with your head raised (elevated).  Make sure to get enough sleep each night. General instructions  Put a warm, moist washcloth on your face 3-4 times a day or as told by your doctor. This will help with discomfort.  Wash your hands often with soap and water. If there is no soap and water, use hand sanitizer.  Do not smoke. Avoid being around people who are smoking (secondhand smoke).  Keep all follow-up visits as told by your doctor. This is important. Contact a  doctor if:  You have a fever.  Your symptoms get worse.  Your symptoms do not get better within 10 days. Get help right away if:  You have a very bad headache.  You cannot stop throwing up (vomiting).  You have pain or swelling around your face or eyes.  You have trouble seeing.  You feel confused.  Your neck is stiff.  You have trouble breathing. This information is not intended to replace advice given to you by your health care provider. Make sure you discuss any questions you have with your health care provider. Document Released: 03/10/2008 Document Revised: 05/18/2016 Document Reviewed: 07/18/2015 Elsevier Interactive Patient Education  [employer].

## 2017-09-01 NOTE — Addendum Note (Signed)
Addended by: Leota Jacobsen on: 09/01/2017 12:01 PM   Modules accepted: Orders

## 2017-09-03 ENCOUNTER — Ambulatory Visit: Payer: Commercial Managed Care - PPO

## 2017-09-08 ENCOUNTER — Ambulatory Visit: Payer: Commercial Managed Care - PPO

## 2017-09-08 ENCOUNTER — Other Ambulatory Visit: Payer: Self-pay | Admitting: Neurology

## 2017-09-08 DIAGNOSIS — G44001 Cluster headache syndrome, unspecified, intractable: Secondary | ICD-10-CM

## 2017-09-09 ENCOUNTER — Ambulatory Visit: Payer: Commercial Managed Care - PPO | Attending: Neurology

## 2017-09-09 DIAGNOSIS — R42 Dizziness and giddiness: Secondary | ICD-10-CM | POA: Insufficient documentation

## 2017-09-09 DIAGNOSIS — H8113 Benign paroxysmal vertigo, bilateral: Secondary | ICD-10-CM | POA: Insufficient documentation

## 2017-09-09 DIAGNOSIS — R2689 Other abnormalities of gait and mobility: Secondary | ICD-10-CM

## 2017-09-09 MED ORDER — GADOPENTETATE DIMEGLUMINE 469.01 MG/ML IV SOLN: 14.0000 mL | Freq: Once | INTRAVENOUS | Status: DC | PRN

## 2017-09-09 NOTE — Therapy (Signed)
[facility] [address] [place], [place], [address] Phone: [phone]   Fax:  [phone]  Physical Therapy Evaluation  Patient Details  Name: Mikayla Tucker MRN: [id number] Date of Birth: 1961/02/08 Referring Provider: Dr. Brett Fairy   Encounter Date: 09/09/2017  PT End of Session - 09/09/17 1138    Visit Number  1    Number of Visits  5    Date for PT Re-Evaluation  10/09/17    Authorization Type  [employer]-60 visit combined limit    Authorization - Visit Number  1    Authorization - Number of Visits  60    PT Start Time  9628    PT Stop Time  1018    PT Time Calculation (min)  47 min    Activity Tolerance  Patient tolerated treatment well    Behavior During Therapy  [facility] for tasks assessed/performed       Past Medical History:  Diagnosis Date  . Allergy   . Hyperlipidemia   . Squamous cell skin cancer    SCC    Past Surgical History:  Procedure Laterality Date  . APPENDECTOMY    . BACK SURGERY    . BREAST SURGERY     biopsy  . CHOLECYSTECTOMY    . COLONOSCOPY    . LIPOMA EXCISION     under breast on R side    There were no vitals filed for this visit.   Subjective Assessment - 09/09/17 0940    Subjective  Pt reported dizziness began late August, she experienced vertigo in the middle of the night. Pt reports it has been getting better but still there and more frequent. Pt had two episodes and she felt like her brain "turned off", lasted a few seconds. 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 She feels disoriented in dark rooms. Pt had fluid in R ear and has hx of allergies. Pt also reported HAs began shortly after dizziness began and HAs have subsided. Dizziness at best 0/10.  Pt denied hearing loss or tinnitus.     Pertinent History  Allergies, HLD, hx of prediabetes, hx of colon polyp    Patient Stated Goals  No dizziness, prior to  starting job in March.     Currently in Pain?  No/denies         [facility] PT Assessment - 09/09/17 0950      Assessment   Medical Diagnosis  BPV, B headaches, sinus disease    Referring Provider  Dr. Brett Fairy    Onset Date/Surgical Date  05/30/17 late night    Hand Dominance  Right    Prior Therapy  none      Precautions   Precautions  Fall    Precaution Comments  pt is cautious       Restrictions   Weight Bearing Restrictions  No      Balance Screen   Has the patient fallen in the past 6 months  No    Has the patient had a decrease in activity level because of a fear of falling?   Yes    Is the patient reluctant to leave their home because of a fear of falling?   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      Ambulation/Gait   Ambulation/Gait  Yes    Ambulation/Gait Assistance  7: Independent    Ambulation Distance (Feet)  100 Feet    Assistive device  None    Gait Pattern  Step-through pattern;Within Functional Limits     Ambulation Surface  Level;Indoor    Gait Comments  Will assess gait after vertigo cleared and write goal as indicated.          Vestibular Assessment - 09/09/17 0955      Symptom Behavior   Type of Dizziness  Comment pt feels like brain is doing a somersault    Frequency of Dizziness  Daily    Duration of Dizziness  <30 seconds    Aggravating Factors  Lying supine;Looking up to the ceiling;Turning head quickly;Rolling to right;Rolling to left    Relieving Factors  Rest;Slow movements      Occulomotor Exam   Occulomotor Alignment  Normal    Spontaneous  Absent    Gaze-induced  Absent    Smooth Pursuits  Intact    Saccades  Intact    Comment  Pt denied dizziness. B HIT (-).      Vestibulo-Occular Reflex   VOR 1 Head Only (x 1 viewing)  1/10 dizziness and pt had difficulty maintaing constant speed    VOR Cancellation  Normal pt reported a "tad" of dizziness, 1/10.      Positional Testing   Dix-Hallpike  Dix-Hallpike Right;Dix-Hallpike Left    Horizontal Canal Testing  Horizontal Canal Right;Horizontal Canal Left      Dix-Hallpike Right   Dix-Hallpike Right Duration  < 30sec. with 7/10 dizziness.    Dix-Hallpike Right Symptoms  Upbeat, right rotatory nystagmus      Positional Sensitivities   Up from Right Hallpike  Moderate dizziness         Objective measurements completed on examination: See above findings.       Vestibular Treatment/Exercise - 09/09/17 1014      Vestibular Treatment/Exercise   Vestibular Treatment Provided  Canalith Repositioning    Canalith Repositioning  Epley Manuever Right       EPLEY MANUEVER RIGHT   Number of Reps   2    Overall Response  Improved Symptoms    Response Details   Pt reported 1-2/10 dizziness vs. 7/10 dizziness after treatment. Nystagmus amplitude also decr. after treatment.             PT Education -  09/09/17 1137    Education provided  Yes    Education Details  PT discussed PT frequency, duration or POC. PT  educated pt on BPPV.     Person(s) Educated  Patient    Methods  Explanation    Comprehension  Verbalized understanding       PT Short Term Goals - 09/09/17 1143      PT SHORT TERM GOAL #1   Title  same as LTGs        PT Long Term Goals - 09/09/17 1143      PT LONG TERM GOAL #1   Title  Pt will be IND in HEP to improve dizziness and balance. TARGET DATE FOR ALL LTGS: 10/07/17    Status  New      PT LONG TERM GOAL #2   Title  PT will perform FGA and write goal as indicated.     Status  New      PT LONG TERM GOAL #3   Title  Pt will report 0/10 dizziness during all positional testing.     Status  New      PT LONG TERM GOAL #4   Title  Write amb. goal as indicated once vertigo resolved.     Status  New             Plan - 09/09/17 1139    Clinical Impression Statement  Pt is a pleasant 56y/o female presenting to OPPT neuro for vertigo, B headaches (which pt reports are resolving), sinus disease. Pt's PMH significant for the following: Allergies, HLD, hx of prediabetes, hx of colon polyp. PT only able to perform R Dix-Hallpike and R Epley treatment 2/2 pt's s/s and time constraints. Pt experienced R upbeating torsional nystagmus and 7/10 dizziness during R Dix-Hallpike indicated R pBPPV, with reduced s/s after treatment. PT will assess all canals next session. PT will also formally assess balance after vertigo is resolved. Pt experienced the following impairments during exam: dizziness and reports of imbalance during dizziness which could affect gait, pt reported dizziness during VOR indicating vestibular hypofunction. Pt would benefit from skilled PT to improve safety during functional mobility.     History and Personal Factors relevant to plan of care:  pt works and is active at work (lifting, Investment banker, operational, driving)    Clinical Presentation  Stable    Clinical Presentation due to:  Allergies, HLD, hx of prediabetes, hx of colon polyp    Clinical Decision Making  Moderate     Rehab Potential  Good    Clinical Impairments Affecting Rehab Potential  see above    PT Frequency  1x / week    PT Duration  4 weeks    PT Treatment/Interventions  ADLs/Self Care Home Management;Biofeedback;Canalith Repostioning;Therapeutic activities;Therapeutic exercise;Manual techniques;Vestibular;Functional mobility training;Stair training;Gait training;DME Instruction;Patient/family education;Neuromuscular re-education;Balance training    PT Next Visit Plan  Re-assess for R pBPPV and assess all other canals and treat prn. Perform FGA once vertigo resolved.     Consulted and Agree with Plan of Care  Patient       Patient will benefit from skilled therapeutic intervention in order to improve the following deficits and impairments:  Abnormal gait, Dizziness, Decreased mobility, Decreased balance  Visit Diagnosis: BPPV (benign paroxysmal positional vertigo), bilateral - Plan: PT plan of care cert/re-cert  Dizziness and giddiness - Plan: PT plan of care cert/re-cert  Other abnormalities of gait and mobility - Plan: PT plan of care cert/re-cert     Problem List  Patient Active Problem List   Diagnosis Date Noted  . Bilateral headaches 08/17/2017  . Sinus disease 08/17/2017  . Cephalalgia 08/17/2017  . New persistent daily headache 08/11/2017  . Adrenal mass ([place]) 08/06/2017  . Long term prescription benzodiazepine use 04/30/2017  . Macrocytosis without anemia 02/28/2016  . Encounter for preventive health examination 02/27/2016  . BMI 25.0-25.9,adult 02/27/2016  . BPPV (benign paroxysmal positional vertigo) 08/07/2015  . Anxiety state 05/25/2015  . Prediabetes 11/30/2014  . Vitamin D deficiency 11/30/2014  . Lipoma of back 06/14/2014  . Dyslipidemia 06/13/2014  . History of adenomatous polyp of colon 06/13/2014    Jojuan Champney L 09/09/2017, 11:49 AM  [facility] [address] [place], [place],  [address] Phone: [phone]   Fax:  [phone]  Name: Swayzie Choate MRN: [id number] Date of Birth: [birth year]  Geoffry Paradise, PT,DPT 09/09/17 11:49 AM Phone: [phone] Fax: [phone]

## 2017-09-15 ENCOUNTER — Telehealth: Payer: Self-pay | Admitting: Neurology

## 2017-09-15 NOTE — Telephone Encounter (Signed)
I called the patient.  MRI of the brain was normal, MRA of the head was normal.  I discussed this with the patient.   MRA head 09/15/17:  IMPRESSION:  Normal MRA head (without).   MRI brain 09/15/17:  IMPRESSION:  Normal MRI brain (with and without).

## 2017-09-22 ENCOUNTER — Ambulatory Visit: Payer: Commercial Managed Care - PPO | Admitting: Neurology

## 2017-09-22 ENCOUNTER — Encounter: Payer: Self-pay | Admitting: Endocrinology

## 2017-09-22 ENCOUNTER — Ambulatory Visit: Payer: Commercial Managed Care - PPO | Admitting: Endocrinology

## 2017-09-22 ENCOUNTER — Ambulatory Visit: Payer: Commercial Managed Care - PPO

## 2017-09-22 ENCOUNTER — Encounter: Payer: Self-pay | Admitting: Neurology

## 2017-09-22 VITALS — BP 134/78 | HR 89 | Wt 151.2 lb

## 2017-09-22 VITALS — BP 143/88 | HR 66 | Ht 64.0 in | Wt 151.0 lb

## 2017-09-22 DIAGNOSIS — R2689 Other abnormalities of gait and mobility: Secondary | ICD-10-CM

## 2017-09-22 DIAGNOSIS — E279 Disorder of adrenal gland, unspecified: Secondary | ICD-10-CM

## 2017-09-22 DIAGNOSIS — H8110 Benign paroxysmal vertigo, unspecified ear: Secondary | ICD-10-CM

## 2017-09-22 DIAGNOSIS — E278 Other specified disorders of adrenal gland: Secondary | ICD-10-CM

## 2017-09-22 DIAGNOSIS — H8113 Benign paroxysmal vertigo, bilateral: Secondary | ICD-10-CM | POA: Diagnosis not present

## 2017-09-22 DIAGNOSIS — R42 Dizziness and giddiness: Secondary | ICD-10-CM

## 2017-09-22 MED ORDER — DEXAMETHASONE 1 MG PO TABS: ORAL_TABLET | ORAL | 0 refills | Status: DC

## 2017-09-22 NOTE — Therapy (Signed)
[facility] [address] [place], [place], [address] Phone: [phone]   Fax:  [phone]  Physical Therapy Treatment  Patient Details  Name: Mikayla Tucker MRN: [id number] Date of Birth: 1960-11-06 Referring Provider: Dr. Brett Fairy   Encounter Date: 09/22/2017  PT End of Session - 09/22/17 1440    Visit Number  2    Number of Visits  5    Date for PT Re-Evaluation  10/09/17    Authorization Type  [employer]-60 visit combined limit    Authorization - Visit Number  2    Authorization - Number of Visits  60    PT Start Time  0848    PT Stop Time  0931    PT Time Calculation (min)  43 min    Equipment Utilized During Treatment  -- min guard to S prn    Activity Tolerance  Patient tolerated treatment well    Behavior During Therapy  [facility] for tasks assessed/performed       Past Medical History:  Diagnosis Date  . Allergy   . Hyperlipidemia   . Squamous cell skin cancer    SCC    Past Surgical History:  Procedure Laterality Date  . APPENDECTOMY    . BACK SURGERY    . BREAST SURGERY     biopsy  . CHOLECYSTECTOMY    . COLONOSCOPY    . LIPOMA EXCISION     under breast on R side    There were no vitals filed for this visit.  Subjective Assessment - 09/22/17 0850    Subjective  Pt reports "vertigo is cured". Pt reports some wooziness at times but no spinning. Pt denied falls.     Pertinent History  Allergies, HLD, hx of prediabetes, hx of colon polyp    Patient Stated Goals  No dizziness, prior to starting job in March.     Currently in Pain?  No/denies         [facility] PT Assessment - 09/22/17 0851      Functional Gait  Assessment   Gait assessed   Yes    Gait Level Surface  Walks 20 ft in less than 5.5 sec, no assistive devices, good speed, no evidence for imbalance, normal gait pattern, deviates no more than 6 in outside of the 12 in walkway width. 4.7 sec.    Change in Gait Speed  Able to smoothly change  walking speed without loss of balance or gait deviation. Deviate no more than 6 in outside of the 12 in walkway width.    Gait with Horizontal Head Turns  Performs head turns smoothly with no change in gait. Deviates no more than 6 in outside 12 in walkway width    Gait with Vertical Head Turns  Performs head turns with no change in gait. Deviates no more than 6 in outside 12 in walkway width.    Gait and Pivot Turn  Pivot turns safely within 3 sec and stops quickly with no loss of balance.    Step Over Obstacle  Is able to step over 2 stacked shoe boxes taped together (9 in total height) without changing gait speed. No evidence of imbalance.    Gait with Narrow Base of Support  Is able to ambulate for 10 steps heel to toe with no staggering.    Gait with Eyes Closed  Walks 20 ft, no assistive devices, good speed, no evidence of imbalance, normal gait pattern, deviates no more than 6 in outside  12 in walkway width. Ambulates 20 ft in less than 7 sec.    Ambulating Backwards  Walks 20 ft, no assistive devices, good speed, no evidence for imbalance, normal gait    Steps  Alternating feet, no rail.    Total Score  30    FGA comment:  Score is WNL.         Vestibular Assessment - 09/22/17 0859      Positional Testing   Dix-Hallpike  Dix-Hallpike Right;Dix-Hallpike Left    Horizontal Canal Testing  Horizontal Canal Right;Horizontal Canal Left      Dix-Hallpike Right   Dix-Hallpike Right Duration  < 30sec. with 2/10 dizziness. Decr. amplitude    Dix-Hallpike Right Symptoms  Upbeat, right rotatory nystagmus      Dix-Hallpike Left   Dix-Hallpike Left Duration  <15 sec. and 3/10 dizziness.     Dix-Hallpike Left Symptoms  Upbeat, left rotatory nystagmus      Horizontal Canal Right   Horizontal Canal Right Duration  none    Horizontal Canal Right Symptoms  Normal      Horizontal Canal Left   Horizontal Canal Left Duration  none    Horizontal Canal Left Symptoms  Normal                Vestibular Treatment/Exercise - 09/22/17 0904      Vestibular Treatment/Exercise   Vestibular Treatment Provided  Canalith Repositioning    Canalith Repositioning  Epley Manuever Left    Habituation Exercises  Brandt Daroff       EPLEY MANUEVER LEFT   Number of Reps   2    Overall Response   Improved Symptoms     RESPONSE DETAILS LEFT  Pt reported wooziness 2/10 after 1st treatment rep. Pt reported 1/10 dizziness after second treatment.       Nestor Lewandowsky   Number of Reps   3    Symptom Description   Pt denied dizziness.Please see pt instructions for HEP details.             PT Education - 09/22/17 1439    Education provided  Yes    Education Details  PT educated pt on B pBPPV and treatment. PT also provided pt with habituation HEP to reduce dizziness. PT also explained FGA results.     Person(s) Educated  Patient    Methods  Explanation;Demonstration;Verbal cues;Handout    Comprehension  Returned demonstration;Verbalized understanding       PT Short Term Goals - 09/09/17 1143      PT SHORT TERM GOAL #1   Title  same as LTGs        PT Long Term Goals - 09/22/17 1444      PT LONG TERM GOAL #1   Title  Pt will be IND in HEP to improve dizziness and balance. TARGET DATE FOR ALL LTGS: 10/07/17    Status  New      PT LONG TERM GOAL #2   Title  PT will perform FGA and write goal as indicated.     Baseline  30/30 on 09/22/17    Status  Deferred      PT LONG TERM GOAL #3   Title  Pt will report 0/10 dizziness during all positional testing.     Status  New      PT LONG TERM GOAL #4   Title  Write amb. goal as indicated once vertigo resolved.     Status  New  Plan - 09/22/17 1441    Clinical Impression Statement  Pt demonstrated progress, as s/s of BPPV are reduced but she did experience nystagmus and dizziness (2-3/10) during R and L Dix-Hallpike testing. This indicates pt has B pBPPV. PT treated L side today, as pt reported  incr. dizziness on L side vs. R side. PT also provided pt with habituation to decr. dizziness. Pt's FGA indicates pt is not at risk for falls. Pt would continue to benefit from skilled PT to decr. dizziness during ADLs.     Rehab Potential  Good    Clinical Impairments Affecting Rehab Potential  see above    PT Frequency  1x / week    PT Duration  4 weeks    PT Treatment/Interventions  ADLs/Self Care Home Management;Biofeedback;Canalith Repostioning;Therapeutic activities;Therapeutic exercise;Manual techniques;Vestibular;Functional mobility training;Stair training;Gait training;DME Instruction;Patient/family education;Neuromuscular re-education;Balance training    PT Next Visit Plan  Re-assess for B pBPPV treat prn. Review habituation HEP prn. Check goals and potentially d/c if BPPV resolved.     Consulted and Agree with Plan of Care  Patient       Patient will benefit from skilled therapeutic intervention in order to improve the following deficits and impairments:  Abnormal gait, Dizziness, Decreased mobility, Decreased balance  Visit Diagnosis: BPPV (benign paroxysmal positional vertigo), bilateral  Dizziness and giddiness  Other abnormalities of gait and mobility     Problem List Patient Active Problem List   Diagnosis Date Noted  . Bilateral headaches 08/17/2017  . Sinus disease 08/17/2017  . Cephalalgia 08/17/2017  . New persistent daily headache 08/11/2017  . Adrenal mass ([place]) 08/06/2017  . Long term prescription benzodiazepine use 04/30/2017  . Macrocytosis without anemia 02/28/2016  . Encounter for preventive health examination 02/27/2016  . BMI 25.0-25.9,adult 02/27/2016  . BPPV (benign paroxysmal positional vertigo) 08/07/2015  . Anxiety state 05/25/2015  . Prediabetes 11/30/2014  . Vitamin D deficiency 11/30/2014  . Lipoma of back 06/14/2014  . Dyslipidemia 06/13/2014  . History of adenomatous polyp of colon 06/13/2014    Mychal Durio L 09/22/2017, 2:45  PM  [facility] [address] [place], [place], [address] Phone: [phone]   Fax:  [phone]  Name: Mikayla Tucker MRN: [id number] Date of Birth: December 19, 1960  Geoffry Paradise, PT,DPT 09/22/17 2:52 PM Phone: [phone] Fax: [phone]

## 2017-09-22 NOTE — Patient Instructions (Addendum)
You should do a "dexamethasone suppression test."  For this, you would take dexamethasone 1 mg at 10 pm (I have sent a prescription to your pharmacy), then come in for a "cortisol" blood test the next morning before 9 am.  You do not need to be fasting for this test.  Also, please do a 24-HR urine test.  We'll let you know about the results.   Please redo the CT scan in a few months.  you will receive a phone call, about a day and time for an appointment. I would be happy to see you back here as needed.

## 2017-09-22 NOTE — Progress Notes (Signed)
SLEEP MEDICINE CLINIC   Provider:  Larey Seat, [place] D  Primary Care Physician:  Ma Hillock, DO   Referring Provider: Ma Hillock, DO    Chief Complaint  Patient presents with  . New Patient (Initial Visit)    pt alone, rm 10. pt states about 6 wks she has been having headaches and dizziness. the headaches have somewhat subsided but the dizziness is still present.    HPI:  Mikayla Tucker is a 56 y.o. female , seen here as in a referral  from Dr. Raoul Pitch for headaches associated with dizziness.   Chief complaint according to patient : 6 weeks ago the patient suffered an acute onset of dizziness that seem to have vertiginous character.  She felt moving when she was not in motion, she was mildly nauseated '" squeezy".  Certain movements of the head provoked this vertigo that lasted longer than the original movement.  The sensation would go and come as it pleased.  2 times she suffered a spell that she best described as a blackout.  She felt as if her brain switched off and after a short period of seconds rebooted itself. She noted ear aches and the feeling of fluid in the ear- right side. Takes allergy preventive meds every day.  She does not recall if her vision changed, if there was another sensation of lack of awareness, or loss of consciousness. Over the last 2 weeks or so she has not had 1 of these rebooting spells, and she has had less headaches but again more dizziness. The headaches are described throbbing, not burning, pulsating sensation- lasting minutes to an hour. 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  The headaches come and go but she has daily bouts of dizziness as well.,  She is not sure that headache and dizziness are related.  However they both started about 6 weeks ago.  She had wondered if the alcohol based and acrylic paint that she uses could have caused some of  the headaches.  During the same timeframe she also developed elevated liver enzymes, she sees Dr. Gatha Mayer gastroenterologist about the elevated liver enzymes and the feeling of abdominal distention.  She was diagnosed with a fatty liver, microcytic anemia, an MRI of the abdomen of August 08, 2017 was interpreted as showing a right adrenal adenoma 2.7 cm hypoechoic 2.5 by 2.6 by 2.1 centimeter mass between the liver and right kidney.  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   Medical history: The patient underwent a discectomy with fusion and lumbar cage L4-5 in 2001.  Cholecystectomy over 10 years ago.  Microcytosis,  elevated LFT and adrenal mass.   Social history: married, 1 child ( daughter 22 at [facility] [employer])  7 alcohol drinks/ week. Stopped 10 days ago. Quit smoking 1985, caffeine -2 cups of coffee a day, no energy drinks or sodas.  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   09-22-2017, Mikayla Tucker has undergone vestibular rehab at [facility] next door felt immediately with her first session because of vertigo was addressed, and that she has been 99% better since.  She also developed a headache right after the session and now do the conclusion that her  headaches were related to vertigo episodes.  She has not had vertigo episodes since the treatment and is expected to have her second treatment today at 830.  The MRI of the brain was performed on 4 December and the results were called by Dr. Jannifer Franklin, they were absolutely normal, there was no angiographic evidence of any vascular abnormality especially bilateral vertebral, basilar and posterior cerebral arteries were seen normal flow void no evidence of stenosis.   Review of Systems: Out of a complete 14 system review, the patient complains of only the following symptoms, and all other  reviewed systems are negative.  no longer having vertigo.     Social History   Socioeconomic History  . Marital status: Married    Spouse name: Not on file  . Number of children: 1  . Years of education: Not on file  . Highest education level: Not on file  Social Needs  . Financial resource strain: Not on file  . Food insecurity - worry: Not on file  . Food insecurity - inability: Not on file  . Transportation needs - medical: Not on file  . Transportation needs - non-medical: Not on file  Occupational History    Employer: [employer]    Comment: homemaker  Tobacco Use  . Smoking status: Former Smoker    Packs/day: 1.00    Years: 10.00    Pack years: 10.00  . Smokeless tobacco: Never Used  . Tobacco comment: quit 56 yrs old  Substance and Sexual Activity  . Alcohol use: Yes    Alcohol/week: 4.2 oz    Types: 7 Glasses of wine per week  . Drug use: No  . Sexual activity: Yes    Birth control/protection: Post-menopausal  Other Topics Concern  . Not on file  Social History Narrative   Mikayla Tucker lives with her husband and daughter. She is originally from [place]-moved to [place]. She relocated to [facility] [place] [facility] about [place] from [place].    Family History  Adopted: Yes  Problem Relation Age of Onset  . Colon cancer Neg Hx     Past Medical History:  Diagnosis Date  . Allergy   . Hyperlipidemia   . Squamous cell skin cancer    SCC    Past Surgical History:  Procedure Laterality Date  . APPENDECTOMY    . BACK SURGERY    . BREAST SURGERY     biopsy  . CHOLECYSTECTOMY    . COLONOSCOPY    . LIPOMA EXCISION     under breast on R side    Current Outpatient Medications  Medication Sig Dispense Refill  . atorvastatin (LIPITOR) 80 MG tablet Take 1 tablet (80 mg total) by mouth daily. 90 tablet 3  . b complex vitamins capsule Take 1 capsule by mouth daily.    . Cholecalciferol 1000 units tablet Take 3,000 Units by mouth daily.     . fexofenadine (ALLEGRA) 180 MG tablet  Take 180 mg by mouth daily.    . fluticasone (FLONASE) 50 MCG/ACT nasal spray Place 2 sprays into both nostrils daily. 16 g 3  . LORazepam (ATIVAN) 1 MG tablet Take 1 tablet (1 mg total) by mouth 2 (two) times daily as needed for anxiety. 60 tablet 5  . traZODone (DESYREL) 50 MG tablet Take 1 tablet (50 mg total) by mouth at bedtime as needed for sleep. 30 tablet 0   No current facility-administered medications for this visit.     Allergies as of 08/17/2017 -  Review Complete 08/17/2017  Allergen Reaction Noted  . Hydrocodone-acetaminophen Hives 12/20/2014    Vitals: Ht 5\' 4"  (1.626 m)   Wt 157 lb (71.2 kg)   BMI 26.95 kg/m  Last Weight:  Wt Readings from Last 1 Encounters:  08/17/17 157 lb (71.2 kg)   XHB:ZJIR mass index is 26.95 kg/m.     Last Height:   Ht Readings from Last 1 Encounters:  08/17/17 5\' 4"  (1.626 m)    Physical exam:  General: The patient is awake, alert and appears not in acute distress. The patient is well groomed. Head: Normocephalic, atraumatic. Neck is supple. Mallampati 3-4,  neck circumference:15. 75. Nasal airflow patent , Cardiovascular:  Regular rate and rhythm, without  murmurs or carotid bruit, and without distended neck veins. Respiratory: Lungs are clear to auscultation.Skin:  Without evidence of edema, or rash. Trunk: BMI is 27.  Neurologic exam : The patient is awake and alert, oriented to place and time.  Attention span & concentration ability appears normal. Speech is fluent, without dysarthria, dysphonia or aphasia. Mood and affect are appropriate.   Assessment:  After physical and neurologic examination, review of laboratory studies,  Personal review of imaging studies, reports of other /same  Imaging studies, results of polysomnography and / or neurophysiology testing and pre-existing records as far as provided in visit., my assessment is   1)BPV - cured by vestibular rehab. Normal MRI and MRA brain.   I spent more than 15 minutes of face  to face time with the patient.  Greater than 50% of time was spent in counseling and coordination of care. We have discussed the diagnosis and differential and I answered the patient's questions.       Larey Seat, MD , 67:89 AM  Certified in Neurology by ABPN Certified in Sleep Medicine by [facility] [address], [place], [place] [address]

## 2017-09-22 NOTE — Patient Instructions (Signed)
Sit to Side-Lying    Sit on edge of bed. 1. Turn head 45 to right. 2. Maintain head position and lie down slowly on left side. Hold until symptoms subside plus 30 seconds. 3. Sit up slowly. Hold until symptoms subside plus 30 seconds. 4. Turn head 45 to left. 5. Maintain head position and lie down slowly on right side. Hold until symptoms subside plus 30 seconds.. 6. Sit up slowly. Repeat sequence __3-5__ times per session. Do __1__ sessions per day.  Copyright  VHI. All rights reserved.

## 2017-09-22 NOTE — Patient Instructions (Signed)
Vertigo Vertigo is the feeling that you or your surroundings are moving when they are not. Vertigo can be dangerous if it occurs while you are doing something that could endanger you or others, such as driving. What are the causes? This condition is caused by a disturbance in the signals that are sent by your body's sensory systems to your brain. Different causes of a disturbance can lead to vertigo, including:  Infections, especially in the inner ear.  A bad reaction to a drug, or misuse of alcohol and medicines.  Withdrawal from drugs or alcohol.  Quickly changing positions, as when lying down or rolling over in bed.  Migraine headaches.  Decreased blood flow to the brain.  Decreased blood pressure.  Increased pressure in the brain from a head or neck injury, stroke, infection, tumor, or bleeding.  Central nervous system disorders.  What are the signs or symptoms? Symptoms of this condition usually occur when you move your head or your eyes in different directions. Symptoms may start suddenly, and they usually last for less than a minute. Symptoms may include:  Loss of balance and falling.  Feeling like you are spinning or moving.  Feeling like your surroundings are spinning or moving.  Nausea and vomiting.  Blurred vision or double vision.  Difficulty hearing.  Slurred speech.  Dizziness.  Involuntary eye movement (nystagmus).  Symptoms can be mild and cause only slight annoyance, or they can be severe and interfere with daily life. Episodes of vertigo may return (recur) over time, and they are often triggered by certain movements. Symptoms may improve over time. How is this diagnosed? This condition may be diagnosed based on medical history and the quality of your nystagmus. Your health care provider may test your eye movements by asking you to quickly change positions to trigger the nystagmus. This may be called the Dix-Hallpike test, head thrust test, or roll test.  You may be referred to a health care provider who specializes in ear, nose, and throat (ENT) problems (otolaryngologist) or a provider who specializes in disorders of the central nervous system (neurologist). You may have additional testing, including:  A physical exam.  Blood tests.  MRI.  A CT scan.  An electrocardiogram (ECG). This records electrical activity in your heart.  An electroencephalogram (EEG). This records electrical activity in your brain.  Hearing tests.  How is this treated? Treatment for this condition depends on the cause and the severity of the symptoms. Treatment options include:  Medicines to treat nausea or vertigo. These are usually used for severe cases. Some medicines that are used to treat other conditions may also reduce or eliminate vertigo symptoms. These include: ? Medicines that control allergies (antihistamines). ? Medicines that control seizures (anticonvulsants). ? Medicines that relieve depression (antidepressants). ? Medicines that relieve anxiety (sedatives).  Head movements to adjust your inner ear back to normal. If your vertigo is caused by an ear problem, your health care provider may recommend certain movements to correct the problem.  Surgery. This is rare.  Follow these instructions at home: Safety  Move slowly.Avoid sudden body or head movements.  Avoid driving.  Avoid operating heavy machinery.  Avoid doing any tasks that would cause danger to you or others if you would have a vertigo episode during the task.  If you have trouble walking or keeping your balance, try using a cane for stability. If you feel dizzy or unstable, sit down right away.  Return to your normal activities as told by your   health care provider. Ask your health care provider what activities are safe for you. General instructions  Take over-the-counter and prescription medicines only as told by your health care provider.  Avoid certain positions or  movements as told by your health care provider.  Drink enough fluid to keep your urine clear or pale yellow.  Keep all follow-up visits as told by your health care provider. This is important. Contact a health care provider if:  Your medicines do not relieve your vertigo or they make it worse.  You have a fever.  Your condition gets worse or you develop new symptoms.  Your family or friends notice any behavioral changes.  Your nausea or vomiting gets worse.  You have numbness or a "pins and needles" sensation in part of your body. Get help right away if:  You have difficulty moving or speaking.  You are always dizzy.  You faint.  You develop severe headaches.  You have weakness in your hands, arms, or legs.  You have changes in your hearing or vision.  You develop a stiff neck.  You develop sensitivity to light. This information is not intended to replace advice given to you by your health care provider. Make sure you discuss any questions you have with your health care provider. Document Released: 07/02/2005 Document Revised: 03/05/2016 Document Reviewed: 01/15/2015 Elsevier Interactive Patient Education  2017 [employer].  

## 2017-09-22 NOTE — Progress Notes (Signed)
Subjective:    Patient ID: Mikayla Tucker, female    DOB: 07/05/1961, 56 y.o.   MRN: [id number]  HPI Pt is ref for adrenal nodule. In early 11/18, pt had abd [place], to eval liver enzymes and macrocytosis, and right adrenal nodule was incidentally noted.  she reports of moderate dizziness sensation in the head, and assoc headache.  she has no h/o HTN.  she has no h/o adrenal disease, diabetes, MTC, alcohol withdrawal, neurofibromas, hemangiomas, brain aneurysm, thyroid disease.   Past Medical History:  Diagnosis Date  . Allergy   . Hyperlipidemia   . Squamous cell skin cancer    SCC    Past Surgical History:  Procedure Laterality Date  . APPENDECTOMY    . BACK SURGERY    . BREAST SURGERY     biopsy  . CHOLECYSTECTOMY    . COLONOSCOPY    . LIPOMA EXCISION     under breast on R side    Social History   Socioeconomic History  . Marital status: Married    Spouse name: Not on file  . Number of children: 1  . Years of education: Not on file  . Highest education level: Not on file  Social Needs  . Financial resource strain: Not on file  . Food insecurity - worry: Not on file  . Food insecurity - inability: Not on file  . Transportation needs - medical: Not on file  . Transportation needs - non-medical: Not on file  Occupational History    Employer: [employer]    Comment: homemaker  Tobacco Use  . Smoking status: Former Smoker    Packs/day: 1.00    Years: 10.00    Pack years: 10.00  . Smokeless tobacco: Never Used  . Tobacco comment: quit 56 yrs old  Substance and Sexual Activity  . Alcohol use: Yes    Alcohol/week: 4.2 oz    Types: 7 Glasses of wine per week  . Drug use: No  . Sexual activity: Yes    Birth control/protection: Post-menopausal  Other Topics Concern  . Not on file  Social History Narrative   Ms. Sayer lives with her husband and daughter. She is originally from [place]-moved to [place]. She relocated to [facility] about [place] from [place].    Current  Outpatient Medications on File Prior to Visit  Medication Sig Dispense Refill  . amitriptyline (ELAVIL) 10 MG tablet Take 0.5 tablets (5 mg total) by mouth at bedtime. 30 tablet 3  . atorvastatin (LIPITOR) 80 MG tablet Take 1 tablet (80 mg total) by mouth daily. 90 tablet 3  . b complex vitamins capsule Take 1 capsule by mouth daily.    . Cholecalciferol 1000 units tablet Take 3,000 Units by mouth daily.     . fexofenadine (ALLEGRA) 180 MG tablet Take 180 mg by mouth daily.    . fluticasone (FLONASE) 50 MCG/ACT nasal spray Place 2 sprays into both nostrils daily. 16 g 3  . LORazepam (ATIVAN) 1 MG tablet Take 1 tablet (1 mg total) by mouth 2 (two) times daily as needed for anxiety. 60 tablet 5  . Magnesium 250 MG TABS Take 1 tablet by mouth.     Current Facility-Administered Medications on File Prior to Visit  Medication Dose Route Frequency Provider Last Rate Last Dose  . gadopentetate dimeglumine (MAGNEVIST) injection 14 mL  14 mL Intravenous Once PRN Dohmeier, Asencion Partridge, MD        Allergies  Allergen Reactions  . Hydrocodone-Acetaminophen  Hives    Family History  Adopted: Yes  Problem Relation Age of Onset  . Colon cancer Neg Hx     BP 134/78 (BP Location: Left Arm, Patient Position: Sitting, Cuff Size: Normal)   Pulse 89   Wt 151 lb 3.2 oz (68.6 kg)   SpO2 97%   BMI 25.95 kg/m    Review of Systems denies pallor, n/v, syncope, diarrhea, weight loss, chest pain, sob, anxiety, visual loss, palpitations, fever, arthralgias, rhinorrhea, easy bruising, and excessive diaphoresis.  She has intermitt flushing.     Objective:   Physical Exam VS: see vs page GEN: no distress HEAD: head: no deformity eyes: no periorbital swelling, no proptosis external nose and ears are normal mouth: no lesion seen NECK: supple, thyroid is not enlarged CHEST WALL: no deformity LUNGS: clear to auscultation CV: reg rate and rhythm, no murmur ABD: abdomen is soft, nontender.  no hepatosplenomegaly.   not distended.  no hernia MUSCULOSKELETAL: muscle bulk and strength are grossly normal.  no obvious joint swelling.  gait is normal and steady EXTEMITIES: no deformity.  no ulcer on the feet.  feet are of normal color and temp.  no edema PULSES: dorsalis pedis intact bilat.  no carotid bruit NEURO:  cn 2-12 grossly intact.   readily moves all 4's.  sensation is intact to touch on the feet SKIN:  Normal texture and temperature.  No rash or suspicious lesion is visible.   NODES:  None palpable at the neck PSYCH: alert, well-oriented.  Does not appear anxious nor depressed.   Lab Results  Component Value Date   HGBA1C 5.4 04/29/2017   I have reviewed outside records, and summarized:  Pt was noted to have adrenal nodule, and referred here.  She was seen for dizziness, and vertigo was dx'ed.   MRI: 1. 2.7 cm right adrenal adenoma. 2. Slight indentation along the right posterior liver margin due to a muscular strut of the hemidiaphragm. 3. Diffuse hepatic steatosis. 4. 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 Postoperative findings in the lumbar spine.     Assessment & Plan:  Adrenal nodule, new, uncertain etiology.   Patient Instructions  You should do a "dexamethasone suppression test."  For this, you would take dexamethasone 1 mg at 10 pm (I have sent a prescription to your pharmacy), then come in for a "cortisol" blood test the next morning before 9 am.  You do not need to be fasting for this test.  Also, please do a 24-HR urine test.  We'll let you know about the results.   Please redo the CT scan in a few months.  you will receive a phone call, about a day and time for an appointment. I would be happy to see you back here as needed.

## 2017-10-13 ENCOUNTER — Ambulatory Visit: Payer: Commercial Managed Care - PPO | Attending: Neurology

## 2017-10-13 ENCOUNTER — Telehealth: Payer: Self-pay | Admitting: *Deleted

## 2017-10-13 DIAGNOSIS — H8113 Benign paroxysmal vertigo, bilateral: Secondary | ICD-10-CM

## 2017-10-13 DIAGNOSIS — R42 Dizziness and giddiness: Secondary | ICD-10-CM | POA: Diagnosis present

## 2017-10-13 NOTE — Patient Instructions (Signed)
Sit to Side-Lying    Sit on edge of bed. 1. Turn head 45 to right. 2. Maintain head position and lie down slowly on left side. Hold until symptoms subside plus 30 seconds. 3. Sit up slowly. Hold until symptoms subside plus 30 seconds. 4. Turn head 45 to left. 5. Maintain head position and lie down slowly on right side. Hold until symptoms subside plus 30 seconds.. 6. Sit up slowly. Repeat sequence __3-5__ times per session. Do __1__ sessions per day.  Copyright  VHI. All rights reserved.

## 2017-10-13 NOTE — Telephone Encounter (Signed)
Copied from [place] [id number]. Topic: Inquiry >> Oct 13, 2017 11:36 AM Oliver Pila B wrote: Reason for CRM: pt called to see if she could have patches for sea sickness called in for her or if she needs to come in for an appt to get them, contact pt to advise  Patient last seen 09/01/17 for sinus infection. Please advise.

## 2017-10-13 NOTE — Telephone Encounter (Signed)
Spoke with patient reviewed information she will call back to schedule her appt.

## 2017-10-13 NOTE — Therapy (Signed)
[facility] [address] [place], [place], [address] Phone: [phone]   Fax:  [phone]  Physical Therapy Treatment  Patient Details  Name: Mikayla Tucker MRN: [id number] Date of Birth: 01-Sep-1961 Referring Provider: Dr. Brett Fairy   Encounter Date: 10/13/2017  PT End of Session - 10/13/17 1010    Visit Number  3    Number of Visits  8    Date for PT Re-Evaluation  11/08/17    Authorization Type  [employer]-60 visit combined limit    Authorization - Visit Number  3    Authorization - Number of Visits  60    PT Start Time  0933    PT Stop Time  1013    PT Time Calculation (min)  40 min    Activity Tolerance  Patient tolerated treatment well    Behavior During Therapy  [facility] for tasks assessed/performed       Past Medical History:  Diagnosis Date  . Allergy   . Hyperlipidemia   . Squamous cell skin cancer    SCC    Past Surgical History:  Procedure Laterality Date  . APPENDECTOMY    . BACK SURGERY    . BREAST SURGERY     biopsy  . CHOLECYSTECTOMY    . COLONOSCOPY    . LIPOMA EXCISION     under breast on R side    There were no vitals filed for this visit.  Subjective Assessment - 10/13/17 0936    Subjective  Pt reported she felt great but over the last 2 days she has had a little dizziness. Pt misplaced her HEP and needs a new copy.     Pertinent History  Allergies, HLD, hx of prediabetes, hx of colon polyp    Patient Stated Goals  No dizziness, prior to starting job in March.     Currently in Pain?  No/denies             Vestibular Assessment - 10/13/17 0939      Positional Testing   Dix-Hallpike  Dix-Hallpike Right;Dix-Hallpike Left      Dix-Hallpike Right   Dix-Hallpike Right Duration  5/10 with 45 sec. of dizziness.    Dix-Hallpike Right Symptoms  Upbeat, right rotatory nystagmus               Vestibular Treatment/Exercise - 10/13/17 0946      Vestibular Treatment/Exercise   Vestibular Treatment Provided  Canalith Repositioning    Canalith Repositioning  Epley Manuever Right    Habituation Exercises  Nestor Lewandowsky       EPLEY MANUEVER RIGHT   Number of Reps   2    Overall Response  Improved Symptoms    Response Details   Pt reported 1-2/10 dizziness after treatment and nystagmus amplitude was less.       EPLEY MANUEVER LEFT   Number of Reps   2    Overall Response   Improved Symptoms     RESPONSE DETAILS LEFT  Dizziness decr. to 0.5-1/10 after treatment.       Nestor Lewandowsky   Number of Reps   3    Symptom Description   Pt denied dizziness and no nystagmus noted but pt reported wooziness upon return to sitting. Cues and demo for technique.             PT Education - 10/13/17 1009    Education provided  Yes    Education Details  PT provided pt with habituation HEP  again, since pt misplaced her copy. PT added additional appt's, as vertigo still present.     Person(s) Educated  Patient    Methods  Explanation;Demonstration;Verbal cues;Handout    Comprehension  Returned demonstration;Verbalized understanding       PT Short Term Goals - 09/09/17 1143      PT SHORT TERM GOAL #1   Title  same as LTGs        PT Long Term Goals - 10/13/17 1012      PT LONG TERM GOAL #1   Title  Pt will be IND in HEP to improve dizziness and balance. TARGET DATE FOR ALL LTGS: 10/07/17 (all unmet goals will be carried over to new POC: 11/06/17)    Status  Not Met      PT LONG TERM GOAL #2   Title  PT will perform FGA and write goal as indicated.     Baseline  30/30 on 09/22/17    Status  Deferred      PT LONG TERM GOAL #3   Title  Pt will report 0/10 dizziness during all positional testing.     Status  Not Met      PT LONG TERM GOAL #4   Title  Write amb. goal as indicated once vertigo resolved.     Status  Deferred            Plan - 10/13/17 1011    Clinical Impression Statement  Pt continues to experience dizziness and nystagmus during B  Dix-Hallpike, consistent with p BPPV in B canals. Pt reported improved sx's after treatment and habituation. PT requesting additional 1x/week for 4 weeks to improve safety during functional mobility.     Rehab Potential  Good    Clinical Impairments Affecting Rehab Potential  see above    PT Frequency  1x / week    PT Duration  4 weeks    PT Treatment/Interventions  ADLs/Self Care Home Management;Biofeedback;Canalith Repostioning;Therapeutic activities;Therapeutic exercise;Manual techniques;Vestibular;Functional mobility training;Stair training;Gait training;DME Instruction;Patient/family education;Neuromuscular re-education;Balance training    PT Next Visit Plan  Re-assess for B pBPPV treat prn. Review habituation HEP prn. Check goals and potentially d/c if BPPV resolved.     Consulted and Agree with Plan of Care  Patient       Patient will benefit from skilled therapeutic intervention in order to improve the following deficits and impairments:  Abnormal gait, Dizziness, Decreased mobility, Decreased balance  Visit Diagnosis: BPPV (benign paroxysmal positional vertigo), bilateral - Plan: PT plan of care cert/re-cert  Dizziness and giddiness - Plan: PT plan of care cert/re-cert     Problem List Patient Active Problem List   Diagnosis Date Noted  . Bilateral headaches 08/17/2017  . Sinus disease 08/17/2017  . Cephalalgia 08/17/2017  . New persistent daily headache 08/11/2017  . Adrenal mass ([place]) 08/06/2017  . Long term prescription benzodiazepine use 04/30/2017  . Macrocytosis without anemia 02/28/2016  . Encounter for preventive health examination 02/27/2016  . BMI 25.0-25.9,adult 02/27/2016  . BPPV (benign paroxysmal positional vertigo) 08/07/2015  . Anxiety state 05/25/2015  . Prediabetes 11/30/2014  . Vitamin D deficiency 11/30/2014  . Lipoma of back 06/14/2014  . Dyslipidemia 06/13/2014  . History of adenomatous polyp of colon 06/13/2014    Jacqeline Broers L 10/13/2017,  12:13 PM  [facility] [address] [place], [place], [address] Phone: [phone]   Fax:  [phone]  Name: Mikayla Tucker MRN: [id number] Date of Birth: [birth year]  Geoffry Paradise, PT,DPT 10/13/17  12:13 PM Phone: [phone] Fax: [phone]

## 2017-10-13 NOTE — Telephone Encounter (Signed)
Pt needs to be seen and counseled on use for scopolamine patches, which is what I believe she is referring. Especially since dizziness is a common side effect (and she has had issues with dizziness). Patches are not always covered by insurance for this purpose as either, so she may want to check ahead of time with insurance.

## 2017-10-15 LAB — HM MAMMOGRAPHY

## 2017-10-15 LAB — HM PAP SMEAR: HM Pap smear: NORMAL

## 2017-10-19 ENCOUNTER — Other Ambulatory Visit: Payer: Self-pay | Admitting: Neurology

## 2017-10-19 MED ORDER — AMITRIPTYLINE HCL 10 MG PO TABS: 5.0000 mg | ORAL_TABLET | Freq: Every day | ORAL | 2 refills | Status: DC

## 2017-10-20 ENCOUNTER — Ambulatory Visit: Payer: Commercial Managed Care - PPO

## 2017-10-20 DIAGNOSIS — H8113 Benign paroxysmal vertigo, bilateral: Secondary | ICD-10-CM

## 2017-10-20 DIAGNOSIS — R42 Dizziness and giddiness: Secondary | ICD-10-CM

## 2017-10-20 NOTE — Therapy (Signed)
[facility] [address] [place], [place], [address] Phone: [phone]   Fax:  [phone]  Physical Therapy Treatment  Patient Details  Name: Mikayla Tucker MRN: [id number] Date of Birth: 13-Feb-1961 Referring Provider: Dr. Brett Fairy   Encounter Date: 10/20/2017  PT End of Session - 10/20/17 1012    Visit Number  4    Number of Visits  8    Date for PT Re-Evaluation  11/08/17    Authorization Type  [employer]HC-60 visit combined limit    Authorization - Visit Number  4    Authorization - Number of Visits  60    PT Start Time  0931    PT Stop Time  0959    PT Time Calculation (min)  28 min    Activity Tolerance  Patient tolerated treatment well    Behavior During Therapy  [facility] for tasks assessed/performed       Past Medical History:  Diagnosis Date  . Allergy   . Hyperlipidemia   . Squamous cell skin cancer    SCC    Past Surgical History:  Procedure Laterality Date  . APPENDECTOMY    . BACK SURGERY    . BREAST SURGERY     biopsy  . CHOLECYSTECTOMY    . COLONOSCOPY    . LIPOMA EXCISION     under breast on R side    There were no vitals filed for this visit.  Subjective Assessment - 10/20/17 0935    Subjective  Pt denied changes since last visit. Pt reported dizziness seems to be better, 2/10 dizziness during exercises.     Patient is accompained by:  -- MD resident    Pertinent History  Allergies, HLD, hx of prediabetes, hx of colon polyp    Patient Stated Goals  No dizziness, prior to starting job in March.     Currently in Pain?  No/denies             Vestibular Assessment - 10/20/17 0937      Positional Testing   Dix-Hallpike  Dix-Hallpike Right;Dix-Hallpike Left      Dix-Hallpike Right   Dix-Hallpike Right Duration  1-2/10 dizziness with 5 second delay of onset,, < 30 sec.    Dix-Hallpike Right Symptoms  Upbeat, right rotatory nystagmus      Dix-Hallpike Left   Dix-Hallpike Left Duration   1/10 dizziness, < 30 sec.    Dix-Hallpike Left Symptoms  Upbeat, left rotatory nystagmus               Vestibular Treatment/Exercise - 10/20/17 1008      Vestibular Treatment/Exercise   Vestibular Treatment Provided  Canalith Repositioning    Habituation Exercises  Brandt Daroff       EPLEY MANUEVER RIGHT   Number of Reps   1    Overall Response  Improved Symptoms    Response Details   Pt denied dizziness after treatment, PT did not small amplitude nystagmus (upbeating and torsional), < 5 sec.       EPLEY MANUEVER LEFT   Number of Reps   1    Overall Response   Improved Symptoms     RESPONSE DETAILS LEFT  No dizziness or nystagmus noted.       Nestor Lewandowsky   Number of Reps   3    Symptom Description   Pt denied dizziness and no nystagmus noted but pt reported wooziness upon return to sitting.  PT Education - 10/20/17 1011    Education provided  Yes    Education Details  PT educated pt to ask MD regarding about potential N/V and dizziness while on cruise. PT educated pt on continuing HEP for 2 weeks, placing pt on hold and then f/u with potential d/c based on progress.     Person(s) Educated  Patient    Methods  Explanation    Comprehension  Verbalized understanding       PT Short Term Goals - 09/09/17 1143      PT SHORT TERM GOAL #1   Title  same as LTGs        PT Long Term Goals - 10/13/17 1012      PT LONG TERM GOAL #1   Title  Pt will be IND in HEP to improve dizziness and balance. TARGET DATE FOR ALL LTGS: 10/07/17 (all unmet goals will be carried over to new POC: 11/06/17)    Status  Not Met      PT LONG TERM GOAL #2   Title  PT will perform FGA and write goal as indicated.     Baseline  30/30 on 09/22/17    Status  Deferred      PT LONG TERM GOAL #3   Title  Pt will report 0/10 dizziness during all positional testing.     Status  Not Met      PT LONG TERM GOAL #4   Title  Write amb. goal as indicated once vertigo resolved.      Status  Deferred            Plan - 10/20/17 1012    Clinical Impression Statement  Pt demostrated progess, as pt's s/s of B pBPPV have decr. and habituation HEP is going well. Pt continues to experience mild dizziness and upbeating/torsional nystagmus during B BPPV testing with improved s/s with Epley treatment. PT placing pt on hold to continue HEP at home and will likely d/c pt next session based on progress.     Rehab Potential  Good    Clinical Impairments Affecting Rehab Potential  see above    PT Frequency  1x / week    PT Duration  4 weeks    PT Treatment/Interventions  ADLs/Self Care Home Management;Biofeedback;Canalith Repostioning;Therapeutic activities;Therapeutic exercise;Manual techniques;Vestibular;Functional mobility training;Stair training;Gait training;DME Instruction;Patient/family education;Neuromuscular re-education;Balance training    PT Next Visit Plan  Re-assess for B pBPPV treat prn. Review habituation HEP prn. Check goals and potentially d/c if BPPV resolved.     Consulted and Agree with Plan of Care  Patient       Patient will benefit from skilled therapeutic intervention in order to improve the following deficits and impairments:  Abnormal gait, Dizziness, Decreased mobility, Decreased balance  Visit Diagnosis: BPPV (benign paroxysmal positional vertigo), bilateral  Dizziness and giddiness     Problem List Patient Active Problem List   Diagnosis Date Noted  . Bilateral headaches 08/17/2017  . Sinus disease 08/17/2017  . Cephalalgia 08/17/2017  . New persistent daily headache 08/11/2017  . Adrenal mass ([place]) 08/06/2017  . Long term prescription benzodiazepine use 04/30/2017  . Macrocytosis without anemia 02/28/2016  . Encounter for preventive health examination 02/27/2016  . BMI 25.0-25.9,adult 02/27/2016  . BPPV (benign paroxysmal positional vertigo) 08/07/2015  . Anxiety state 05/25/2015  . Prediabetes 11/30/2014  . Vitamin D deficiency  11/30/2014  . Lipoma of back 06/14/2014  . Dyslipidemia 06/13/2014  . History of adenomatous polyp of colon 06/13/2014  Tucker,Mikayla L 10/20/2017, 10:15 AM  [facility] [address] [place], [place], [address] Phone: [phone]   Fax:  [phone]  Name: Mikayla Tucker MRN: [id number] Date of Birth: 11-Jun-1961  Mikayla Paradise, PT,DPT 10/20/17 10:16 AM Phone: [phone] Fax: [phone]

## 2017-10-28 ENCOUNTER — Ambulatory Visit: Payer: Commercial Managed Care - PPO | Admitting: Neurology

## 2017-10-28 ENCOUNTER — Encounter: Payer: Commercial Managed Care - PPO | Admitting: Physical Therapy

## 2017-10-29 ENCOUNTER — Ambulatory Visit: Payer: Commercial Managed Care - PPO | Admitting: Internal Medicine

## 2017-11-02 ENCOUNTER — Other Ambulatory Visit: Payer: Commercial Managed Care - PPO

## 2017-11-03 ENCOUNTER — Telehealth: Payer: Self-pay | Admitting: Endocrinology

## 2017-11-03 ENCOUNTER — Ambulatory Visit: Payer: Commercial Managed Care - PPO | Admitting: Family Medicine

## 2017-11-03 ENCOUNTER — Encounter: Payer: Self-pay | Admitting: Family Medicine

## 2017-11-03 ENCOUNTER — Ambulatory Visit: Payer: Commercial Managed Care - PPO

## 2017-11-03 VITALS — BP 133/89 | HR 70 | Temp 97.9°F | Resp 20 | Wt 151.0 lb

## 2017-11-03 DIAGNOSIS — Z7184 Encounter for health counseling related to travel: Secondary | ICD-10-CM

## 2017-11-03 DIAGNOSIS — R42 Dizziness and giddiness: Secondary | ICD-10-CM

## 2017-11-03 DIAGNOSIS — Z7189 Other specified counseling: Secondary | ICD-10-CM

## 2017-11-03 DIAGNOSIS — H8113 Benign paroxysmal vertigo, bilateral: Secondary | ICD-10-CM | POA: Diagnosis not present

## 2017-11-03 MED ORDER — SCOPOLAMINE 1 MG/3DAYS TD PT72: 1.0000 | MEDICATED_PATCH | TRANSDERMAL | 0 refills | Status: DC

## 2017-11-03 NOTE — Telephone Encounter (Signed)
Patient stated that Dr Loanne Drilling told her to go to her PCP to pick up a 24 hr urine jug, her PCP do not know how to explain to her what to do, so she will come her to to pick up her 24 hr urine jug so penny can explain to her what to do.

## 2017-11-03 NOTE — Therapy (Signed)
[facility] [address] [place], [place], [address] Phone: [phone]   Fax:  [phone]  Physical Therapy Treatment  Patient Details  Name: Mikayla Tucker MRN: [id number] Date of Birth: Mar 18, 1961 Referring Provider: Dr. Brett Fairy   Encounter Date: 11/03/2017  PT End of Session - 11/03/17 0940    Visit Number  5    Number of Visits  8    Date for PT Re-Evaluation  11/08/17    Authorization Type  U[employer]-60 visit combined limit    Authorization - Visit Number  5    Authorization - Number of Visits  37    PT Start Time  0804    PT Stop Time  0840 pt woozy at end of session    PT Time Calculation (min)  36 min    Activity Tolerance  Patient tolerated treatment well;Other (comment) woozy at end of session    Behavior During Therapy  [facility] for tasks assessed/performed       Past Medical History:  Diagnosis Date  . Allergy   . Hyperlipidemia   . Squamous cell skin cancer    SCC    Past Surgical History:  Procedure Laterality Date  . APPENDECTOMY    . BACK SURGERY    . BREAST SURGERY     biopsy  . CHOLECYSTECTOMY    . COLONOSCOPY    . LIPOMA EXCISION     under breast on R side    There were no vitals filed for this visit.  Subjective Assessment - 11/03/17 0806    Subjective  Pt reported dizziness is back and terrible, pt reported 10/10 last Sunday. Pt denied illness.     Pertinent History  Allergies, HLD, hx of prediabetes, hx of colon polyp    Patient Stated Goals  No dizziness, prior to starting job in March.     Currently in Pain?  No/denies             Vestibular Assessment - 11/03/17 0809      Positional Testing   Dix-Hallpike  Dix-Hallpike Right;Dix-Hallpike Left      Dix-Hallpike Right   Dix-Hallpike Right Duration  5/10 dizziness with 30 sec. delayed onset    Dix-Hallpike Right Symptoms  Upbeat, right rotatory nystagmus      Dix-Hallpike Left   Dix-Hallpike Left Duration  8-9/10  dizziness >1 minute    Dix-Hallpike Left Symptoms  Upbeat, left rotatory nystagmus               Vestibular Treatment/Exercise - 11/03/17 8182      Vestibular Treatment/Exercise   Vestibular Treatment Provided  Canalith Repositioning       EPLEY MANUEVER RIGHT   Number of Reps   2    Overall Response  Improved Symptoms    Response Details   Pt reported dizziness 2/10. PT used vibrator during second position during second treatment.        EPLEY MANUEVER LEFT   Number of Reps   2    Overall Response   Improved Symptoms     RESPONSE DETAILS LEFT  Pt reported 1/10 dizziness after treatment. PT used vibrator to mastoid process during second position during second treatment.             PT Education - 11/03/17 9937    Education provided  Yes    Education Details  PT instructed pt on incr. frequency to 2x/week prior to cruise, since dizziness has incr. and to discuss with MD  this afternoon (pt has appt with MD) and PT will send re-cert. PT encouraged pt to not perform habituation today and that she might feel wooziness after treatment for 24-48 hours. PT asked pt to discuss medications with MD, as this is outside of PT scope of practice, as pt had questions re: meds.     Person(s) Educated  Patient    Methods  Explanation    Comprehension  Verbalized understanding       PT Short Term Goals - 09/09/17 1143      PT SHORT TERM GOAL #1   Title  same as LTGs        PT Long Term Goals - 10/13/17 1012      PT LONG TERM GOAL #1   Title  Pt will be IND in HEP to improve dizziness and balance. TARGET DATE FOR ALL LTGS: 10/07/17 (all unmet goals will be carried over to new POC: 11/06/17)    Status  Not Met      PT LONG TERM GOAL #2   Title  PT will perform FGA and write goal as indicated.     Baseline  30/30 on 09/22/17    Status  Deferred      PT LONG TERM GOAL #3   Title  Pt will report 0/10 dizziness during all positional testing.     Status  Not Met      PT LONG TERM  GOAL #4   Title  Write amb. goal as indicated once vertigo resolved.     Status  Deferred            Plan - 11/03/17 0940    Clinical Impression Statement  Pt presented with incr. in dizziness since Sunday with s/s consistent with B pBPPV. S/s improved with Epley treatment, with PT using vibrator during second position of second treatement as s/s are consistent with cupulolithiasis vs. canalithiasis. PT ended session early 2/2 reporting wooziness and she drove herself today and wished to have a break after session. Pt would benefit from skilled PT to improve dizziness and safety. PT requesting for incr. frequency (2x/week for 2 weeks) 2/2 pt is leaving for a cruise and would like dizziness to be controlled.     Rehab Potential  Good    Clinical Impairments Affecting Rehab Potential  see above    PT Frequency  2x / week    PT Duration  2 weeks requesting incr. frequency    PT Treatment/Interventions  ADLs/Self Care Home Management;Biofeedback;Canalith Repostioning;Therapeutic activities;Therapeutic exercise;Manual techniques;Vestibular;Functional mobility training;Stair training;Gait training;DME Instruction;Patient/family education;Neuromuscular re-education;Balance training    PT Next Visit Plan  Re-assess for B pBPPV treat prn. Review habituation HEP prn. Check goals and potentially d/c if BPPV resolved.     Consulted and Agree with Plan of Care  Patient       Patient will benefit from skilled therapeutic intervention in order to improve the following deficits and impairments:  Abnormal gait, Dizziness, Decreased mobility, Decreased balance  Visit Diagnosis: BPPV (benign paroxysmal positional vertigo), bilateral - Plan: PT plan of care cert/re-cert  Dizziness and giddiness - Plan: PT plan of care cert/re-cert     Problem List Patient Active Problem List   Diagnosis Date Noted  . Bilateral headaches 08/17/2017  . Sinus disease 08/17/2017  . Cephalalgia 08/17/2017  . New  persistent daily headache 08/11/2017  . Adrenal mass ([place]) 08/06/2017  . Long term prescription benzodiazepine use 04/30/2017  . Macrocytosis without anemia 02/28/2016  . Encounter for preventive health  examination 02/27/2016  . BMI 25.0-25.9,adult 02/27/2016  . BPPV (benign paroxysmal positional vertigo) 08/07/2015  . Anxiety state 05/25/2015  . Prediabetes 11/30/2014  . Vitamin D deficiency 11/30/2014  . Lipoma of back 06/14/2014  . Dyslipidemia 06/13/2014  . History of adenomatous polyp of colon 06/13/2014    Donelle Hise L 11/03/2017, 10:11 AM  [facility] [address] [place], [place], [address] Phone: [phone]   Fax:  [phone]  Name: Carisma Troupe MRN: [id number] Date of Birth: 1961/04/15  Geoffry Paradise, PT,DPT 11/03/17 10:12 AM Phone: [phone] Fax: [phone]

## 2017-11-03 NOTE — Progress Notes (Signed)
Mikayla Tucker , 08-09-1961, 57 y.o., female MRN: [id number] Patient Care Team    Relationship Specialty Notifications Start End  Ma Hillock, DO PCP - General Family Medicine  08/07/15   Kennon Holter, NP Nurse Practitioner Nurse Practitioner  02/27/16    Comment: Gynecology  Gatha Mayer, MD Consulting Physician Gastroenterology  04/30/17     Chief Complaint  Patient presents with  . Travel Consult    wants motion sickness medication/patch     Subjective: Pt presents for an OV with h/o  motion sickness when she is on a cruise and upcoming trip planned. She would like to try scopolamine patches. She has never used patches prior. She does suffer from dizziness/vertigo like symptoms, in which she is completing vestibular rehab with good results.  Depression screen [facility] 2/9 11/03/2017 04/29/2017  Decreased Interest 0 0  Down, Depressed, Hopeless 0 0  PHQ - 2 Score 0 0    Allergies  Allergen Reactions  . Hydrocodone-Acetaminophen Hives   Social History   Tobacco Use  . Smoking status: Former Smoker    Packs/day: 1.00    Years: 10.00    Pack years: 10.00  . Smokeless tobacco: Never Used  . Tobacco comment: quit 57 yrs old  Substance Use Topics  . Alcohol use: Yes    Alcohol/week: 4.2 oz    Types: 7 Glasses of wine per week   Past Medical History:  Diagnosis Date  . Allergy   . Hyperlipidemia   . Squamous cell skin cancer    SCC   Past Surgical History:  Procedure Laterality Date  . APPENDECTOMY    . BACK SURGERY    . BREAST SURGERY     biopsy  . CHOLECYSTECTOMY    . COLONOSCOPY    . LIPOMA EXCISION     under breast on R side   Family History  Adopted: Yes  Problem Relation Age of Onset  . Colon cancer Neg Hx    Allergies as of 11/03/2017      Reactions   Hydrocodone-acetaminophen Hives      Medication List        Accurate as of 11/03/17 11:54 AM. Always use your most recent med list.          atorvastatin 80 MG tablet Commonly known  as:  LIPITOR Take 1 tablet (80 mg total) by mouth daily.   b complex vitamins capsule Take 1 capsule by mouth daily.   Cholecalciferol 1000 units tablet Take 3,000 Units by mouth daily.   fexofenadine 180 MG tablet Commonly known as:  ALLEGRA Take 180 mg by mouth daily.   fluticasone 50 MCG/ACT nasal spray Commonly known as:  FLONASE Place 2 sprays into both nostrils daily.   LORazepam 1 MG tablet Commonly known as:  ATIVAN Take 1 tablet (1 mg total) by mouth 2 (two) times daily as needed for anxiety.   Magnesium 250 MG Tabs Take 1 tablet by mouth.   scopolamine 1 MG/3DAYS Commonly known as:  TRANSDERM-SCOP (1.5 MG) Place 1 patch (1.5 mg total) onto the skin every 3 (three) days.       All past medical history, surgical history, allergies, family history, immunizations andmedications were updated in the EMR today and reviewed under the history and medication portions of their EMR.     ROS: Negative, with the exception of above mentioned in HPI   Objective:  BP 133/89 (BP Location: Right Arm, Patient Position: Sitting, Cuff Size: Normal)  Pulse 70   Temp 97.9 F (36.6 C)   Resp 20   Wt 151 lb (68.5 kg)   SpO2 96%   BMI 25.92 kg/m  Body mass index is 25.92 kg/m. Gen: Afebrile. No acute distress. Nontoxic in appearance, well developed, well nourished.  HENT: AT. [place]. Bilateral TM visualized with mild fullness right . MMM, no oral lesions. Bilateral nares without erythma/drainage. Throat without erythema or exudates.  Eyes:Pupils Equal Round Reactive to light, Extraocular movements intact,  Conjunctiva without redness, discharge or icterus. CV: RRR  Chest: CTAB, no wheeze or crackles. Neuro: Normal gait. PERLA. EOMi. Alert. Oriented x3  No exam data present No results found. No results found for this or any previous visit (from the past 24 hour(s)).  Assessment/Plan: Mikayla Tucker is a 57 y.o. female present for OV for  Travel advice encounter - counseling  provided on proper use scopolamine and potential SE profile.  - scopolamine patches 4 was provided to patient today. She is advised to place behind ear 4-12 hours prior to sailing. Removed/replace with the patch every 72 hours when necessary.  Reviewed expectations re: course of current medical issues.  Discussed self-management of symptoms.  Outlined signs and symptoms indicating need for more acute intervention.  Patient verbalized understanding and all questions were answered.  Patient received an After-Visit Summary.   > 15 minutes spent with patient, >50% of time spent face to face counseling and/or coordinating care.   No orders of the defined types were placed in this encounter.    Note is dictated utilizing voice recognition software. Although note has been proof read prior to signing, occasional typographical errors still can be missed. If any questions arise, please do not hesitate to call for verification.   electronically signed by:  Howard Pouch, DO  [place]

## 2017-11-03 NOTE — Patient Instructions (Addendum)
Have fun on your trip.  I have called in scopolamine patches for you. Best if started 12 hours prior to potential symptoms. Can be applied as little as 4 hours prior, but not as effective. Remove old patch and apply new patch every 72 hours.

## 2017-11-03 NOTE — Telephone Encounter (Signed)
ok 

## 2017-11-06 ENCOUNTER — Ambulatory Visit: Payer: Commercial Managed Care - PPO | Attending: Neurology

## 2017-11-06 ENCOUNTER — Other Ambulatory Visit: Payer: Self-pay

## 2017-11-06 DIAGNOSIS — R42 Dizziness and giddiness: Secondary | ICD-10-CM | POA: Diagnosis present

## 2017-11-06 DIAGNOSIS — H8113 Benign paroxysmal vertigo, bilateral: Secondary | ICD-10-CM | POA: Diagnosis present

## 2017-11-06 DIAGNOSIS — R2689 Other abnormalities of gait and mobility: Secondary | ICD-10-CM | POA: Insufficient documentation

## 2017-11-06 NOTE — Therapy (Addendum)
[facility] [address] [place], [place], [address] Phone: [phone]   Fax:  [phone]  Physical Therapy Treatment  Patient Details  Name: Mikayla Tucker MRN: [id number] Date of Birth: 07-27-1961 Referring Provider: Dr. Brett Tucker   Encounter Date: 11/06/2017  PT End of Session - 11/06/17 1009    Visit Number  6    Number of Visits  8    Date for PT Re-Evaluation  11/08/17    Authorization Type  UHC-60 visit combined limit    Authorization - Visit Number  6    Authorization - Number of Visits  60    PT Start Time  0930    PT Stop Time  1000 treating vertigo only at this time    PT Time Calculation (min)  30 min    Activity Tolerance  Patient tolerated treatment well    Behavior During Therapy  [facility] for tasks assessed/performed       Past Medical History:  Diagnosis Date  . Allergy   . Hyperlipidemia   . Squamous cell skin cancer    SCC    Past Surgical History:  Procedure Laterality Date  . APPENDECTOMY    . BACK SURGERY    . BREAST SURGERY     biopsy  . CHOLECYSTECTOMY    . COLONOSCOPY    . LIPOMA EXCISION     under breast on R side    There were no vitals filed for this visit.  Subjective Assessment - 11/06/17 0933    Subjective  Pt reported dizziness is better but is woozy. Pt has performed habituation daily.     Pertinent History  Allergies, HLD, hx of prediabetes, hx of colon polyp    Patient Stated Goals  No dizziness, prior to starting job in March.     Currently in Pain?  No/denies             Vestibular Assessment - 11/06/17 0936      Positional Testing   Dix-Hallpike  Dix-Hallpike Right;Dix-Hallpike Left      Dix-Hallpike Right   Dix-Hallpike Right Duration  1/10 dizziness lasting 30 sec.     Dix-Hallpike Right Symptoms  Upbeat, right rotatory nystagmus      Dix-Hallpike Left   Dix-Hallpike Left Duration  2/10 dizziness, 30 sec.    Dix-Hallpike Left Symptoms  Upbeat, left  rotatory nystagmus               Vestibular Treatment/Exercise - 11/06/17 0942      Vestibular Treatment/Exercise   Vestibular Treatment Provided  Canalith Repositioning 2nd treatment: pt performed head shaking prior to treat    Canalith Repositioning  Epley Manuever Right;Epley Manuever Left       EPLEY MANUEVER RIGHT   Number of Reps   1    Overall Response  Symptoms Resolved    Response Details   Pt reported no dizziness after head shaking and first treatment. No nystagmus noted after treatment. Pt reported less wooziness upon sitting upright.       EPLEY MANUEVER LEFT   Number of Reps   2    Overall Response   Improved Symptoms     RESPONSE DETAILS LEFT  Pt reported 1-2/10 dizziness, which quickly subsided within 20 sec. Pt reported less wooziness upon sitting upright.               PT Short Term Goals - 09/09/17 1143      PT SHORT TERM GOAL #1  Title  same as LTGs        PT Long Term Goals - 10/13/17 1012      PT LONG TERM GOAL #1   Title  Pt will be IND in HEP to improve dizziness and balance. TARGET DATE FOR ALL LTGS: 10/07/17 (all unmet goals will be carried over to new POC: 11/06/17)    Status  Not Met      PT LONG TERM GOAL #2   Title  PT will perform FGA and write goal as indicated.     Baseline  30/30 on 09/22/17    Status  Deferred      PT LONG TERM GOAL #3   Title  Pt will report 0/10 dizziness during all positional testing.     Status  Not Met      PT LONG TERM GOAL #4   Title  Write amb. goal as indicated once vertigo resolved.     Status  Deferred            Plan - 11/06/17 1010    Clinical Impression Statement  Pt demonstrated progress as initial c/o dizziness during B Dix-Hallpike is decreased and nystagmus lasting shorter duration during B testing. PT performed head shaking in seated position x20 reps, prior to Epley second treatment. Pt's R pBPPV s/s resolved and L pBPPV s/s decr. after head shaking and second treatment.  Continue with POC.     Rehab Potential  Good    Clinical Impairments Affecting Rehab Potential  see above    PT Frequency  2x / week    PT Duration  2 weeks requesting incr. frequency    PT Treatment/Interventions  ADLs/Self Care Home Management;Biofeedback;Canalith Repostioning;Therapeutic activities;Therapeutic exercise;Manual techniques;Vestibular;Functional mobility training;Stair training;Gait training;DME Instruction;Patient/family education;Neuromuscular re-education;Balance training    PT Next Visit Plan  Re-assess for B pBPPV treat prn. Review habituation HEP prn. Check goals and potentially d/c if BPPV resolved.     Consulted and Agree with Plan of Care  Patient       Patient will benefit from skilled therapeutic intervention in order to improve the following deficits and impairments:  Abnormal gait, Dizziness, Decreased mobility, Decreased balance  Visit Diagnosis: BPPV (benign paroxysmal positional vertigo), bilateral     Problem List Patient Active Problem List   Diagnosis Date Noted  . Bilateral headaches 08/17/2017  . Sinus disease 08/17/2017  . Cephalalgia 08/17/2017  . New persistent daily headache 08/11/2017  . Adrenal mass ([place]) 08/06/2017  . Long term prescription benzodiazepine use 04/30/2017  . Macrocytosis without anemia 02/28/2016  . Encounter for preventive health examination 02/27/2016  . BMI 25.0-25.9,adult 02/27/2016  . BPPV (benign paroxysmal positional vertigo) 08/07/2015  . Anxiety state 05/25/2015  . Prediabetes 11/30/2014  . Vitamin D deficiency 11/30/2014  . Lipoma of back 06/14/2014  . Dyslipidemia 06/13/2014  . History of adenomatous polyp of colon 06/13/2014    Mikayla Tucker L 11/06/2017, 11:58 AM  [facility] [address] [place], [place], [address] Phone: [phone]   Fax:  [phone]  Name: Mikayla Tucker MRN: [id number] Date of Birth: [birth year]  Mikayla Tucker,  PT,DPT 11/06/17 11:58 AM Phone: [phone] Fax: [phone]

## 2017-11-12 ENCOUNTER — Ambulatory Visit: Payer: Commercial Managed Care - PPO

## 2017-11-12 DIAGNOSIS — R42 Dizziness and giddiness: Secondary | ICD-10-CM

## 2017-11-12 DIAGNOSIS — H8113 Benign paroxysmal vertigo, bilateral: Secondary | ICD-10-CM | POA: Diagnosis not present

## 2017-11-12 DIAGNOSIS — R2689 Other abnormalities of gait and mobility: Secondary | ICD-10-CM

## 2017-11-12 NOTE — Therapy (Addendum)
[facility] [address] [place], [place], [address] Phone: [phone]   Fax:  [phone]  Physical Therapy Treatment  Patient Details  Name: Mikayla Tucker MRN: [id number] Date of Birth: Oct 23, 1960 Referring Provider: Dr. Brett Fairy   Encounter Date: 11/12/2017  PT End of Session - 11/12/17 0831    Visit Number  7    Number of Visits  8    Date for PT Re-Evaluation  11/17/17    Authorization Type  UHC-60 visit combined limit    Authorization - Visit Number  7    Authorization - Number of Visits  60    PT Start Time  0805    PT Stop Time  0819    PT Time Calculation (min)  14 min    Activity Tolerance  Patient tolerated treatment well    Behavior During Therapy  [facility] for tasks assessed/performed       Past Medical History:  Diagnosis Date  . Allergy   . Hyperlipidemia   . Squamous cell skin cancer    SCC    Past Surgical History:  Procedure Laterality Date  . APPENDECTOMY    . BACK SURGERY    . BREAST SURGERY     biopsy  . CHOLECYSTECTOMY    . COLONOSCOPY    . LIPOMA EXCISION     under breast on R side    There were no vitals filed for this visit.  Subjective Assessment - 11/12/17 0807    Subjective  Pt reported dizziness is better since last visit. She hasn't had any major dizzy episodes.     Pertinent History  Allergies, HLD, hx of prediabetes, hx of colon polyp    Patient Stated Goals  No dizziness, prior to starting job in March.              Vestibular Assessment - 11/12/17 0809      Positional Testing   Dix-Hallpike  Dix-Hallpike Right;Dix-Hallpike Left      Dix-Hallpike Right   Dix-Hallpike Right Duration  No dizziness. PT noted nystamgus every 3-4 seconds.    Dix-Hallpike Right Symptoms  Upbeat, right rotatory nystagmus      Dix-Hallpike Left   Dix-Hallpike Left Duration  1/10 dizziness, < 30sec.    Dix-Hallpike Left Symptoms  Upbeat, left rotatory nystagmus                Vestibular Treatment/Exercise - 11/12/17 9450      Vestibular Treatment/Exercise   Vestibular Treatment Provided  Canalith Repositioning    Canalith Repositioning  Epley Manuever Left       EPLEY MANUEVER LEFT   Number of Reps   1    Overall Response   Improved Symptoms     RESPONSE DETAILS LEFT  Pt reported wooziness after treatment but felt better overall.             PT Education - 11/12/17 0830    Education provided  Yes    Education Details  PT discussed limited alcohol comsumption during cruise and to continue habituation exercises as prescribed. PT also placed pt on hold as pt is going on cruise and vertigo has improved.     Person(s) Educated  Patient    Methods  Explanation    Comprehension  Verbalized understanding       PT Short Term Goals - 09/09/17 1143      PT SHORT TERM GOAL #1   Title  same as LTGs  PT Long Term Goals - 11/12/17 6384      PT LONG TERM GOAL #1   Title  Pt will be IND in HEP to improve dizziness and balance. TARGET DATE FOR ALL LTGS: 10/07/17 (all unmet goals will be carried over to new POC: 11/06/17). Pt placed on hold until 11/26/17    Status  Not Met      PT LONG TERM GOAL #2   Title  PT will perform FGA and write goal as indicated.     Baseline  30/30 on 09/22/17    Status  Deferred      PT LONG TERM GOAL #3   Title  Pt will report 0/10 dizziness during all positional testing.     Status  Not Met      PT LONG TERM GOAL #4   Title  Write amb. goal as indicated once vertigo resolved.     Status  Deferred            Plan - 11/12/17 0831    Clinical Impression Statement  Pt demonstrated progress as she reported improved dizziness during all positional testing. Pt continues to experience small amplitude upbeating torsional nystagmus during B Dix-Hallpike testing but continues to demonstrate improvement and decr. dizziness. PT did not perform head shaking or vibrator to mastoid process, as pt leaves  tomorrow for cruise and feels dizziness is tolerable at this time. Pt reported improved s/s after L Epley treatment. PT placing pt on hold until after pt's cruise.     Rehab Potential  Good    Clinical Impairments Affecting Rehab Potential  see above    PT Frequency  2x / week    PT Duration  2 weeks requesting incr. frequency    PT Treatment/Interventions  ADLs/Self Care Home Management;Biofeedback;Canalith Repostioning;Therapeutic activities;Therapeutic exercise;Manual techniques;Vestibular;Functional mobility training;Stair training;Gait training;DME Instruction;Patient/family education;Neuromuscular re-education;Balance training    PT Next Visit Plan  Re-assess for B pBPPV treat prn. Review habituation HEP prn. Check goals and potentially d/c if BPPV resolved.     Consulted and Agree with Plan of Care  Patient       Patient will benefit from skilled therapeutic intervention in order to improve the following deficits and impairments:  Abnormal gait, Dizziness, Decreased mobility, Decreased balance  Visit Diagnosis: BPPV (benign paroxysmal positional vertigo), bilateral  Dizziness and giddiness  Other abnormalities of gait and mobility     Problem List Patient Active Problem List   Diagnosis Date Noted  . Bilateral headaches 08/17/2017  . Sinus disease 08/17/2017  . Cephalalgia 08/17/2017  . New persistent daily headache 08/11/2017  . Adrenal mass ([place]) 08/06/2017  . Long term prescription benzodiazepine use 04/30/2017  . Macrocytosis without anemia 02/28/2016  . Encounter for preventive health examination 02/27/2016  . BMI 25.0-25.9,adult 02/27/2016  . BPPV (benign paroxysmal positional vertigo) 08/07/2015  . Anxiety state 05/25/2015  . Prediabetes 11/30/2014  . Vitamin D deficiency 11/30/2014  . Lipoma of back 06/14/2014  . Dyslipidemia 06/13/2014  . History of adenomatous polyp of colon 06/13/2014    Mikayla Tucker L 11/12/2017, 8:36 AM  [facility] [address] [place], [place], [address] Phone: [phone]   Fax:  [phone]  Name: Mikayla Tucker MRN: [id number] Date of Birth: 1960/11/10  Geoffry Paradise, PT,DPT 11/12/17 8:36 AM Phone: [phone] Fax: [phone]

## 2017-11-25 NOTE — Telephone Encounter (Signed)
There requested tests have not been done yet.  It would be best for the tests to be done here, but lab coverage is inconsistent.  These could be done at other sites in my opinion, but that is an Publishing copy.

## 2017-11-25 NOTE — Telephone Encounter (Signed)
Patient want to know do she need to make and appointment. Please advise

## 2017-11-26 ENCOUNTER — Ambulatory Visit: Payer: Commercial Managed Care - PPO

## 2017-11-26 ENCOUNTER — Other Ambulatory Visit (INDEPENDENT_AMBULATORY_CARE_PROVIDER_SITE_OTHER): Payer: Commercial Managed Care - PPO

## 2017-11-26 DIAGNOSIS — H8113 Benign paroxysmal vertigo, bilateral: Secondary | ICD-10-CM

## 2017-11-26 DIAGNOSIS — E279 Disorder of adrenal gland, unspecified: Secondary | ICD-10-CM | POA: Diagnosis not present

## 2017-11-26 DIAGNOSIS — R2689 Other abnormalities of gait and mobility: Secondary | ICD-10-CM

## 2017-11-26 DIAGNOSIS — E278 Other specified disorders of adrenal gland: Secondary | ICD-10-CM

## 2017-11-26 DIAGNOSIS — R42 Dizziness and giddiness: Secondary | ICD-10-CM

## 2017-11-26 LAB — BASIC METABOLIC PANEL
BUN: 10 mg/dL (ref 6–23)
CALCIUM: 10.4 mg/dL (ref 8.4–10.5)
CO2: 28 meq/L (ref 19–32)
CREATININE: 0.59 mg/dL (ref 0.40–1.20)
Chloride: 98 mEq/L (ref 96–112)
GFR: 112.1 mL/min (ref >60.00)
Glucose, Bld: 126 mg/dL — ABNORMAL HIGH (ref 70–99)
Potassium: 4.5 mEq/L (ref 3.5–5.1)
SODIUM: 135 meq/L (ref 135–145)

## 2017-11-26 LAB — CORTISOL: Cortisol, Plasma: 1.3 ug/dL

## 2017-11-26 NOTE — Therapy (Signed)
[facility] [address] [place], [place], [address] Phone: [phone]   Fax:  [phone]  Physical Therapy Treatment  Patient Details  Name: Mikayla Tucker MRN: [id number] Date of Birth: 07-08-1961 Referring Provider: Dr. Brett Fairy   Encounter Date: 11/26/2017  PT End of Session - 11/26/17 1258    Visit Number  8    Number of Visits  8    Date for PT Re-Evaluation  11/17/17    Authorization Type  [employer]-60 visit combined limit    PT Start Time  1015    PT Stop Time  1030    PT Time Calculation (min)  15 min    Activity Tolerance  Patient tolerated treatment well    Behavior During Therapy  [facility] for tasks assessed/performed       Past Medical History:  Diagnosis Date  . Allergy   . Hyperlipidemia   . Squamous cell skin cancer    SCC    Past Surgical History:  Procedure Laterality Date  . APPENDECTOMY    . BACK SURGERY    . BREAST SURGERY     biopsy  . CHOLECYSTECTOMY    . COLONOSCOPY    . LIPOMA EXCISION     under breast on R side    There were no vitals filed for this visit.  Subjective Assessment - 11/26/17 1019    Subjective  pt reports she is feeling much better  since her last pt session. Pt reports her vacation went well and was without any exacerbations of her symptoms and no need for medication. Pt reports not having to do her HEP due to dizziness. But understands that to keep s/s down she will need to continue to do them.      Pertinent History  Allergies, HLD, hx of prediabetes, hx of colon polyp    Patient Stated Goals  No dizziness, prior to starting job in March.     Currently in Pain?  No/denies             Vestibular Assessment - 11/26/17 1018      Positional Testing   Dix-Hallpike  Dix-Hallpike Right;Dix-Hallpike Left      Dix-Hallpike Right   Dix-Hallpike Right Duration  1/10 dizziness. small amp. nystagmus lasting ~30s    Dix-Hallpike Right Symptoms  Upbeat, right rotatory  nystagmus      Dix-Hallpike Left   Dix-Hallpike Left Duration  1/10 dizziness, < 30sec.    Dix-Hallpike Left Symptoms  Upbeat, left rotatory nystagmus                        PT Education - 11/26/17 1257    Education provided  Yes    Education Details  PT  D/C, HEP compliance and importance of consistency in her HEP.    Person(s) Educated  Patient    Methods  Explanation    Comprehension  Verbalized understanding         PT Short Term Goals - 09/09/17 1143      PT SHORT TERM GOAL #1   Title  same as LTGs       PT Long Term Goals - 11/26/17 1307      PT LONG TERM GOAL #1   Title  Pt will be IND in HEP to improve dizziness and balance. TARGET DATE FOR ALL LTGS: 10/07/17 (all unmet goals will be carried over to new POC: 11/06/17). Pt placed on hold until 11/26/17  Baseline  met 2/21    Status  Achieved      PT LONG TERM GOAL #2   Title  PT will perform FGA and write goal as indicated.     Baseline  30/30 on 09/22/17 (perfect score)    Status  Deferred      PT LONG TERM GOAL #3   Title  Pt will report 0/10 dizziness during all positional testing.     Baseline  pt reports 1/10 dizziness during dix halpike assessment dizziness last <30.     Status  Partially Met      PT LONG TERM GOAL #4   Title  Write amb. goal as indicated once vertigo resolved.     Baseline  Deferred as pt amb. over even/uneven surfaces without LOB.     Status  Deferred          Plan - 11/26/17 1259    Clinical Impression Statement  Pt has met or sufficiently met all of her long term goals today. Pt continues to have mild dizziness (1/10) during bilateral dix-hallpike ( mild rotational up beating nystagmus bilaterally)  assessment of BPPV.  However, with her habituation exercises pt has been able to significantly reduce dizziness and function without her dizziness impairing her activates. Pt and PT have discussed, and agree on, D/C from PT. Pt and PT both feel pt has made significant  progress and will be able to continue to do so with her long term HEP.     Rehab Potential  Good    PT Next Visit Plan  D/C    Consulted and Agree with Plan of Care  Patient       Patient will benefit from skilled therapeutic intervention in order to improve the following deficits and impairments:     Visit Diagnosis: BPPV (benign paroxysmal positional vertigo), bilateral  Dizziness and giddiness  Other abnormalities of gait and mobility     Problem List Patient Active Problem List   Diagnosis Date Noted  . Bilateral headaches 08/17/2017  . Sinus disease 08/17/2017  . Cephalalgia 08/17/2017  . New persistent daily headache 08/11/2017  . Adrenal mass ([place]) 08/06/2017  . Long term prescription benzodiazepine use 04/30/2017  . Macrocytosis without anemia 02/28/2016  . Encounter for preventive health examination 02/27/2016  . BMI 25.0-25.9,adult 02/27/2016  . BPPV (benign paroxysmal positional vertigo) 08/07/2015  . Anxiety state 05/25/2015  . Prediabetes 11/30/2014  . Vitamin D deficiency 11/30/2014  . Lipoma of back 06/14/2014  . Dyslipidemia 06/13/2014  . History of adenomatous polyp of colon 06/13/2014    PHYSICAL THERAPY DISCHARGE SUMMARY  Visits from Start of Care: 8  Current functional level related to goals / functional outcomes: PT Long Term Goals - 11/26/17 1307      PT LONG TERM GOAL #1   Title  Pt will be IND in HEP to improve dizziness and balance. TARGET DATE FOR ALL LTGS: 10/07/17 (all unmet goals will be carried over to new POC: 11/06/17). Pt placed on hold until 11/26/17    Baseline  met 2/21    Status  Achieved      PT LONG TERM GOAL #2   Title  PT will perform FGA and write goal as indicated.     Baseline  30/30 on 09/22/17    Status  Deferred      PT LONG TERM GOAL #3   Title  Pt will report 0/10 dizziness during all positional testing.     Baseline  pt reports  1/10 dizziness during dix halpike assessment dizziness last <30.     Status  Partially  Met      PT LONG TERM GOAL #4   Title  Write amb. goal as indicated once vertigo resolved.     Status  Deferred         Remaining deficits: See above    Education / Equipment: See above  Plan: Patient agrees to discharge.  Patient goals were met. Patient is being discharged due to meeting the stated rehab goals.  ?????         Waunita Schooner SPT 11/26/2017, 1:10 PM   [facility] [address] [place], [place], [address] Phone: [phone]   Fax:  [phone]  Name: Emelie Newsom MRN: [id number] Date of Birth: May 06, 1961

## 2017-11-27 NOTE — Telephone Encounter (Signed)
I have spoken with patient & she had completed some of her labs. Then she will be doing her 24 hr urine collection & returning it to [facility].

## 2017-11-30 ENCOUNTER — Other Ambulatory Visit: Payer: Self-pay | Admitting: *Deleted

## 2017-11-30 DIAGNOSIS — E278 Other specified disorders of adrenal gland: Secondary | ICD-10-CM

## 2017-12-02 LAB — ALDOSTERONE + RENIN ACTIVITY W/ RATIO
ALDO / PRA Ratio: 3.5 Ratio (ref 0.9–28.9)
Aldosterone: 3 ng/dL
RENIN ACTIVITY: 0.86 ng/mL/h (ref 0.25–5.82)

## 2017-12-03 ENCOUNTER — Telehealth: Payer: Self-pay | Admitting: Endocrinology

## 2017-12-03 NOTE — Telephone Encounter (Signed)
Please call pt at [phone] re: urine test results Dropped off at PCP on Monday

## 2017-12-03 NOTE — Telephone Encounter (Signed)
Patient stated that she has an appointment with  GI tomorrow and want to cancel the appt, but she can not get in contact with them.. Please contact them or give her a correct phone number where she can reach them so she will not get a no show fee.  And she is wating to get a call about her 24 hr urine test.  Please advise

## 2017-12-03 NOTE — Telephone Encounter (Signed)
error 

## 2017-12-04 ENCOUNTER — Inpatient Hospital Stay: Admission: RE | Admit: 2017-12-04 | Payer: Commercial Managed Care - PPO | Source: Ambulatory Visit

## 2017-12-04 LAB — CATECHOLAMINES, FRACTIONATED, URINE, 24 HOUR
CREATININE, URINE MG/DAY-CATEUR: 0.54 g/(24.h) (ref 0.50–2.15)
Calculated Total (E+NE): 21 mcg/24 h — ABNORMAL LOW (ref 26–121)
Dopamine, 24 hr Urine: 60 mcg/24 h (ref 52–480)
Norepinephrine, 24 hr Ur: 21 mcg/24 h (ref 15–100)
VOLUME, URINE-VMAUR: 3000 mL

## 2017-12-04 NOTE — Telephone Encounter (Signed)
Pt has already handled this, we do not cancel appt for pts with other office that is something that have to handle.

## 2017-12-04 NOTE — Telephone Encounter (Signed)
Gave patient the results yesterday and she stated an understanding

## 2017-12-07 ENCOUNTER — Telehealth: Payer: Self-pay

## 2017-12-07 NOTE — Telephone Encounter (Signed)
Called pt.

## 2017-12-07 NOTE — Telephone Encounter (Signed)
-----   Message from Renato Shin, MD sent at 12/05/2017 11:21 AM EST ----- please call patient: Normal--good

## 2017-12-07 NOTE — Telephone Encounter (Signed)
Called pt. Gave results. Pt verbalized understanding.

## 2017-12-21 ENCOUNTER — Inpatient Hospital Stay: Admission: RE | Admit: 2017-12-21 | Payer: Commercial Managed Care - PPO | Source: Ambulatory Visit

## 2018-01-12 ENCOUNTER — Other Ambulatory Visit: Payer: Commercial Managed Care - PPO

## 2018-01-14 ENCOUNTER — Other Ambulatory Visit: Payer: Self-pay | Admitting: Endocrinology

## 2018-01-14 ENCOUNTER — Ambulatory Visit (INDEPENDENT_AMBULATORY_CARE_PROVIDER_SITE_OTHER)
Admission: RE | Admit: 2018-01-14 | Discharge: 2018-01-14 | Disposition: A | Discharge status: Home / Self Care | Payer: Commercial Managed Care - PPO | Source: Ambulatory Visit | Attending: Endocrinology | Admitting: Endocrinology

## 2018-01-14 DIAGNOSIS — E278 Other specified disorders of adrenal gland: Secondary | ICD-10-CM

## 2018-01-14 DIAGNOSIS — E279 Disorder of adrenal gland, unspecified: Secondary | ICD-10-CM | POA: Diagnosis not present

## 2018-02-08 ENCOUNTER — Telehealth: Payer: Self-pay | Admitting: Neurology

## 2018-02-08 ENCOUNTER — Other Ambulatory Visit: Payer: Self-pay | Admitting: Neurology

## 2018-02-08 DIAGNOSIS — R42 Dizziness and giddiness: Secondary | ICD-10-CM

## 2018-02-08 DIAGNOSIS — R55 Syncope and collapse: Secondary | ICD-10-CM

## 2018-02-08 DIAGNOSIS — H8110 Benign paroxysmal vertigo, unspecified ear: Secondary | ICD-10-CM

## 2018-02-08 NOTE — Telephone Encounter (Signed)
Pt said the vertigo started again on Saturday, she is requesting another referral sent to PT-she was seeing Anderson Malta. Please call to let her know when the referral has been sent

## 2018-02-08 NOTE — Telephone Encounter (Signed)
Called the pt to make her aware that the referral for PT order was placed and sent today. Informed her that they will call her to get her scheduled. Pt verbalized understanding and was appreciative for the call

## 2018-02-09 ENCOUNTER — Encounter: Payer: Self-pay | Admitting: Physical Therapy

## 2018-02-09 ENCOUNTER — Ambulatory Visit: Payer: Commercial Managed Care - PPO | Attending: Neurology | Admitting: Physical Therapy

## 2018-02-09 ENCOUNTER — Other Ambulatory Visit: Payer: Self-pay

## 2018-02-09 DIAGNOSIS — H8112 Benign paroxysmal vertigo, left ear: Secondary | ICD-10-CM | POA: Insufficient documentation

## 2018-02-09 NOTE — Patient Instructions (Signed)
Sit to Side-Lying    Sit on edge of bed. 1. Turn head 45 to right. 2. Maintain head position and lie down slowly on left side. Hold until symptoms subside. 3. Sit up slowly. Hold until symptoms subside. 4. Turn head 45 to left. 5. Maintain head position and lie down slowly on right side. Hold until symptoms subside. 6. Sit up slowly. Repeat sequence _5___ times per session. Do _3___ sessions per day.  Copyright  VHI. All rights reserved.  Self Treatment for Left Posterior / Anterior Canalithiasis    Sitting on bed: 1. Turn head 45 left. (a) Lie back slowly, shoulders on pillow, head on bed. (b) Hold 30____ seconds. 2. Keeping head on bed, turn head 90 right. Hold __30__ seconds. 3. Roll to right, head on 45 angle down toward bed. Hold __30__ seconds. 4. Sit up on right side of bed. Repeat _3___ times per session. Do __1-2__ sessions per day.  Copyright  VHI. All rights reserved.

## 2018-02-10 NOTE — Therapy (Signed)
[facility] [address] [place], [place], [address] Phone: [phone]   Fax:  [phone]  Physical Therapy Evaluation  Patient Details  Name: Mikayla Tucker MRN: [id number] Date of Birth: 1960/12/03 Referring Provider: Larey Seat, MD   Encounter Date: 02/09/2018  PT End of Session - 02/10/18 2210    Visit Number  1    Number of Visits  1    PT Start Time  1104    PT Stop Time  1146    PT Time Calculation (min)  42 min       Past Medical History:  Diagnosis Date  . Allergy   . Hyperlipidemia   . Squamous cell skin cancer    SCC    Past Surgical History:  Procedure Laterality Date  . APPENDECTOMY    . BACK SURGERY    . BREAST SURGERY     biopsy  . CHOLECYSTECTOMY    . COLONOSCOPY    . LIPOMA EXCISION     under breast on R side    There were no vitals filed for this visit.   Subjective Assessment - 02/10/18 2203    Subjective  pt reports feeling "not clear" and not "sharp" and had dizziness earlier when she turned her head quickly earlier - states she had dizziness upon waking up Sat. am on 02-06-18    Pertinent History  Allergies, HLD, hx of prediabetes, hx of colon polyp    Patient Stated Goals  No dizziness, must be able to climb ladder as she has a seasonal     Currently in Pain?  No/denies         [facility] PT Assessment - 02/10/18 0001      Assessment   Medical Diagnosis  BPPV    Referring Provider  Larey Seat, MD    Prior Therapy  pt received PT for vertigo in Dec. 2018      Balance Screen   Has the patient fallen in the past 6 months  No    Has the patient had a decrease in activity level because of a fear of falling?   Yes    Is the patient reluctant to leave their home because of a fear of falling?   No           Vestibular Assessment - 02/10/18 0001      Vestibular Assessment   General Observation  pt states she woke up Sat. am (02-06-18) with dizziness and realized that  she was having a re-occurrence of vertigo        Symptom Behavior   Type of Dizziness  Spinning    Frequency of Dizziness  varies    Duration of Dizziness  seconds    Aggravating Factors  Looking up to the ceiling;Activity in general    Relieving Factors  Head stationary      Positional Testing   Dix-Hallpike  Dix-Hallpike Right;Dix-Hallpike Left      Dix-Hallpike Right   Dix-Hallpike Right Duration  none    Dix-Hallpike Right Symptoms  No nystagmus      Dix-Hallpike Left   Dix-Hallpike Left Duration  approx. 15 secs    Dix-Hallpike Left Symptoms  Downbeat, left rotatory nystagmus      Horizontal Canal Right   Horizontal Canal Right Duration  none    Horizontal Canal Right Symptoms  Normal      Horizontal Canal Left   Horizontal Canal Left Duration  none    Horizontal Canal Left  Symptoms  Normal          Objective measurements completed on examination: See above findings.              PT Education - 02/10/18 2209    Education provided  Yes    Education Details  pt given info on BPPV etiology, habituation, and Epley    Person(s) Educated  Patient    Methods  Explanation;Demonstration;Handout    Comprehension  Verbalized understanding               Plan - 02/10/18 2211    Clinical Impression Statement  Pt had downbeating Lt rotary nystagmus, indicative of Lt anterior canalithiasis;  symptoms appeared to be resolved on 3rd rep of Epley maneuver     Clinical Presentation  Stable    Clinical Presentation due to:  Lt anterior canal BPPV    Clinical Decision Making  Low    Rehab Potential  Good    PT Frequency  One time visit    PT Treatment/Interventions  Canalith Repostioning;ADLs/Self Care Home Management;Neuromuscular re-education;Patient/family education    PT Next Visit Plan  N/A- pt to call if needed if vertigo is not resolved    PT Home Exercise Plan  Epley, habituation    Consulted and Agree with Plan of Care  Patient       Patient will  benefit from skilled therapeutic intervention in order to improve the following deficits and impairments:  Dizziness  Visit Diagnosis: BPPV (benign paroxysmal positional vertigo), left - Plan: PT plan of care cert/re-cert     Problem List Patient Active Problem List   Diagnosis Date Noted  . Bilateral headaches 08/17/2017  . Sinus disease 08/17/2017  . Cephalalgia 08/17/2017  . New persistent daily headache 08/11/2017  . Adrenal mass ([place]) 08/06/2017  . Long term prescription benzodiazepine use 04/30/2017  . Macrocytosis without anemia 02/28/2016  . Encounter for preventive health examination 02/27/2016  . BMI 25.0-25.9,adult 02/27/2016  . BPPV (benign paroxysmal positional vertigo) 08/07/2015  . Anxiety state 05/25/2015  . Prediabetes 11/30/2014  . Vitamin D deficiency 11/30/2014  . Lipoma of back 06/14/2014  . Dyslipidemia 06/13/2014  . History of adenomatous polyp of colon 06/13/2014    Mikayla Tucker, Mikayla Tucker, PT 02/10/2018, 10:20 PM  [facility] [address] [place], [place], [address] Phone: [phone]   Fax:  [phone]  Name: Mikayla Tucker MRN: [id number] Date of Birth: [birth year]

## 2018-04-13 ENCOUNTER — Other Ambulatory Visit: Payer: Self-pay | Admitting: Family Medicine

## 2018-04-13 DIAGNOSIS — E785 Hyperlipidemia, unspecified: Secondary | ICD-10-CM

## 2018-05-12 ENCOUNTER — Encounter: Payer: Commercial Managed Care - PPO | Admitting: Family Medicine

## 2018-06-03 ENCOUNTER — Encounter: Payer: Self-pay | Admitting: Family Medicine

## 2018-06-03 ENCOUNTER — Ambulatory Visit (INDEPENDENT_AMBULATORY_CARE_PROVIDER_SITE_OTHER): Payer: Commercial Managed Care - PPO | Admitting: Family Medicine

## 2018-06-03 VITALS — BP 134/82 | HR 66 | Temp 98.1°F | Resp 20 | Ht 64.0 in | Wt 153.0 lb

## 2018-06-03 DIAGNOSIS — Z0000 Encounter for general adult medical examination without abnormal findings: Secondary | ICD-10-CM

## 2018-06-03 DIAGNOSIS — E663 Overweight: Secondary | ICD-10-CM

## 2018-06-03 DIAGNOSIS — Z23 Encounter for immunization: Secondary | ICD-10-CM | POA: Diagnosis not present

## 2018-06-03 DIAGNOSIS — E785 Hyperlipidemia, unspecified: Secondary | ICD-10-CM

## 2018-06-03 DIAGNOSIS — R945 Abnormal results of liver function studies: Secondary | ICD-10-CM

## 2018-06-03 DIAGNOSIS — R7303 Prediabetes: Secondary | ICD-10-CM | POA: Diagnosis not present

## 2018-06-03 DIAGNOSIS — E559 Vitamin D deficiency, unspecified: Secondary | ICD-10-CM | POA: Diagnosis not present

## 2018-06-03 DIAGNOSIS — D7589 Other specified diseases of blood and blood-forming organs: Secondary | ICD-10-CM | POA: Diagnosis not present

## 2018-06-03 DIAGNOSIS — R7989 Other specified abnormal findings of blood chemistry: Secondary | ICD-10-CM

## 2018-06-03 LAB — CBC WITH DIFFERENTIAL/PLATELET
BASOS ABS: 0 10*3/uL (ref 0.0–0.1)
Basophils Relative: 0.7 % (ref 0.0–3.0)
EOS ABS: 0.2 10*3/uL (ref 0.0–0.7)
Eosinophils Relative: 2.4 % (ref 0.0–5.0)
HCT: 42.2 % (ref 36.0–46.0)
Hemoglobin: 14.3 g/dL (ref 12.0–15.0)
LYMPHS ABS: 2.2 10*3/uL (ref 0.7–4.0)
Lymphocytes Relative: 34.6 % (ref 12.0–46.0)
MCHC: 33.9 g/dL (ref 30.0–36.0)
MCV: 105.1 fl — ABNORMAL HIGH (ref 78.0–100.0)
Monocytes Absolute: 0.5 10*3/uL (ref 0.1–1.0)
Monocytes Relative: 8.1 % (ref 3.0–12.0)
NEUTROS ABS: 3.4 10*3/uL (ref 1.4–7.7)
NEUTROS PCT: 54.2 % (ref 43.0–77.0)
PLATELETS: 310 10*3/uL (ref 150.0–400.0)
RBC: 4.01 Mil/uL (ref 3.87–5.11)
RDW: 13.1 % (ref 11.5–15.5)
WBC: 6.2 10*3/uL (ref 4.0–10.5)

## 2018-06-03 LAB — LIPID PANEL
CHOL/HDL RATIO: 4
Cholesterol: 250 mg/dL — ABNORMAL HIGH (ref 0–200)
HDL: 68.9 mg/dL (ref >39.00)
LDL Cholesterol: 166 mg/dL — ABNORMAL HIGH (ref 0–99)
NONHDL: 181.12
Triglycerides: 75 mg/dL (ref 0.0–149.0)
VLDL: 15 mg/dL (ref 0.0–40.0)

## 2018-06-03 LAB — COMPREHENSIVE METABOLIC PANEL
ALT: 27 U/L (ref 0–35)
AST: 23 U/L (ref 0–37)
Albumin: 4.8 g/dL (ref 3.5–5.2)
Alkaline Phosphatase: 99 U/L (ref 39–117)
BILIRUBIN TOTAL: 0.6 mg/dL (ref 0.2–1.2)
BUN: 9 mg/dL (ref 6–23)
CO2: 30 meq/L (ref 19–32)
CREATININE: 0.6 mg/dL (ref 0.40–1.20)
Calcium: 9.9 mg/dL (ref 8.4–10.5)
Chloride: 96 mEq/L (ref 96–112)
GFR: 109.75 mL/min (ref >60.00)
GLUCOSE: 101 mg/dL — AB (ref 70–99)
Potassium: 4.8 mEq/L (ref 3.5–5.1)
SODIUM: 133 meq/L — AB (ref 135–145)
Total Protein: 7.6 g/dL (ref 6.0–8.3)

## 2018-06-03 LAB — HEMOGLOBIN A1C: Hgb A1c MFr Bld: 5.7 % (ref 4.6–6.5)

## 2018-06-03 LAB — VITAMIN D 25 HYDROXY (VIT D DEFICIENCY, FRACTURES): VITD: 51.39 ng/mL (ref 30.00–100.00)

## 2018-06-03 LAB — TSH: TSH: 0.89 u[IU]/mL (ref 0.35–4.50)

## 2018-06-03 MED ORDER — ATORVASTATIN CALCIUM 80 MG PO TABS: 80.0000 mg | ORAL_TABLET | Freq: Every day | ORAL | 3 refills | Status: DC

## 2018-06-03 NOTE — Patient Instructions (Addendum)
It was great to see you today.  It is ragweed season. Try zyrtec or xyzal.  Flu shot and shingrix provided today. Make nurse visit in 3 months for 2nd Shingrix.   If blood cells are large again, we will place the referral for hematology again for you.   If wanting refills on lorazepam/ativan, we will ned to see you for another appt since that is a controlled substance   Health Maintenance, Female Adopting a healthy lifestyle and getting preventive care can go a long way to promote health and wellness. Talk with your health care provider about what schedule of regular examinations is right for you. This is a good chance for you to check in with your provider about disease prevention and staying healthy. In between checkups, there are plenty of things you can do on your own. Experts have done a lot of research about which lifestyle changes and preventive measures are most likely to keep you healthy. Ask your health care provider for more information. Weight and diet Eat a healthy diet  Be sure to include plenty of vegetables, fruits, low-fat dairy products, and lean protein.  Do not eat a lot of foods high in solid fats, added sugars, or salt.  Get regular exercise. This is one of the most important things you can do for your health. ? Most adults should exercise for at least 150 minutes each week. The exercise should increase your heart rate and make you sweat (moderate-intensity exercise). ? Most adults should also do strengthening exercises at least twice a week. This is in addition to the moderate-intensity exercise.  Maintain a healthy weight  Body mass index (BMI) is a measurement that can be used to identify possible weight problems. It estimates body fat based on height and weight. Your health care provider can help determine your BMI and help you achieve or maintain a healthy weight.  For females 63 years of age and older: ? A BMI below 18.5 is considered underweight. ? A BMI of  18.5 to 24.9 is normal. ? A BMI of 25 to 29.9 is considered overweight. ? A BMI of 30 and above is considered obese.  Watch levels of cholesterol and blood lipids  You should start having your blood tested for lipids and cholesterol at 56 years of age, then have this test every 5 years.  You may need to have your cholesterol levels checked more often if: ? Your lipid or cholesterol levels are high. ? You are older than 57 years of age. ? You are at high risk for heart disease.  Cancer screening Lung Cancer  Lung cancer screening is recommended for adults 82-36 years old who are at high risk for lung cancer because of a history of smoking.  A yearly low-dose CT scan of the lungs is recommended for people who: ? Currently smoke. ? Have quit within the past 15 years. ? Have at least a 30-pack-year history of smoking. A pack year is smoking an average of one pack of cigarettes a day for 1 year.  Yearly screening should continue until it has been 15 years since you quit.  Yearly screening should stop if you develop a health problem that would prevent you from having lung cancer treatment.  Breast Cancer  Practice breast self-awareness. This means understanding how your breasts normally appear and feel.  It also means doing regular breast self-exams. Let your health care provider know about any changes, no matter how small.  If you are in  your 20s or 30s, you should have a clinical breast exam (CBE) by a health care provider every 1-3 years as part of a regular health exam.  If you are 109 or older, have a CBE every year. Also consider having a breast X-ray (mammogram) every year.  If you have a family history of breast cancer, talk to your health care provider about genetic screening.  If you are at high risk for breast cancer, talk to your health care provider about having an MRI and a mammogram every year.  Breast cancer gene (BRCA) assessment is recommended for women who have  family members with BRCA-related cancers. BRCA-related cancers include: ? Breast. ? Ovarian. ? Tubal. ? Peritoneal cancers.  Results of the assessment will determine the need for genetic counseling and BRCA1 and BRCA2 testing.  Cervical Cancer Your health care provider may recommend that you be screened regularly for cancer of the pelvic organs (ovaries, uterus, and vagina). This screening involves a pelvic examination, including checking for microscopic changes to the surface of your cervix (Pap test). You may be encouraged to have this screening done every 3 years, beginning at age 46.  For women ages 25-65, health care providers may recommend pelvic exams and Pap testing every 3 years, or they may recommend the Pap and pelvic exam, combined with testing for human papilloma virus (HPV), every 5 years. Some types of HPV increase your risk of cervical cancer. Testing for HPV may also be done on women of any age with unclear Pap test results.  Other health care providers may not recommend any screening for nonpregnant women who are considered low risk for pelvic cancer and who do not have symptoms. Ask your health care provider if a screening pelvic exam is right for you.  If you have had past treatment for cervical cancer or a condition that could lead to cancer, you need Pap tests and screening for cancer for at least 20 years after your treatment. If Pap tests have been discontinued, your risk factors (such as having a new sexual partner) need to be reassessed to determine if screening should resume. Some women have medical problems that increase the chance of getting cervical cancer. In these cases, your health care provider may recommend more frequent screening and Pap tests.  Colorectal Cancer  This type of cancer can be detected and often prevented.  Routine colorectal cancer screening usually begins at 56 years of age and continues through 56 years of age.  Your health care provider may  recommend screening at an earlier age if you have risk factors for colon cancer.  Your health care provider may also recommend using home test kits to check for hidden blood in the stool.  A small camera at the end of a tube can be used to examine your colon directly (sigmoidoscopy or colonoscopy). This is done to check for the earliest forms of colorectal cancer.  Routine screening usually begins at age 17.  Direct examination of the colon should be repeated every 5-10 years through 56 years of age. However, you may need to be screened more often if early forms of precancerous polyps or small growths are found.  Skin Cancer  Check your skin from head to toe regularly.  Tell your health care provider about any new moles or changes in moles, especially if there is a change in a mole's shape or color.  Also tell your health care provider if you have a mole that is larger than the size  of a pencil eraser.  Always use sunscreen. Apply sunscreen liberally and repeatedly throughout the day.  Protect yourself by wearing long sleeves, pants, a wide-brimmed hat, and sunglasses whenever you are outside.  Heart disease, diabetes, and high blood pressure  High blood pressure causes heart disease and increases the risk of stroke. High blood pressure is more likely to develop in: ? People who have blood pressure in the high end of the normal range (130-139/85-89 mm Hg). ? People who are overweight or obese. ? People who are African American.  If you are 45-38 years of age, have your blood pressure checked every 3-5 years. If you are 33 years of age or older, have your blood pressure checked every year. You should have your blood pressure measured twice-once when you are at a hospital or clinic, and once when you are not at a hospital or clinic. Record the average of the two measurements. To check your blood pressure when you are not at a hospital or clinic, you can use: ? An automated blood pressure  machine at a pharmacy. ? A home blood pressure monitor.  If you are between 53 years and 40 years old, ask your health care provider if you should take aspirin to prevent strokes.  Have regular diabetes screenings. This involves taking a blood sample to check your fasting blood sugar level. ? If you are at a normal weight and have a low risk for diabetes, have this test once every three years after 56 years of age. ? If you are overweight and have a high risk for diabetes, consider being tested at a younger age or more often. Preventing infection Hepatitis B  If you have a higher risk for hepatitis B, you should be screened for this virus. You are considered at high risk for hepatitis B if: ? You were born in a country where hepatitis B is common. Ask your health care provider which countries are considered high risk. ? Your parents were born in a high-risk country, and you have not been immunized against hepatitis B (hepatitis B vaccine). ? You have HIV or AIDS. ? You use needles to inject street drugs. ? You live with someone who has hepatitis B. ? You have had sex with someone who has hepatitis B. ? You get hemodialysis treatment. ? You take certain medicines for conditions, including cancer, organ transplantation, and autoimmune conditions.  Hepatitis C  Blood testing is recommended for: ? Everyone born from [birth year] through 1964. ? Anyone with known risk factors for hepatitis C.  Sexually transmitted infections (STIs)  You should be screened for sexually transmitted infections (STIs) including gonorrhea and chlamydia if: ? You are sexually active and are younger than 56 years of age. ? You are older than 57 years of age and your health care provider tells you that you are at risk for this type of infection. ? Your sexual activity has changed since you were last screened and you are at an increased risk for chlamydia or gonorrhea. Ask your health care provider if you are at  risk.  If you do not have HIV, but are at risk, it may be recommended that you take a prescription medicine daily to prevent HIV infection. This is called pre-exposure prophylaxis (PrEP). You are considered at risk if: ? You are sexually active and do not regularly use condoms or know the HIV status of your partner(s). ? You take drugs by injection. ? You are sexually active with a partner who  has HIV.  Talk with your health care provider about whether you are at high risk of being infected with HIV. If you choose to begin PrEP, you should first be tested for HIV. You should then be tested every 3 months for as long as you are taking PrEP. Pregnancy  If you are premenopausal and you may become pregnant, ask your health care provider about preconception counseling.  If you may become pregnant, take 400 to 800 micrograms (mcg) of folic acid every day.  If you want to prevent pregnancy, talk to your health care provider about birth control (contraception). Osteoporosis and menopause  Osteoporosis is a disease in which the bones lose minerals and strength with aging. This can result in serious bone fractures. Your risk for osteoporosis can be identified using a bone density scan.  If you are 25 years of age or older, or if you are at risk for osteoporosis and fractures, ask your health care provider if you should be screened.  Ask your health care provider whether you should take a calcium or vitamin D supplement to lower your risk for osteoporosis.  Menopause may have certain physical symptoms and risks.  Hormone replacement therapy may reduce some of these symptoms and risks. Talk to your health care provider about whether hormone replacement therapy is right for you. Follow these instructions at home:  Schedule regular health, dental, and eye exams.  Stay current with your immunizations.  Do not use any tobacco products including cigarettes, chewing tobacco, or electronic  cigarettes.  If you are pregnant, do not drink alcohol.  If you are breastfeeding, limit how much and how often you drink alcohol.  Limit alcohol intake to no more than 1 drink per day for nonpregnant women. One drink equals 12 ounces of beer, 5 ounces of wine, or 1 ounces of hard liquor.  Do not use street drugs.  Do not share needles.  Ask your health care provider for help if you need support or information about quitting drugs.  Tell your health care provider if you often feel depressed.  Tell your health care provider if you have ever been abused or do not feel safe at home. This information is not intended to replace advice given to you by your health care provider. Make sure you discuss any questions you have with your health care provider. Document Released: 04/07/2011 Document Revised: 02/28/2016 Document Reviewed: 06/26/2015 Elsevier Interactive Patient Education  [employer].

## 2018-06-03 NOTE — Progress Notes (Signed)
Patient ID: Mikayla Tucker, female  DOB: 11-06-1960, 57 y.o.   MRN: [id number] Patient Care Team    Relationship Specialty Notifications Start End  Ma Hillock, DO PCP - General Family Medicine  08/07/15   Kennon Holter, NP Nurse Practitioner Nurse Practitioner  02/27/16    Comment: Gynecology  Gatha Mayer, MD Consulting Physician Gastroenterology  04/30/17     Chief Complaint  Patient presents with  . Annual Exam    Subjective:  Mikayla Tucker is a 57 y.o.  Female  present for CPE. All past medical history, surgical history, allergies, family history, immunizations, medications and social history were updated in the electronic medical record today. All recent labs, ED visits and hospitalizations within the last year were reviewed.  Health maintenance:  Colonoscopy: Completed 07/04/2016, 5 year recall. One colon polyp. Family history unknown (adopted). Completed by Dr. Carlean Purl. Mammogram: Completed 02/12/2017, patient reports normal. Com[facility] Report requested AGAIN Cervical cancer screening: Com[facility], [facility]. Patient reports normal. Report requested AGAIN Immunizations: tdap UTD completed 02/27/2016, influenza updated today (encouraged yearly), Shingrix #1 provided today Infectious disease screening: HIV and hepatitis C completed Assistive device: None Oxygen NID:POEU Patient has a Dental home. Hospitalizations/ED visits: reviewed Depression screen [facility] 2/9 06/03/2018 11/03/2017 04/29/2017  Decreased Interest 0 0 0  Down, Depressed, Hopeless 0 0 0  PHQ - 2 Score 0 0 0   GAD 7 : Generalized Anxiety Score 06/03/2018  Nervous, Anxious, on Edge 0  Control/stop worrying 0  Worry too much - different things 0  Trouble relaxing 0  Restless 0  Easily annoyed or irritable 0  Afraid - awful might happen 0  Total GAD 7 Score 0     Current Exercise Habits: Home exercise routine, Type of exercise:  walking, Time (Minutes): 60, Frequency (Times/Week): 3, Weekly Exercise (Minutes/Week): 180, Intensity: Mild     Immunization History  Administered Date(s) Administered  . Influenza,inj,Quad PF,6+ Mos 06/13/2014, 11/12/2016, 06/03/2018  . Tdap 02/27/2016  . Zoster Recombinat (Shingrix) 06/03/2018     Past Medical History:  Diagnosis Date  . Allergy   . Hyperlipidemia   . Squamous cell skin cancer    SCC   Allergies  Allergen Reactions  . Hydrocodone-Acetaminophen Hives   Past Surgical History:  Procedure Laterality Date  . APPENDECTOMY    . BACK SURGERY    . BREAST SURGERY     biopsy  . CHOLECYSTECTOMY    . COLONOSCOPY    . LIPOMA EXCISION     under breast on R side   Family History  Adopted: Yes  Problem Relation Age of Onset  . Colon cancer Neg Hx    Social History   Socioeconomic History  . Marital status: Married    Spouse name: Not on file  . Number of children: 1  . Years of education: Not on file  . Highest education level: Not on file  Occupational History    Employer: [employer]    Comment: homemaker  Social Needs  . Financial resource strain: Not on file  . Food insecurity:    Worry: Not on file    Inability: Not on file  . Transportation needs:    Medical: Not on file    Non-medical: Not on file  Tobacco Use  . Smoking status: Former Smoker    Packs/day: 1.00    Years: 10.00    Pack years: 10.00  . Smokeless tobacco: Never  Used  . Tobacco comment: quit 57 yrs old  Substance and Sexual Activity  . Alcohol use: Yes    Alcohol/week: 7.0 standard drinks    Types: 7 Glasses of wine per week  . Drug use: No  . Sexual activity: Yes    Birth control/protection: Post-menopausal  Lifestyle  . Physical activity:    Days per week: Not on file    Minutes per session: Not on file  . Stress: Not on file  Relationships  . Social connections:    Talks on phone: Not on file    Gets together: Not on file    Attends religious service: Not on  file    Active member of club or organization: Not on file    Attends meetings of clubs or organizations: Not on file    Relationship status: Not on file  . Intimate partner violence:    Fear of current or ex partner: Not on file    Emotionally abused: Not on file    Physically abused: Not on file    Forced sexual activity: Not on file  Other Topics Concern  . Not on file  Social History Narrative   Ms. Laidlaw lives with her husband and daughter. She is originally from [place]-moved to [place]. She relocated to [facility] about [place] from [place].   Allergies as of 06/03/2018      Reactions   Hydrocodone-acetaminophen Hives      Medication List        Accurate as of 06/03/18 12:44 PM. Always use your most recent med list.          atorvastatin 80 MG tablet Commonly known as:  LIPITOR Take 1 tablet (80 mg total) by mouth daily.   b complex vitamins capsule Take 1 capsule by mouth daily.   Cholecalciferol 1000 units tablet Take 3,000 Units by mouth daily.   fexofenadine 180 MG tablet Commonly known as:  ALLEGRA Take 180 mg by mouth daily.   fluticasone 50 MCG/ACT nasal spray Commonly known as:  FLONASE Place 2 sprays into both nostrils daily.   LORazepam 1 MG tablet Commonly known as:  ATIVAN Take 1 tablet (1 mg total) by mouth 2 (two) times daily as needed for anxiety.   Magnesium 250 MG Tabs Take 1 tablet by mouth.       All past medical history, surgical history, allergies, family history, immunizations andmedications were updated in the EMR today and reviewed under the history and medication portions of their EMR.     No results found for this or any previous visit (from the past 2160 hour(s)).  Ct Abdomen Wo Contrast Result Date: 01/14/2018 IMPRESSION: 1. Stable 19 mm right adrenal gland adenoma. No further imaging evaluation or follow-up is necessary. 2. Status post cholecystectomy.  No biliary dilatation. 3. No acute abdominal findings or adenopathy.  Electronically Signed   By: Marijo Sanes M.D.   On: 01/14/2018 16:38    ROS: 14 pt review of systems performed and negative (unless mentioned in an HPI)  Objective: BP 134/82 (BP Location: Right Arm, Patient Position: Sitting, Cuff Size: Normal)   Pulse 66   Temp 98.1 F (36.7 C)   Resp 20   Ht '5\' 4"'$  (1.626 m)   Wt 153 lb (69.4 kg)   SpO2 98%   BMI 26.26 kg/m  Gen: Afebrile. No acute distress. Nontoxic in appearance, well-developed, well-nourished, pleasant Caucasian female, overweight. HENT: AT. [place]. Bilateral TM visualized and normal in appearance,  normal external auditory canal. MMM, no oral lesions, adequate dentition. Bilateral nares within normal limits. Throat without erythema, ulcerations or exudates.  No cough on exam, no hoarseness on exam. Eyes:Pupils Equal Round Reactive to light, Extraocular movements intact,  Conjunctiva without redness, discharge or icterus. Neck/lymp/endocrine: Supple, no lymphadenopathy, no thyromegaly CV: RRR no murmur, no edema, +2/4 P posterior tibialis pulses.  No carotid bruits. No JVD. Chest: CTAB, no wheeze, rhonchi or crackles.  Normal respiratory effort.  Good air movement. Abd: Soft.  Flat. NTND. BS present.  No masses palpated. No hepatosplenomegaly. No rebound tenderness or guarding. Skin: No rashes, purpura or petechiae. Warm and well-perfused. Skin intact. Neuro/Msk:  Normal gait. PERLA. EOMi. Alert. Oriented x3.  Cranial nerves II through XII intact. Muscle strength 5/5 upper/lower extremity. DTRs equal bilaterally. Psych: Normal affect, dress and demeanor. Normal speech. Normal thought content and judgment.  No exam data present  Assessment/plan: Mikayla Tucker is a 57 y.o. female present for CPE. Immunization due - Flu Vaccine QUAD 6+ mos PF IM (Fluarix Quad PF) - Varicella-zoster vaccine IM (Shingrix) Elevated LFTs - Comp Met (CMET) Dyslipidemia Continue atorvastatin - Lipid panel - TSH - atorvastatin (LIPITOR) 80 MG tablet;  Take 1 tablet (80 mg total) by mouth daily.  Dispense: 90 tablet; Refill: 3 Vitamin D deficiency - Vitamin D (25 hydroxy) Prediabetes/Overweight (BMI 25.0-29.9) Routine exercise and dietary changes recommended - HgB A1c Macrocytosis without anemia -Hematology referral placed in the past, she did not schedule.  If macrocytosis is still present despite avoidance of potential causes and replacement of B vitamins will refer again to hematology.  Patient is in understanding of plan. - CBC w/Diff Encounter for preventive health examination Patient was encouraged to exercise greater than 150 minutes a week. Patient was encouraged to choose a diet filled with fresh fruits and vegetables, and lean meats. AVS provided to patient today for education/recommendation on gender specific health and safety maintenance. Colonoscopy: Completed 07/04/2016, 5 year recall. One colon polyp. Family history unknown (adopted). Completed by Dr. Carlean Purl. Mammogram: Completed 02/12/2017, patient reports normal. Completed at [facility] Report requested AGAIN Cervical cancer screening: Com[facility]. Patient reports normal. Report requested AGAIN Immunizations: tdap UTD completed 02/27/2016, influenza updated today (encouraged yearly), Shingrix #1 provided today Infectious disease screening: HIV and hepatitis C completed  Return in about 1 year (around 06/04/2019) for CPE. Nurse visit in 3 months Shingrix No. 2  Electronically signed by: Howard Pouch, [place]

## 2018-06-04 ENCOUNTER — Telehealth: Payer: Self-pay | Admitting: Family Medicine

## 2018-06-04 DIAGNOSIS — D7589 Other specified diseases of blood and blood-forming organs: Secondary | ICD-10-CM

## 2018-06-04 MED ORDER — EZETIMIBE 10 MG PO TABS: 10.0000 mg | ORAL_TABLET | Freq: Every day | ORAL | 3 refills | Status: DC

## 2018-06-04 NOTE — Telephone Encounter (Signed)
Please inform patient the following information: Her red blood cells are still larger than normal (106-->105.1). I have place the referral to hematologist again.  Her vit D, thyroid, kidneys, liver and diabetes screen are normal.   Her LDL (bad cholesterol)  is still higher than desired, but the rest of her cholesterol panel looks good. Continue the atorvastatin and I would like to add a med called Zetia that can help bring down the LDL as well.    *Please make certain we get her PAP and mammogram results from her GYN, she reports having this year.

## 2018-06-04 NOTE — Telephone Encounter (Signed)
Spoke with patient reviewed lab results and instructions. Patient verbalized understanding. Health screening form placed at front desk for patient.

## 2018-06-17 ENCOUNTER — Telehealth: Payer: Self-pay | Admitting: Family Medicine

## 2018-06-17 NOTE — Telephone Encounter (Signed)
Left detailed message with information and instructions on patient voice mail per DPR. Patient instructed to call back to schedule 6 month follow up appointment.

## 2018-06-17 NOTE — Telephone Encounter (Signed)
Please call pt: I have discussed her macrocytosis (large red blood cell) with the blood specialist and he reviewed her labs. He recommends she continue to have surveillance here, since we did the labs to investigate cause already and they were normal.  Follow up in 6 mos for provider appt, and we will rpt CBC and refer if other cell types become affected.

## 2018-07-12 ENCOUNTER — Telehealth: Payer: Self-pay | Admitting: Family Medicine

## 2018-07-12 NOTE — Telephone Encounter (Signed)
Please see phone note on 06/17/2018. This has been addressed by phone note at that time.

## 2018-07-12 NOTE — Telephone Encounter (Signed)
Per referral note routed to pcp on 06/14/17, Dr. Marin Olp has decline to accept referral.  Please advise next steps since referral has been denied.   Copied from [place] [id number]. Topic: Referral - Status >> Jul 09, 2018  3:39 PM Yvette Rack wrote: Reason for CRM: Pt states was suppose to receive a call from either a Hematologist or Oncologist but she has not heard from anyone. Pt requests call back. >> Jul 09, 2018  3:52 PM Ralph Dowdy, CMA wrote: Can you check on this please?

## 2018-07-13 NOTE — Telephone Encounter (Signed)
Left message on patient's voicemail advising her of information from pcp as noted in phone note dated 06/17/18.

## 2018-07-13 NOTE — Telephone Encounter (Signed)
Left message reviewing information from 06/17/18 let patient know I had left information on her voice mail prior to this left information again. Instructed patient to call back if any questions.

## 2018-07-19 ENCOUNTER — Ambulatory Visit: Payer: Self-pay

## 2018-07-19 NOTE — Telephone Encounter (Signed)
Pt. C/o right arm pain x 4 weeks. Pain comes and goes, but it is getting worse. States it is waking her up at night. The pain is in her bicep area. Does not recall any kind of injury. Has noticed some numbness that comes and goes. Appointment made for tomorrow. Reason for Disposition . Numbness (i.e., loss of sensation) in hand or fingers  Answer Assessment - Initial Assessment Questions 1. ONSET: "When did the pain start?"     Started 4 weeks ago 2. LOCATION: "Where is the pain located?"     Right bicep 3. PAIN: "How bad is the pain?" (Scale 1-10; or mild, moderate, severe)   - MILD (1-3): doesn't interfere with normal activities   - MODERATE (4-7): interferes with normal activities (e.g., work or school) or awakens from sleep   - SEVERE (8-10): excruciating pain, unable to do any normal activities, unable to hold a cup of water     Intermittent  8 4. WORK OR EXERCISE: "Has there been any recent work or exercise that involved this part of the body?"     No 5. CAUSE: "What do you think is causing the arm pain?"     Unsure 6. OTHER SYMPTOMS: "Do you have any other symptoms?" (e.g., neck pain, swelling, rash, fever, numbness, weakness)     Some numbness - comes and goes 7. PREGNANCY: "Is there any chance you are pregnant?" "When was your last menstrual period?"     No  Protocols used: ARM PAIN-A-AH

## 2018-07-20 ENCOUNTER — Ambulatory Visit: Payer: Commercial Managed Care - PPO | Admitting: Family Medicine

## 2018-07-20 ENCOUNTER — Encounter: Payer: Self-pay | Admitting: Family Medicine

## 2018-07-20 VITALS — BP 136/79 | HR 79 | Temp 98.3°F | Resp 20 | Ht 64.0 in | Wt 153.0 lb

## 2018-07-20 DIAGNOSIS — M7581 Other shoulder lesions, right shoulder: Secondary | ICD-10-CM

## 2018-07-20 MED ORDER — NAPROXEN 500 MG PO TABS: 500.0000 mg | ORAL_TABLET | Freq: Two times a day (BID) | ORAL | 0 refills | Status: DC

## 2018-07-20 NOTE — Patient Instructions (Addendum)
Start naproxen every 12 hours with food for 7 days.  Then slowly back in to your routine. If pain still occurs will need to consider Sports med or orthopedic.   Rotator Cuff Tendinitis Rotator cuff tendinitis is inflammation of the tough, cord-like bands that connect muscle to bone (tendons) in the rotator cuff. The rotator cuff includes all of the muscles and tendons that connect the arm to the shoulder. The rotator cuff holds the head of the upper arm bone (humerus) in the cup (fossa) of the shoulder blade (scapula). This condition can lead to a long-lasting (chronic) tear. The tear may be partial or complete. What are the causes? This condition is usually caused by overusing the rotator cuff. What increases the risk? This condition is more likely to develop in athletes and workers who frequently use their shoulder or reach over their heads. This can include activities such as:  Tennis.  Baseball or softball.  Swimming.  Construction work.  Painting.  What are the signs or symptoms? Symptoms of this condition include:  Pain spreading (radiating) from the shoulder to the upper arm.  Swelling and tenderness in front of the shoulder.  Pain when reaching, pulling, or lifting the arm above the head.  Pain when lowering the arm from above the head.  Minor pain in the shoulder when resting.  Increased pain in the shoulder at night.  Difficulty placing the arm behind the back.  How is this diagnosed? This condition is diagnosed with a medical history and physical exam. Tests may also be done, including:  X-rays.  MRI.  Ultrasounds.  CT or MR arthrogram. During this test, a contrast material is injected and then images are taken.  How is this treated? Treatment for this condition depends on the severity of the condition. In less severe cases, treatment may include:  Rest. This may be done with a sling that holds the shoulder still (immobilization). Your health care  provider may also recommend avoiding activities that involve lifting your arm over your head.  Icing the shoulder.  Anti-inflammatory medicines, such as aspirin or ibuprofen.  In more severe cases, treatment may include:  Physical therapy.  Steroid injections.  Surgery.  Follow these instructions at home: If you have a sling:  Wear the sling as told by your health care provider. Remove it only as told by your health care provider.  Loosen the sling if your fingers tingle, become numb, or turn cold and blue.  Keep the sling clean.  If the sling is not waterproof, do not let it get wet. Remove it, if allowed, or cover it with a watertight covering when you take a bath or shower. Managing pain, stiffness, and swelling  If directed, put ice on the injured area. ? If you have a removable sling, remove it as told by your health care provider. ? Put ice in a plastic bag. ? Place a towel between your skin and the bag. ? Leave the ice on for 20 minutes, 2-3 times a day.  Move your fingers often to avoid stiffness and to lessen swelling.  Raise (elevate) the injured area above the level of your heart while you are lying down.  Find a comfortable sleeping position or sleep on a recliner, if available. Driving  Do not drive or use heavy machinery while taking prescription pain medicine.  Ask your health care provider when it is safe to drive if you have a sling on your arm. Activity  Rest your shoulder as told  by your health care provider.  Return to your normal activities as told by your health care provider. Ask your health care provider what activities are safe for you.  Do any exercises or stretches as told by your health care provider.  If you do repetitive overhead tasks, take small breaks in between and include stretching exercises as told by your health care provider. General instructions  Do not use any products that contain nicotine or tobacco, such as cigarettes  and e-cigarettes. These can delay healing. If you need help quitting, ask your health care provider.  Take over-the-counter and prescription medicines only as told by your health care provider.  Keep all follow-up visits as told by your health care provider. This is important. Contact a health care provider if:  Your pain gets worse.  You have new pain in your arm, hands, or fingers.  Your pain is not relieved with medicine or does not get better after 6 weeks of treatment.  You have cracking sensations when moving your shoulder in certain directions.  You hear a snapping sound after using your shoulder, followed by severe pain and weakness. Get help right away if:  Your arm, hand, or fingers are numb or tingling.  Your arm, hand, or fingers are swollen or painful or they turn white or blue. Summary  Rotator cuff tendinitis is inflammation of the tough, cord-like bands that connect muscle to bone (tendons) in the rotator cuff.  This condition is usually caused by overusing the rotator cuff, which includes all of the muscles and tendons that connect the arm to the shoulder.  This condition is more likely to develop in athletes and workers who frequently use their shoulder or reach over their heads.  Treatment generally includes rest, anti-inflammatory medicines, and icing. In some cases, physical therapy and steroid injections may be needed. In severe cases, surgery may be needed. This information is not intended to replace advice given to you by your health care provider. Make sure you discuss any questions you have with your health care provider. Document Released: 12/13/2003 Document Revised: 09/08/2016 Document Reviewed: 09/08/2016 Elsevier Interactive Patient Education  2017 [employer].   Shoulder Impingement Syndrome Shoulder impingement syndrome is a condition that causes pain when connective tissues (tendons) surrounding the shoulder joint become pinched. These tendons  are part of the group of muscles and tissues that help to stabilize the shoulder (rotator cuff). Beneath the rotator cuff is a fluid-filled sac (bursa) that allows the muscles and tendons to glide smoothly. The bursa may become swollen or irritated (bursitis). Bursitis, swelling in the rotator cuff tendons, or both conditions can decrease how much space is under a bone in the shoulder joint (acromion), resulting in impingement. What are the causes? Shoulder impingement syndrome can be caused by bursitis or swelling of the rotator cuff tendons, which may result from:  Repetitive overhead arm movements.  Falling onto the shoulder.  Weakness in the shoulder muscles.  What increases the risk? You may be more likely to develop this condition if you are an athlete who participates in:  Sports that involve throwing, such as baseball.  Tennis.  Swimming.  Volleyball.  Some people are also more likely to develop impingement syndrome because of the shape of their acromion bone. What are the signs or symptoms? The main symptom of this condition is pain on the front or side of the shoulder. Pain may:  Get worse when lifting or raising the arm.  Get worse at night.  Wake  you up from sleeping.  Feel sharp when the shoulder is moved, and then fade to an ache.  Other signs and symptoms may include:  Tenderness.  Stiffness.  Inability to raise the arm above shoulder level or behind the body.  Weakness.  How is this diagnosed? This condition may be diagnosed based on:  Your symptoms.  Your medical history.  A physical exam.  Imaging tests, such as: ? X-rays. ? MRI. ? Ultrasound.  How is this treated? Treatment for this condition may include:  Resting your shoulder and avoiding all activities that cause pain or put stress on the shoulder.  Icing your shoulder.  NSAIDs to help reduce pain and swelling.  One or more injections of medicines to numb the area and reduce  inflammation.  Physical therapy.  Surgery. This may be needed if nonsurgical treatments have not helped. Surgery may involve repairing the rotator cuff, reshaping the acromion, or removing the bursa.  Follow these instructions at home: Managing pain, stiffness, and swelling  If directed, apply ice to the injured area. ? Put ice in a plastic bag. ? Place a towel between your skin and the bag. ? Leave the ice on for 20 minutes, 2-3 times a day. Activity  Rest and return to your normal activities as told by your health care provider. Ask your health care provider what activities are safe for you.  Do exercises as told by your health care provider. General instructions  Do not use any tobacco products, including cigarettes, chewing tobacco, or e-cigarettes. Tobacco can delay healing. If you need help quitting, ask your health care provider.  Ask your health care provider when it is safe for you to drive.  Take over-the-counter and prescription medicines only as told by your health care provider.  Keep all follow-up visits as told by your health care provider. This is important. How is this prevented?  Give your body time to rest between periods of activity.  Be safe and responsible while being active to avoid falls.  Maintain physical fitness, including strength and flexibility. Contact a health care provider if:  Your symptoms have not improved after 1-2 months of treatment and rest.  You cannot lift your arm away from your body. This information is not intended to replace advice given to you by your health care provider. Make sure you discuss any questions you have with your health care provider. Document Released: 09/22/2005 Document Revised: 05/29/2016 Document Reviewed: 08/25/2015 Elsevier Interactive Patient Education  [employer].

## 2018-07-20 NOTE — Progress Notes (Signed)
Mikayla Tucker , [birth year], 57 y.o., female MRN: [id number] Patient Care Team    Relationship Specialty Notifications Start End  Ma Hillock, DO PCP - General Family Medicine  08/07/15   Kennon Holter, NP Nurse Practitioner Nurse Practitioner  02/27/16    Comment: Gynecology  Gatha Mayer, MD Consulting Physician Gastroenterology  04/30/17     Chief Complaint  Patient presents with  . Arm Pain    right  deltoid x 4 weeks      Subjective: Pt presents for an OV with complaints of right deltoid pain  of 4 weeks duration.  Associated symptoms include pain that is intermittent deep throbbing in the right deltoid area.  She noticed that he pain can be reproduced when drawing in fine detail.  He tried over-the-counter ibuprofen and it was not helpful.  She tried Aleve and it was better but not resolved.  She states the pain became worse over the weekend.  She denies any known injury, increase in activity or history of arthritis.   Depression screen [facility] 2/9 06/03/2018 11/03/2017 04/29/2017  Decreased Interest 0 0 0  Down, Depressed, Hopeless 0 0 0  PHQ - 2 Score 0 0 0    Allergies  Allergen Reactions  . Hydrocodone-Acetaminophen Hives   Social History   Tobacco Use  . Smoking status: Former Smoker    Packs/day: 1.00    Years: 10.00    Pack years: 10.00  . Smokeless tobacco: Never Used  . Tobacco comment: quit 57 yrs old  Substance Use Topics  . Alcohol use: Yes    Alcohol/week: 7.0 standard drinks    Types: 7 Glasses of wine per week   Past Medical History:  Diagnosis Date  . Allergy   . Hyperlipidemia   . Squamous cell skin cancer    SCC   Past Surgical History:  Procedure Laterality Date  . APPENDECTOMY    . BACK SURGERY    . BREAST SURGERY     biopsy  . CHOLECYSTECTOMY    . COLONOSCOPY    . LIPOMA EXCISION     under breast on R side   Family History  Adopted: Yes  Problem Relation Age of Onset  . Colon cancer Neg Hx    Allergies as of 07/20/2018        Reactions   Hydrocodone-acetaminophen Hives      Medication List        Accurate as of 07/20/18  1:48 PM. Always use your most recent med list.          atorvastatin 80 MG tablet Commonly known as:  LIPITOR Take 1 tablet (80 mg total) by mouth daily.   b complex vitamins capsule Take 1 capsule by mouth daily.   Cholecalciferol 1000 units tablet Take 3,000 Units by mouth daily.   ezetimibe 10 MG tablet Commonly known as:  ZETIA Take 1 tablet (10 mg total) by mouth daily.   fexofenadine 180 MG tablet Commonly known as:  ALLEGRA Take 180 mg by mouth daily.   fluticasone 50 MCG/ACT nasal spray Commonly known as:  FLONASE Place 2 sprays into both nostrils daily.   LORazepam 1 MG tablet Commonly known as:  ATIVAN Take 1 tablet (1 mg total) by mouth 2 (two) times daily as needed for anxiety.   Magnesium 250 MG Tabs Take 1 tablet by mouth.       All past medical history, surgical history, allergies, family history, immunizations andmedications were updated in the  EMR today and reviewed under the history and medication portions of their EMR.     ROS: Negative, with the exception of above mentioned in HPI   Objective:  BP 136/79 (BP Location: Left Arm, Patient Position: Sitting, Cuff Size: Normal)   Pulse 79   Temp 98.3 F (36.8 C)   Resp 20   Ht 5\' 4"  (1.626 m)   Wt 153 lb (69.4 kg)   SpO2 97%   BMI 26.26 kg/m  Body mass index is 26.26 kg/m. Gen: Afebrile. No acute distress. Nontoxic in appearance, well developed, well nourished.  MSK: Right upper extremity without erythema or soft tissue swelling.  Mild tenderness to palpation right deltoid.  Full range of motion with abduction, mild scapula lag right.  Normal cervical range of motion with mild discomfort on right side bending.  Negative tenderness bicipital groove.  Positive Hawkins, positive liftoff, positive empty can test for discomfort and mild weakness.  Neurovascularly intact distally. Skin: no  rashes, purpura or petechiae.  Neuro:  Normal gait. PERLA. EOMi. Alert. Oriented x3   No exam data present No results found. No results found for this or any previous visit (from the past 24 hour(s)).  Assessment/Plan: Mikayla Tucker is a 57 y.o. female present for OV for  Rotator cuff tendinitis, right Sam is consistent with rotator cuff tendinitis or injury.  She does crawl, performing fine detailed movements which does seem to a exacerbate her symptoms.  Discussed options with her today and she is agreeable to naproxen twice daily x7 days with meals.  Attempt to rest shoulder joint.  For 7 days slowly attempt to return to her routine.  If comfort is still present at that time will need to discuss referral to either sports medicine or orthopedics for further evaluation.  Reviewed expectations re: course of current medical issues.  Discussed self-management of symptoms.  Outlined signs and symptoms indicating need for more acute intervention.  Patient verbalized understanding and all questions were answered.  Patient received an After-Visit Summary.    No orders of the defined types were placed in this encounter.    Note is dictated utilizing voice recognition software. Although note has been proof read prior to signing, occasional typographical errors still can be missed. If any questions arise, please do not hesitate to call for verification.   electronically signed by:  Howard Pouch, DO  [place]

## 2018-07-22 ENCOUNTER — Encounter: Payer: Self-pay | Admitting: Family Medicine

## 2018-07-22 DIAGNOSIS — M7581 Other shoulder lesions, right shoulder: Secondary | ICD-10-CM

## 2018-07-22 HISTORY — DX: Other shoulder lesions, right shoulder: M75.81

## 2018-08-18 ENCOUNTER — Ambulatory Visit: Payer: Commercial Managed Care - PPO

## 2018-08-20 ENCOUNTER — Ambulatory Visit (INDEPENDENT_AMBULATORY_CARE_PROVIDER_SITE_OTHER): Payer: Commercial Managed Care - PPO

## 2018-08-20 DIAGNOSIS — Z23 Encounter for immunization: Secondary | ICD-10-CM | POA: Diagnosis not present

## 2018-08-20 NOTE — Progress Notes (Addendum)
Anaise Sterbenz is a 57 y.o. female presents to the office today for #2 shingrix injections, per physician's orders. Original order: 06/03/18, Shingrix 0.62ml, IM, was administered Right Deltoid today. Patient tolerated injection. Patient due for follow up labs/provider appt: No. Date due: , appt made No Patient next injection due: , appt made No  [place] screening examination/treatment/procedure(s) were performed by non-physician practitioner and as supervising physician I was immediately available for consultation/collaboration.  I agree with above assessment and plan.  Electronically Signed by: Howard Pouch, DO [place] [facility] [place]

## 2018-08-23 ENCOUNTER — Ambulatory Visit: Payer: Commercial Managed Care - PPO | Admitting: Family Medicine

## 2018-08-23 ENCOUNTER — Encounter: Payer: Self-pay | Admitting: Family Medicine

## 2018-08-23 VITALS — BP 121/84 | HR 77 | Temp 97.6°F | Resp 16 | Ht 64.0 in | Wt 154.0 lb

## 2018-08-23 DIAGNOSIS — R591 Generalized enlarged lymph nodes: Secondary | ICD-10-CM

## 2018-08-23 MED ORDER — METHYLPREDNISOLONE ACETATE 80 MG/ML IJ SUSP
80.0000 mg | Freq: Once | INTRAMUSCULAR | Status: AC
  Administered 2018-08-23: 80 mg via INTRAMUSCULAR

## 2018-08-23 NOTE — Progress Notes (Signed)
Mikayla Tucker , 04-07-1961, 57 y.o., female MRN: [id number] Patient Care Team    Relationship Specialty Notifications Start End  Ma Hillock, DO PCP - General Family Medicine  08/07/15   Kennon Holter, NP Nurse Practitioner Nurse Practitioner  02/27/16    Comment: Gynecology  Gatha Mayer, MD Consulting Physician Gastroenterology  04/30/17     Chief Complaint  Patient presents with  . Breast Mass    chest area     Subjective: Pt presents for an OV with complaints of tender mass above left collar bone of 2 days duration.  Associated symptoms include fever, fatigue the day after shingrix #2 in the left arm on Saturday. The next morning she noticed a tender swollen area above left clavicle. She denies continued fever, chills or fatigue. She denies night sweats.  Reported MAM 02/03/2018- no records.  Pt has tried nothing to ease their symptoms.   Depression screen [facility] 2/9 06/03/2018 11/03/2017 04/29/2017  Decreased Interest 0 0 0  Down, Depressed, Hopeless 0 0 0  PHQ - 2 Score 0 0 0    Allergies  Allergen Reactions  . Hydrocodone-Acetaminophen Hives   Social History   Tobacco Use  . Smoking status: Former Smoker    Packs/day: 1.00    Years: 10.00    Pack years: 10.00  . Smokeless tobacco: Never Used  . Tobacco comment: quit 57 yrs old  Substance Use Topics  . Alcohol use: Yes    Alcohol/week: 7.0 standard drinks    Types: 7 Glasses of wine per week   Past Medical History:  Diagnosis Date  . Allergy   . Hyperlipidemia   . Squamous cell skin cancer    SCC   Past Surgical History:  Procedure Laterality Date  . APPENDECTOMY    . BACK SURGERY    . BREAST SURGERY     biopsy  . CHOLECYSTECTOMY    . COLONOSCOPY    . LIPOMA EXCISION     under breast on R side   Family History  Adopted: Yes  Problem Relation Age of Onset  . Colon cancer Neg Hx    Allergies as of 08/23/2018      Reactions   Hydrocodone-acetaminophen Hives      Medication List        Accurate as of 08/23/18  9:58 AM. Always use your most recent med list.          atorvastatin 80 MG tablet Commonly known as:  LIPITOR Take 1 tablet (80 mg total) by mouth daily.   b complex vitamins capsule Take 1 capsule by mouth daily.   Cholecalciferol 25 MCG (1000 UT) tablet Take 3,000 Units by mouth daily.   ezetimibe 10 MG tablet Commonly known as:  ZETIA Take 1 tablet (10 mg total) by mouth daily.   fexofenadine 180 MG tablet Commonly known as:  ALLEGRA Take 180 mg by mouth daily.   fluticasone 50 MCG/ACT nasal spray Commonly known as:  FLONASE Place 2 sprays into both nostrils daily.   LORazepam 1 MG tablet Commonly known as:  ATIVAN Take 1 tablet (1 mg total) by mouth 2 (two) times daily as needed for anxiety.   Magnesium 250 MG Tabs Take 1 tablet by mouth.   naproxen 500 MG tablet Commonly known as:  NAPROSYN Take 1 tablet (500 mg total) by mouth 2 (two) times daily with a meal.       All past medical history, surgical history, allergies, family history, immunizations  andmedications were updated in the EMR today and reviewed under the history and medication portions of their EMR.     ROS: Negative, with the exception of above mentioned in HPI   Objective:  BP 121/84 (BP Location: Left Arm, Patient Position: Sitting, Cuff Size: Normal)   Pulse 77   Temp 97.6 F (36.4 C) (Oral)   Resp 16   Ht 5\' 4"  (1.626 m)   Wt 154 lb (69.9 kg)   SpO2 97%   BMI 26.43 kg/m  Body mass index is 26.43 kg/m. Gen: Afebrile. No acute distress. Nontoxic in appearance, well developed, well nourished.  HENT: AT. [place].  MMM, no oral lesions. Bilateral nares WL. Throat without erythema or exudates. No cough or hoarseness Eyes:Pupils Equal Round Reactive to light, Extraocular movements intact,  Conjunctiva without redness, discharge or icterus. Neck/lymp/endocrine: Supple, 2cm x 1cm oval tender mobile mass above left clavicle. No axillary  Lymphadenopathy.  CV: RRR  Skin:  no rashes, purpura or petechiae.  Neuro:  Normal gait. PERLA. EOMi. Alert. Oriented x3  No exam data present No results found. No results found for this or any previous visit (from the past 24 hour(s)).  Assessment/Plan: [name] is a 57 y.o. female present for OV for  Lymphadenopathy Possible reaction to shingrix #2 provided 2 days prior to symptoms. She currently has no other symptoms to suspect acute viral illness. She reports her mam was 02/2018 and normal. We will request record from her GYN.  - steroid injection today. - OTC advil/naproxen for pain - methylPREDNISolone acetate (DEPO-MEDROL) injection 80 mg - if lymph node is not completely resolved in 2 weeks, followup and image with be obtained.    Reviewed expectations re: course of current medical issues.  Discussed self-management of symptoms.  Outlined signs and symptoms indicating need for more acute intervention.  Patient verbalized understanding and all questions were answered.  Patient received an After-Visit Summary.    No orders of the defined types were placed in this encounter.    Note is dictated utilizing voice recognition software. Although note has been proof read prior to signing, occasional typographical errors still can be missed. If any questions arise, please do not hesitate to call for verification.   electronically signed by:  Howard Pouch, DO  [place]

## 2018-08-23 NOTE — Patient Instructions (Signed)
Steroid shot today. You can use advil or aleve for comfort. If not resolved by 2 weeks, then we want to get an image and make certain mammogram is UTD.   This may be from a lymph node over reacting to the shingrix, but need to make certain.

## 2018-08-26 ENCOUNTER — Encounter: Payer: Self-pay | Admitting: Family Medicine

## 2018-11-21 ENCOUNTER — Other Ambulatory Visit: Payer: Self-pay | Admitting: Neurology

## 2018-11-25 ENCOUNTER — Emergency Department (HOSPITAL_BASED_OUTPATIENT_CLINIC_OR_DEPARTMENT_OTHER)
Admission: EM | Admit: 2018-11-25 | Discharge: 2018-11-25 | Disposition: A | Discharge status: Home / Self Care | Payer: Commercial Managed Care - PPO | Attending: Emergency Medicine | Admitting: Emergency Medicine

## 2018-11-25 ENCOUNTER — Encounter (HOSPITAL_BASED_OUTPATIENT_CLINIC_OR_DEPARTMENT_OTHER): Payer: Self-pay | Admitting: Emergency Medicine

## 2018-11-25 ENCOUNTER — Emergency Department (HOSPITAL_BASED_OUTPATIENT_CLINIC_OR_DEPARTMENT_OTHER): Payer: Commercial Managed Care - PPO

## 2018-11-25 ENCOUNTER — Other Ambulatory Visit: Payer: Self-pay

## 2018-11-25 ENCOUNTER — Ambulatory Visit: Payer: Self-pay | Admitting: *Deleted

## 2018-11-25 DIAGNOSIS — R109 Unspecified abdominal pain: Secondary | ICD-10-CM | POA: Diagnosis not present

## 2018-11-25 DIAGNOSIS — R195 Other fecal abnormalities: Secondary | ICD-10-CM | POA: Diagnosis present

## 2018-11-25 DIAGNOSIS — R197 Diarrhea, unspecified: Secondary | ICD-10-CM | POA: Insufficient documentation

## 2018-11-25 DIAGNOSIS — Z87891 Personal history of nicotine dependence: Secondary | ICD-10-CM | POA: Diagnosis not present

## 2018-11-25 DIAGNOSIS — A09 Infectious gastroenteritis and colitis, unspecified: Secondary | ICD-10-CM | POA: Diagnosis not present

## 2018-11-25 DIAGNOSIS — Z85828 Personal history of other malignant neoplasm of skin: Secondary | ICD-10-CM | POA: Insufficient documentation

## 2018-11-25 LAB — CBC
HCT: 48.5 % — ABNORMAL HIGH (ref 36.0–46.0)
Hemoglobin: 15.6 g/dL — ABNORMAL HIGH (ref 12.0–15.0)
MCH: 34.3 pg — ABNORMAL HIGH (ref 26.0–34.0)
MCHC: 32.2 g/dL (ref 30.0–36.0)
MCV: 106.6 fL — ABNORMAL HIGH (ref 80.0–100.0)
PLATELETS: 283 10*3/uL (ref 150–400)
RBC: 4.55 MIL/uL (ref 3.87–5.11)
RDW: 12.6 % (ref 11.5–15.5)
WBC: 4.9 10*3/uL (ref 4.0–10.5)
nRBC: 0 % (ref 0.0–0.2)

## 2018-11-25 LAB — COMPREHENSIVE METABOLIC PANEL
ALT: 60 U/L — ABNORMAL HIGH (ref 0–44)
AST: 53 U/L — ABNORMAL HIGH (ref 15–41)
Albumin: 4.8 g/dL (ref 3.5–5.0)
Alkaline Phosphatase: 94 U/L (ref 38–126)
Anion gap: 10 (ref 5–15)
BILIRUBIN TOTAL: 0.6 mg/dL (ref 0.3–1.2)
BUN: 9 mg/dL (ref 6–20)
CO2: 24 mmol/L (ref 22–32)
Calcium: 9.8 mg/dL (ref 8.9–10.3)
Chloride: 101 mmol/L (ref 98–111)
Creatinine, Ser: 0.65 mg/dL (ref 0.44–1.00)
GFR calc Af Amer: >60 mL/min (ref >60)
GFR calc non Af Amer: >60 mL/min (ref >60)
Glucose, Bld: 111 mg/dL — ABNORMAL HIGH (ref 70–99)
Potassium: 3.7 mmol/L (ref 3.5–5.1)
Sodium: 135 mmol/L (ref 135–145)
TOTAL PROTEIN: 8.5 g/dL — AB (ref 6.5–8.1)

## 2018-11-25 LAB — OCCULT BLOOD X 1 CARD TO LAB, STOOL: FECAL OCCULT BLD: NEGATIVE

## 2018-11-25 LAB — LIPASE, BLOOD: Lipase: 35 U/L (ref 11–51)

## 2018-11-25 MED ORDER — IOPAMIDOL (ISOVUE-300) INJECTION 61%
100.0000 mL | Freq: Once | INTRAVENOUS | Status: AC | PRN
  Administered 2018-11-25: 100 mL via INTRAVENOUS

## 2018-11-25 MED ORDER — LOPERAMIDE HCL 2 MG PO CAPS: 2.0000 mg | ORAL_CAPSULE | Freq: Four times a day (QID) | ORAL | 0 refills | Status: DC | PRN

## 2018-11-25 MED ORDER — SODIUM CHLORIDE 0.9 % IV BOLUS
1000.0000 mL | Freq: Once | INTRAVENOUS | Status: AC
  Administered 2018-11-25: 1000 mL via INTRAVENOUS

## 2018-11-25 MED FILL — SM ANTI-DIARRHEAL 2 MG CAPL: 2 | 12 days supply | Qty: 48 | Fill #0

## 2018-11-25 NOTE — Telephone Encounter (Signed)
Called patient to advise her ER would be best care and when I called she was already at the [facility] checking in.

## 2018-11-25 NOTE — ED Triage Notes (Signed)
Pt reports diarrhea x 3 days , bloody stools this morning , last colonoscopy last year , normal colon. Denies abd pain nor nausea .

## 2018-11-25 NOTE — Discharge Instructions (Signed)
You were seen in the ED today with diarrhea. Drink plenty of fluids and follow up with your PCP in the coming days. Return to the ED if you develop severe abdominal pain, worsening diarrhea, fever, vomiting, or other concerning symptoms.

## 2018-11-25 NOTE — Telephone Encounter (Signed)
Pt called with complaints of lquid diarrhea since 11/23/2018; today she noticed blood in her stool (stool looks like it had tissue from her bowel in it); the pt also says that her diarrhea looks like tomato sauceshe had 3 episodes on 11/25/2018; nurse triage initiated and recommendations made per protocol; she verbalizes understanding; the pt also request that her information be sent to the office in case she can be seen there instead; she can be contacted at  [phone] (cell); pt normally seen by Dr Raoul Pitch at [facility]; will route to office for notification. Reason for Disposition . [1] MODERATE rectal bleeding (small blood clots, passing blood without stool, or toilet water turns red) AND [2] more than once a day  Answer Assessment - Initial Assessment Questions 1. APPEARANCE of BLOOD: "What color is it?" "Is it passed separately, on the surface of the stool, or mixed in with the stool?"     Mixed with stool 2. AMOUNT: "How much blood was passed?"      Unable to quantifiy 3. FREQUENCY: "How many times has blood been passed with the stools?"      3 4. ONSET: "When was the blood first seen in the stools?" (Days or weeks)      11/25/2018 at 0515 5. DIARRHEA: "Is there also some diarrhea?" If so, ask: "How many diarrhea stools were passed in past 24 hours?"      Yes 30 times in the past 24 hours 6. CONSTIPATION: "Do you have constipation?" If so, "How bad is it?"     no 7. RECURRENT SYMPTOMS: "Have you had blood in your stools before?" If so, ask: "When was the last time?" and "What happened that time?"      Yes when had ruptured polyp 20 years ago 8. BLOOD THINNERS: "Do you take any blood thinners?" (e.g., Coumadin/warfarin, Pradaxa/dabigatran, aspirin)   no 9. OTHER SYMPTOMS: "Do you have any other symptoms?"  (e.g., abdominal pain, vomiting, dizziness, fever)     Diarrhea since 12/03/2018 10. PREGNANCY: "Is there any chance you are pregnant?" "When was your last menstrual period?"  No  Protocols used: RECTAL BLEEDING-A-AH

## 2018-11-25 NOTE — ED Provider Notes (Signed)
Emergency Department Provider Note   I have reviewed the triage vital signs and the nursing notes.   HISTORY  Chief Complaint Diarrhea (bloody stool)   HPI Arzella Rehmann is a 58 y.o. female with PMH of HLD presents to the emergency department with diarrhea with concern for possible blood in the stool.  Patient has had several days of symptoms which worsened yesterday morning and seem to be improving overall.  Patient denies any fevers.  She initially had some abdominal cramping but this has resolved.  No rectal pain.  No prior history of gastrointestinal bleeding.  Patient is not anticoagulated.  No recent travel.  No antibiotics.  She began noticing dark red material in the stool this morning which was new despite her improved diarrhea frequency and overall symptoms.  No radiation of symptoms or modifying factors.  Past Medical History:  Diagnosis Date  . Allergy   . Hyperlipidemia   . Squamous cell skin cancer    SCC    Patient Active Problem List   Diagnosis Date Noted  . Rotator cuff tendinitis, right 07/22/2018  . Adrenal mass ([place]) 08/06/2017  . Long term prescription benzodiazepine use 04/30/2017  . Macrocytosis without anemia 02/28/2016  . Encounter for preventive health examination 02/27/2016  . Overweight (BMI 25.0-29.9) 02/27/2016  . BPPV (benign paroxysmal positional vertigo) 08/07/2015  . Anxiety state 05/25/2015  . Prediabetes 11/30/2014  . Vitamin D deficiency 11/30/2014  . Lipoma of back 06/14/2014  . Dyslipidemia 06/13/2014  . History of adenomatous polyp of colon 06/13/2014    Past Surgical History:  Procedure Laterality Date  . APPENDECTOMY    . BACK SURGERY    . BREAST SURGERY     biopsy  . CHOLECYSTECTOMY    . COLONOSCOPY    . LIPOMA EXCISION     under breast on R side   Allergies Hydrocodone-acetaminophen  Family History  Adopted: Yes  Problem Relation Age of Onset  . Colon cancer Neg Hx     Social History Social History   Tobacco  Use  . Smoking status: Former Smoker    Packs/day: 1.00    Years: 10.00    Pack years: 10.00  . Smokeless tobacco: Never Used  . Tobacco comment: quit 58 yrs old  Substance Use Topics  . Alcohol use: Yes    Alcohol/week: 7.0 standard drinks    Types: 7 Glasses of wine per week  . Drug use: No    Review of Systems  Constitutional: No fever/chills Eyes: No visual changes. ENT: No sore throat. Cardiovascular: Denies chest pain. Respiratory: Denies shortness of breath. Gastrointestinal: No abdominal pain.  No nausea, no vomiting. Positive bloody diarrhea.  No constipation. Genitourinary: Negative for dysuria. Musculoskeletal: Negative for back pain. Skin: Negative for rash. Neurological: Negative for headaches, focal weakness or numbness.  10-point ROS otherwise negative.  ____________________________________________   PHYSICAL EXAM:  VITAL SIGNS: ED Triage Vitals  Enc Vitals Group     BP 11/25/18 0838 (!) 124/94     Pulse Rate 11/25/18 0838 84     Resp 11/25/18 0838 18     Temp 11/25/18 0838 97.8 F (36.6 C)     Temp src --      SpO2 11/25/18 0838 98 %     Weight 11/25/18 0839 145 lb (65.8 kg)     Height 11/25/18 0839 5\' 4"  (1.626 m)   Constitutional: Alert and oriented. Well appearing and in no acute distress. Eyes: Conjunctivae are normal.  Head: Atraumatic. Nose:  No congestion/rhinnorhea. Mouth/Throat: Mucous membranes are moist.  Neck: No stridor.  Cardiovascular: Normal rate, regular rhythm. Good peripheral circulation. Grossly normal heart sounds.   Respiratory: Normal respiratory effort.  No retractions. Lungs CTAB. Gastrointestinal: Soft and nontender. No distention.  Rectal exam performed after obtaining verbal consent and with chaperone present.  Patient has no external hemorrhoids or visible fissures.  Digital rectal exam without gross blood or melena.  Hemoccult negative. Musculoskeletal: No lower extremity tenderness nor edema. No gross deformities of  extremities. Neurologic:  Normal speech and language. No gross focal neurologic deficits are appreciated.  Skin:  Skin is warm, dry and intact. No rash noted. ____________________________________________   LABS (all labs ordered are listed, but only abnormal results are displayed)  Labs Reviewed  COMPREHENSIVE METABOLIC PANEL - Abnormal; Notable for the following components:      Result Value   Glucose, Bld 111 (*)    Total Protein 8.5 (*)    AST 53 (*)    ALT 60 (*)    All other components within normal limits  CBC - Abnormal; Notable for the following components:   Hemoglobin 15.6 (*)    HCT 48.5 (*)    MCV 106.6 (*)    MCH 34.3 (*)    All other components within normal limits  LIPASE, BLOOD  OCCULT BLOOD X 1 CARD TO LAB, STOOL   ____________________________________________  RADIOLOGY  Ct Abdomen Pelvis W Contrast  Result Date: 11/25/2018 CLINICAL DATA:  Diarrhea with blood in stool.  Pain. EXAM: CT ABDOMEN AND PELVIS WITH CONTRAST TECHNIQUE: Multidetector CT imaging of the abdomen and pelvis was performed using the standard protocol following bolus administration of intravenous contrast. CONTRAST:  172mL ISOVUE-300 IOPAMIDOL (ISOVUE-300) INJECTION 61% COMPARISON:  January 14, 2018 abdomen CT and August 08, 2017 abdominal MRI. FINDINGS: Lower chest: There is slight atelectatic change in the bases. There is no lung base edema or consolidation. Hepatobiliary: There is hepatic steatosis. No focal liver lesions are appreciable. Gallbladder is absent. There is no appreciable biliary duct dilatation. Pancreas: There is no pancreatic mass or inflammatory focus. Spleen: No splenic lesions are evident. Adrenals/Urinary Tract: Left adrenal appears normal. There is a stable apparent right adrenal adenoma measuring 2.0 x 1.6 cm. Kidneys bilaterally show no evident mass or hydronephrosis on either side. There is no appreciable renal or ureteral calculus on either side. The urinary bladder is  midline. Urinary bladder wall appears mildly thickened. Stomach/Bowel: There is no evident diverticulitis. There is no appreciable bowel wall or mesenteric thickening. There is no bowel obstruction. There is no appreciable free air or portal venous air. Terminal ileum appears normal. There is mild lipomatous infiltration of the ileocecal valve. Vascular/Lymphatic: There is no evident abdominal aortic aneurysm. There is atherosclerosis in the aorta. There is a circumaortic left renal vein, an anatomic variant. Major arterial mesenteric vessels appear patent. There is no evident adenopathy in the abdomen or pelvis. Reproductive: The uterus is midline. There is a follicle in the left ovary measuring 2.0 by 1.7 cm. No other extrauterine mass is evident. Other: Appendix is seen in the right mid to lower abdomen without appendiceal inflammation. There is no demonstrable abscess or ascites in the abdomen or pelvis. Musculoskeletal: There is postoperative change at L4 and L5. There is a disc spacer at L4-5. There is degenerative change in the lower lumbar spine. There are no blastic or lytic bone lesions. There is no intramuscular or abdominal wall lesion. IMPRESSION: 1. No demonstrable diverticulitis. No evident bowel obstruction  or bowel wall thickening. A cause for blood in stool has not been established with this study. 2. Appendix appears normal. Report of previous appendectomy noted in the chart appears in error. 3.  Urinary bladder wall thickening.  Suspect a degree of cystitis. 4.  No evident renal or ureteral calculus.  No hydronephrosis. 5. Gallbladder absent. Postoperative change noted in the lower lumbar spine. 6.  Hepatic steatosis. 7.  Stable right adrenal adenoma, a benign finding. 8.  Aortic atherosclerosis. Electronically Signed   By: Lowella Grip III M.D.   On: 11/25/2018 09:35    ____________________________________________   PROCEDURES  Procedure(s) performed:   Procedures  None    ____________________________________________   INITIAL IMPRESSION / ASSESSMENT AND PLAN / ED COURSE  Pertinent labs & imaging results that were available during my care of the patient were reviewed by me and considered in my medical decision making (see chart for details).  Patient presents to the emergency department with concern for bloody diarrhea.  She is not having abdominal pain, fever, other signs of infectious or invasive diarrhea.  No recent travel or antibiotics.  Patient is low risk for C. difficile.  Abdomen is diffusely soft and nontender.  Rectal exam is unremarkable.  CT imaging obtained given her history which shows no acute findings such as colitis or diverticulitis.  Lab work shows no anemia or other significant electrolyte abnormality.  Patient is well-appearing and tolerating oral fluids here.  I do not think she would benefit from antibiotics as her symptoms are improving in terms of less frequent stools and no abdominal cramping pain or fevers.  Plan for Imodium as needed along with PCP/GI follow-up as indicated or suggested by the PCP.  Discussed ED return precautions in detail.  ____________________________________________  FINAL CLINICAL IMPRESSION(S) / ED DIAGNOSES  Final diagnoses:  Diarrhea of presumed infectious origin     MEDICATIONS GIVEN DURING THIS VISIT:  Medications  sodium chloride 0.9 % bolus 1,000 mL (0 mLs Intravenous Stopped 11/25/18 1000)  iopamidol (ISOVUE-300) 61 % injection 100 mL (100 mLs Intravenous Contrast Given 11/25/18 0913)     NEW OUTPATIENT MEDICATIONS STARTED DURING THIS VISIT:  Discharge Medication List as of 11/25/2018 10:44 AM    START taking these medications   Details  loperamide (IMODIUM) 2 MG capsule Take 1 capsule (2 mg total) by mouth 4 (four) times daily as needed for diarrhea or loose stools., Starting Thu 11/25/2018, Print        Note:  This document was prepared using Dragon voice recognition software and may  include unintentional dictation errors.  Nanda Quinton, MD Emergency Medicine    Long, Wonda Olds, MD 11/25/18 2027

## 2018-11-29 ENCOUNTER — Other Ambulatory Visit: Payer: Self-pay

## 2018-11-29 ENCOUNTER — Emergency Department (HOSPITAL_BASED_OUTPATIENT_CLINIC_OR_DEPARTMENT_OTHER)
Admission: EM | Admit: 2018-11-29 | Discharge: 2018-11-29 | Disposition: A | Discharge status: Home / Self Care | Payer: Commercial Managed Care - PPO | Attending: Emergency Medicine | Admitting: Emergency Medicine

## 2018-11-29 DIAGNOSIS — Z85828 Personal history of other malignant neoplasm of skin: Secondary | ICD-10-CM | POA: Diagnosis not present

## 2018-11-29 DIAGNOSIS — F419 Anxiety disorder, unspecified: Secondary | ICD-10-CM | POA: Diagnosis not present

## 2018-11-29 DIAGNOSIS — Z9049 Acquired absence of other specified parts of digestive tract: Secondary | ICD-10-CM | POA: Insufficient documentation

## 2018-11-29 DIAGNOSIS — Z79899 Other long term (current) drug therapy: Secondary | ICD-10-CM | POA: Diagnosis not present

## 2018-11-29 DIAGNOSIS — R1031 Right lower quadrant pain: Secondary | ICD-10-CM | POA: Diagnosis not present

## 2018-11-29 DIAGNOSIS — Z87891 Personal history of nicotine dependence: Secondary | ICD-10-CM | POA: Insufficient documentation

## 2018-11-29 DIAGNOSIS — R197 Diarrhea, unspecified: Secondary | ICD-10-CM | POA: Diagnosis not present

## 2018-11-29 DIAGNOSIS — R109 Unspecified abdominal pain: Secondary | ICD-10-CM | POA: Diagnosis present

## 2018-11-29 LAB — CBC WITH DIFFERENTIAL/PLATELET
ABS IMMATURE GRANULOCYTES: 0.01 10*3/uL (ref 0.00–0.07)
Basophils Absolute: 0 10*3/uL (ref 0.0–0.1)
Basophils Relative: 1 %
Eosinophils Absolute: 0.1 10*3/uL (ref 0.0–0.5)
Eosinophils Relative: 2 %
HCT: 47.7 % — ABNORMAL HIGH (ref 36.0–46.0)
Hemoglobin: 15.8 g/dL — ABNORMAL HIGH (ref 12.0–15.0)
Immature Granulocytes: 0 %
Lymphocytes Relative: 24 %
Lymphs Abs: 1.8 10*3/uL (ref 0.7–4.0)
MCH: 34.4 pg — AB (ref 26.0–34.0)
MCHC: 33.1 g/dL (ref 30.0–36.0)
MCV: 103.9 fL — ABNORMAL HIGH (ref 80.0–100.0)
MONO ABS: 0.7 10*3/uL (ref 0.1–1.0)
Monocytes Relative: 10 %
Neutro Abs: 4.9 10*3/uL (ref 1.7–7.7)
Neutrophils Relative %: 63 %
Platelets: 285 10*3/uL (ref 150–400)
RBC: 4.59 MIL/uL (ref 3.87–5.11)
RDW: 12.2 % (ref 11.5–15.5)
WBC: 7.6 10*3/uL (ref 4.0–10.5)
nRBC: 0 % (ref 0.0–0.2)

## 2018-11-29 LAB — COMPREHENSIVE METABOLIC PANEL
ALT: 34 U/L (ref 0–44)
AST: 27 U/L (ref 15–41)
Albumin: 4.7 g/dL (ref 3.5–5.0)
Alkaline Phosphatase: 86 U/L (ref 38–126)
Anion gap: 11 (ref 5–15)
BUN: 13 mg/dL (ref 6–20)
CO2: 21 mmol/L — ABNORMAL LOW (ref 22–32)
CREATININE: 0.7 mg/dL (ref 0.44–1.00)
Calcium: 9.6 mg/dL (ref 8.9–10.3)
Chloride: 100 mmol/L (ref 98–111)
GFR calc Af Amer: >60 mL/min (ref >60)
GFR calc non Af Amer: >60 mL/min (ref >60)
Glucose, Bld: 97 mg/dL (ref 70–99)
Potassium: 3.6 mmol/L (ref 3.5–5.1)
Sodium: 132 mmol/L — ABNORMAL LOW (ref 135–145)
Total Bilirubin: 1 mg/dL (ref 0.3–1.2)
Total Protein: 8.7 g/dL — ABNORMAL HIGH (ref 6.5–8.1)

## 2018-11-29 NOTE — ED Triage Notes (Signed)
Pt reports RLQ pain and emesis x 2 days , no nausea today, diarrhea x 2 days . No urinary symptoms. Alert and oriented x 4.

## 2018-11-29 NOTE — ED Provider Notes (Signed)
[facility] EMERGENCY DEPARTMENT Provider Note   CSN: [id number] Arrival date & time: 11/29/18  1024    History   Chief Complaint Chief Complaint  Patient presents with  . Diarrhea  . Abdominal Pain    RLQ    HPI Mikayla Tucker is a 58 y.o. female with history of hyperlipidemia, squamous cell cancer presenting to emergency department today with chief complaint of abdominal pain and diarrhea x 6 days.  Patient also had 2 episodes of non bilious non bloody emesis. Also reports associated right lower quadrant pain that she describes as cramping. The cramping has been intermittent and is without radiation or modifying factors. She rates the pain 3/10 in severity. She has taken 1 pill of Imodium without diarrhea relief. Denies fever, recent travel, antibiotic use, blood in stool, abdominal cramping, history of GI bleed.  Patient was seen in the emergency department x4 days ago with diarrhea and possible blood in stool. Patient's abdominal surgical history includes appendectomy and cholecystectomy.      Past Medical History:  Diagnosis Date  . Allergy   . Hyperlipidemia   . Squamous cell skin cancer    SCC    Patient Active Problem List   Diagnosis Date Noted  . Rotator cuff tendinitis, right 07/22/2018  . Adrenal mass ([place]) 08/06/2017  . Long term prescription benzodiazepine use 04/30/2017  . Macrocytosis without anemia 02/28/2016  . Encounter for preventive health examination 02/27/2016  . Overweight (BMI 25.0-29.9) 02/27/2016  . BPPV (benign paroxysmal positional vertigo) 08/07/2015  . Anxiety state 05/25/2015  . Prediabetes 11/30/2014  . Vitamin D deficiency 11/30/2014  . Lipoma of back 06/14/2014  . Dyslipidemia 06/13/2014  . History of adenomatous polyp of colon 06/13/2014    Past Surgical History:  Procedure Laterality Date  . APPENDECTOMY    . BACK SURGERY    . BREAST SURGERY     biopsy  . CHOLECYSTECTOMY    . COLONOSCOPY    . LIPOMA EXCISION     under breast on R side     OB History   No obstetric history on file.      Home Medications    Prior to Admission medications   Medication Sig Start Date End Date Taking? Authorizing Provider  atorvastatin (LIPITOR) 80 MG tablet Take 1 tablet (80 mg total) by mouth daily. 06/03/18   Kuneff, Renee A, DO  b complex vitamins capsule Take 1 capsule by mouth daily.    [provider]  Cholecalciferol 1000 units tablet Take 3,000 Units by mouth daily.     [provider]  ezetimibe (ZETIA) 10 MG tablet Take 1 tablet (10 mg total) by mouth daily. 06/04/18   Kuneff, Renee A, DO  fexofenadine (ALLEGRA) 180 MG tablet Take 180 mg by mouth daily.    [provider]  fluticasone (FLONASE) 50 MCG/ACT nasal spray Place 2 sprays into both nostrils daily. 07/15/16   Kuneff, Renee A, DO  loperamide (IMODIUM) 2 MG capsule Take 1 capsule (2 mg total) by mouth 4 (four) times daily as needed for diarrhea or loose stools. 11/25/18   Long, Wonda Olds, MD  LORazepam (ATIVAN) 1 MG tablet Take 1 tablet (1 mg total) by mouth 2 (two) times daily as needed for anxiety. 04/29/17   Kuneff, Renee A, DO  Magnesium 250 MG TABS Take 1 tablet by mouth.    [provider]  naproxen (NAPROSYN) 500 MG tablet Take 1 tablet (500 mg total) by mouth 2 (two) times daily with  a meal. 07/20/18   Kuneff, Renee A, DO    Family History Family History  Adopted: Yes  Problem Relation Age of Onset  . Colon cancer Neg Hx     Social History Social History   Tobacco Use  . Smoking status: Former Smoker    Packs/day: 1.00    Years: 10.00    Pack years: 10.00  . Smokeless tobacco: Never Used  . Tobacco comment: quit 58 yrs old  Substance Use Topics  . Alcohol use: Yes    Alcohol/week: 7.0 standard drinks    Types: 7 Glasses of wine per week  . Drug use: No     Allergies   Hydrocodone-acetaminophen   Review of Systems Review of Systems  Constitutional: Negative for fever.    Gastrointestinal: Positive for abdominal pain, diarrhea and vomiting. Negative for blood in stool and nausea.  Genitourinary: Negative for hematuria, pelvic pain, vaginal discharge and vaginal pain.  All other systems reviewed and are negative.    Physical Exam Updated Vital Signs BP 113/80   Pulse 85   Temp 98 F (36.7 C) (Oral)   Resp 18   SpO2 99%   Physical Exam Vitals signs and nursing note reviewed.  Constitutional:      Appearance: She is not ill-appearing or toxic-appearing.  HENT:     Head: Normocephalic and atraumatic.     Nose: Nose normal.     Mouth/Throat:     Mouth: Mucous membranes are moist.     Pharynx: Oropharynx is clear.  Eyes:     General: No scleral icterus.    Extraocular Movements: Extraocular movements intact.     Conjunctiva/sclera: Conjunctivae normal.     Pupils: Pupils are equal, round, and reactive to light.  Neck:     Musculoskeletal: Normal range of motion.  Cardiovascular:     Rate and Rhythm: Normal rate and regular rhythm.     Pulses: Normal pulses.     Heart sounds: Normal heart sounds.  Pulmonary:     Effort: Pulmonary effort is normal.     Breath sounds: Normal breath sounds.  Abdominal:     General: Bowel sounds are normal. There is no distension.     Palpations: Abdomen is soft.     Tenderness: There is no abdominal tenderness. There is no right CVA tenderness, left CVA tenderness, guarding or rebound.     Comments: No peritoneal signs.  Musculoskeletal: Normal range of motion.  Skin:    General: Skin is warm and dry.     Capillary Refill: Capillary refill takes less than 2 seconds.  Neurological:     Mental Status: She is alert. Mental status is at baseline.     Motor: No weakness.  Psychiatric:        Behavior: Behavior normal.      ED Treatments / Results  Labs (all labs ordered are listed, but only abnormal results are displayed) Labs Reviewed  CBC WITH DIFFERENTIAL/PLATELET - Abnormal; Notable for the  following components:      Result Value   Hemoglobin 15.8 (*)    HCT 47.7 (*)    MCV 103.9 (*)    MCH 34.4 (*)    All other components within normal limits  COMPREHENSIVE METABOLIC PANEL - Abnormal; Notable for the following components:   Sodium 132 (*)    CO2 21 (*)    Total Protein 8.7 (*)    All other components within normal limits  GASTROINTESTINAL PANEL BY PCR, STOOL (REPLACES STOOL  CULTURE)    EKG None  Radiology No results found.  Procedures Procedures (including critical care time)  Medications Ordered in ED Medications - No data to display   Initial Impression / Assessment and Plan / ED Course  I have reviewed the triage vital signs and the nursing notes.  Pertinent labs & imaging results that were available during my care of the patient were reviewed by me and considered in my medical decision making (see chart for details).     Patient is afebrile, nontoxic, nonseptic appearing, in no apparent distress. Pt is not having abdominal pain currently. She denies recent travel or antibiotic use, she is low risk for C Diff.Today's lab work includes CBC shows anemia but no other electrolyte abnormalities. Pt does not meet SIRS or Sepsis Criteria. She was evaluated in the ED x4 days ago with concern for bloody diarrhea. She had a negative CT scan that did not show signs of colitis or diverticulitis. Also had unremarkable rectal exam. Today pt does not clinically appear to be dehydrated. She is tolerating PO fluids here. On repeat exam patient does not have a surgical abdomen and there are no peritoneal signs. GI panel pending.  Patient discharged home with symptomatic treatment and given strict instructions for follow-up with their primary care physician/GI for further evaluation of her diarrhea.  I have also discussed reasons to return immediately to the ER.  Patient expresses understanding and agrees with plan. Pt case discussed with Dr. Maryan Rued who agrees with my plan.          Final Clinical Impressions(s) / ED Diagnoses   Final diagnoses:  Diarrhea, unspecified type  Abdominal pain, unspecified abdominal location    ED Discharge Orders    None       Flint Melter 12/01/18 1003    Blanchie Dessert, MD 12/01/18 2118

## 2018-11-29 NOTE — Discharge Instructions (Signed)
You have been seen today for abdominal pain and diarrhea. Please read and follow all provided instructions. Return to the emergency room for worsening condition or new concerning symptoms.   A stool sample was collected today and if anything comes back positive you will receive a phone call of those results.  You can also check on My Chart for the results.  1. Medications:  Continue to take Imodium as directed,  if needed. Continue usual home medications Take medications as prescribed. Please review all of the medicines and only take them if you do not have an allergy to them.  2. Treatment: rest, drink plenty of fluids.  It is important to increase fluid intake over the next several days to ensure that your body stays hydrated. 3. Follow Up: Please follow up with your primary doctor in 2-5 days for discussion of your diagnoses and further evaluation after today's visit; Call today to arrange your follow up.  If you do not have a primary care doctor use the resource guide provided to find one;   It is also a possibility that you have an allergic reaction to any of the medicines that you have been prescribed - Everybody reacts differently to medications and while MOST people have no trouble with most medicines, you may have a reaction such as nausea, vomiting, rash, swelling, shortness of breath. If this is the case, please stop taking the medicine immediately and contact your physician.  ?

## 2018-11-30 LAB — GASTROINTESTINAL PANEL BY PCR, STOOL (REPLACES STOOL CULTURE)

## 2019-01-25 ENCOUNTER — Telehealth: Payer: Self-pay | Admitting: Neurology

## 2019-01-25 NOTE — Telephone Encounter (Signed)
Patient has been added to Sarah's webex calendar and e-mail has been sent.

## 2019-01-25 NOTE — Telephone Encounter (Signed)
Pt has consented to a Virtual Visit and for the insurance to be billed as such. Pt was informed that her copay will be billed. Email has been confirmed.

## 2019-01-25 NOTE — Progress Notes (Signed)
Virtual Visit via Video Note  I connected with Seraya Jobst on 01/26/19 at  9:45 AM EDT by a video enabled telemedicine application and verified that I am speaking with the correct person using two identifiers.   I discussed the limitations of evaluation and management by telemedicine and the availability of in person appointments. The patient expressed understanding and agreed to proceed.  History of Present Illness: 01/26/2019 SS: Ms. Banner is a 58 year old female with history of dizziness and headaches. She has history of BPV, cured by vestibular rehab. She had a normal MRI and MRA of the brain in 2018.  I did a VV with Ms. Stroebel today who was at work.  She reports 2 days ago she had a recurrence of the vertigo.  She reports it occurred while she was lying in bed, in a dark room, she felt her eyes actually going back and forth, it lasted for a few seconds.  She says it comes on very suddenly, lasting 5 seconds, she thinks it is related to her right ear.  Over the last 2 days the episodes of vertigo have happened maybe 3 times.  At her job she does have to get things from high shelves, look upwards at shelving, this is provoking.  She does report she has had seasonal allergies, she denies any recent illness.  She does report some minor discomfort to her right ear, but this is, seasonal allergies.  She reports she is able to almost force the vertigo if she rolls onto her right side.  She denies any symptoms of numbness, weakness, tingling in her extremities.  There is no difficulty with speech.  She reports she had 14 visits with neuro rehab in 2018, worked with an excellent therapist, Engineer, civil (consulting).  She reports she was completely cured.  She is requesting to have further management at vestibular rehab.  She found this very beneficial.  She does feel that even if she was able to talk with a therapist, they could advise her on techniques to improve the vertigo.  She does report that she is currently doing  the next 56-day diet, limiting sugar, becoming healthier.  She apparently does drink occasional wine, she says she is hydrated, drinking plenty of water.  She has started taking over-the-counter magnesium.  She denies any other changes in her medications or medical history. She is taking flonase and allegra.  09/22/2017 Dr. Brett Fairy: HPI:  Corinda Ammon is a 58 y.o. female , seen here as in a referral  from Dr. Raoul Pitch for headaches associated with dizziness.  Chief complaint according to patient : 6 weeks ago the patient suffered an acute onset of dizziness that seem to have vertiginous character.  She felt moving when she was not in motion, she was mildly nauseated '" squeezy".  Certain movements of the head provoked this vertigo that lasted longer than the original movement.  The sensation would go and come as it pleased.  2 times she suffered a spell that she best described as a blackout.  She felt as if her brain switched off and after a short period of seconds rebooted itself. She noted ear aches and the feeling of fluid in the ear- right side. Takes allergy preventive meds every day.  She does not recall if her vision changed, if there was another sensation of lack of awareness, or loss of consciousness. Over the last 2 weeks or so she has not had 1 of these rebooting spells, and she has  had less headaches but again more dizziness. The headaches are described throbbing, not burning, pulsating sensation- lasting minutes to an hour. 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  The headaches come and go but she has daily bouts of dizziness as well.,  She is not sure that headache and dizziness are related.  However they both started about 6 weeks ago.  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  During the same timeframe she also  developed elevated liver enzymes, she sees Dr. Gatha Mayer gastroenterologist about the elevated liver enzymes and the feeling of abdominal distention.  She was diagnosed with a fatty liver, microcytic anemia, an MRI of the abdomen of August 08, 2017 was interpreted as showing a right adrenal adenoma 2.7 cm hypoechoic 2.5 by 2.6 by 2.1 centimeter mass between the liver and right kidney.  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   Medical history: The patient underwent a discectomy with fusion and lumbar cage L4-5 in 2001.  Cholecystectomy over 10 years ago.  Microcytosis,  elevated LFT and adrenal mass.   Social history: married, 1 child ( daughter 73 at [facility])  7 alcohol drinks/ week. Stopped 10 days ago. Quit smoking 1985, caffeine -2 cups of coffee a day, no energy drinks or sodas.  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   09-22-2017, Mrs. Zadie Rhine has undergone vestibular rehab at the [facility] next door felt immediately with her first session because of vertigo was addressed, and that she has been 99% better since.  She also developed a headache right after the session and now do the conclusion that her headaches were related to vertigo episodes.  She has not had vertigo episodes since the treatment and is expected to have her second treatment today at 830.  The MRI of the brain was performed on 4 December and the results were called by Dr. Jannifer Franklin, they were absolutely normal, there was no angiographic evidence of any vascular abnormality especially bilateral vertebral, basilar and posterior cerebral arteries were seen normal flow void no evidence of stenosis.   Observations/Objective: Alert, answers questions appropriately, facial symmetry noted, follows commands, full range of motion of neck, symmetric movement of the eyes, no  nystagmus seen, no arm drift, gait is intact  Assessment and Plan: 1.  Vertigo, history of BPPV  She reports 2 days ago she had a recurrence of her vertigo. She has had 3 episodes of vertigo in the past 2 days, they are very short lasting, provoked by being in a dark room, or looking upward. She has not had any headache. She had this in 2018, did 14 sessions of vestibular rehab at [facility] neuro rehab, she was completely cured.  The recurrence of vertigo is identical to her previous presentation.  She is requesting evaluation at neuro rehab, with therapist Geoffry Paradise if available.  She does not have any new problems or concerns.  She does report recent seasonal allergies, she denies true pain to her right ear, however she does report some mild discomfort related to her allergies.  We did discuss possibly having her primary care doctor take a look at her ears to  rule out infection. She is using allegra and flonase. I will place a referral for vestibular rehab at neuro rehab for consultation.   Follow Up Instructions: 3-4 months revisit    I discussed the assessment and treatment plan with the patient. The patient was provided an opportunity to ask questions and all were answered. The patient agreed with the plan and demonstrated an understanding of the instructions.   The patient was advised to call back or seek an in-person evaluation if the symptoms worsen or if the condition fails to improve as anticipated.  I provided 30 minutes of non-face-to-face time during this encounter.  Evangeline Dakin, DNP  [facility] [address] [place], [place] [address] [phone]

## 2019-01-26 ENCOUNTER — Encounter: Payer: Self-pay | Admitting: Neurology

## 2019-01-26 ENCOUNTER — Ambulatory Visit (INDEPENDENT_AMBULATORY_CARE_PROVIDER_SITE_OTHER): Payer: Commercial Managed Care - PPO | Admitting: Neurology

## 2019-01-26 ENCOUNTER — Other Ambulatory Visit: Payer: Self-pay

## 2019-01-26 DIAGNOSIS — H8111 Benign paroxysmal vertigo, right ear: Secondary | ICD-10-CM

## 2019-02-07 ENCOUNTER — Other Ambulatory Visit: Payer: Self-pay

## 2019-02-07 ENCOUNTER — Ambulatory Visit: Payer: Commercial Managed Care - PPO | Attending: Neurology | Admitting: Physical Therapy

## 2019-02-07 ENCOUNTER — Encounter: Payer: Self-pay | Admitting: Physical Therapy

## 2019-02-07 DIAGNOSIS — H8112 Benign paroxysmal vertigo, left ear: Secondary | ICD-10-CM | POA: Insufficient documentation

## 2019-02-07 DIAGNOSIS — H8111 Benign paroxysmal vertigo, right ear: Secondary | ICD-10-CM | POA: Insufficient documentation

## 2019-02-07 NOTE — Patient Instructions (Signed)
Sit to Side-Lying    Sit on edge of bed. 1. Turn head 45 to right. 2. Maintain head position and lie down slowly on left side. Hold until symptoms subside. 3. Sit up slowly. Hold until symptoms subside. 4. Turn head 45 to left. 5. Maintain head position and lie down slowly on right side. Hold until symptoms subside. 6. Sit up slowly. Repeat sequence ___3-5_ times per session. Do __2-3__ sessions per day.  LIE DOWN ON RIGHT SIDE FIRST!!   Tip Card  1.The goal of habituation training is to assist in decreasing symptoms of vertigo, dizziness, or nausea provoked by specific head and body motions. 2.These exercises may initially increase symptoms; however, be persistent and work through symptoms. With repetition and time, the exercises will assist in reducing or eliminating symptoms. 3.Exercises should be stopped and discussed with the therapist if you experience any of the following: - Sudden change or fluctuation in hearing - New onset of ringing in the ears, or increase in current intensity - Any fluid discharge from the ear - Severe pain in neck or back - Extreme nausea

## 2019-02-07 NOTE — Therapy (Signed)
[facility] [address] [place], [place], [address] Phone: [phone]   Fax:  [phone]  Physical Therapy Evaluation  Patient Details  Name: Mikayla Tucker MRN: [id number] Date of Birth: [birth year] Referring Provider (PT): Butler Denmark, NP   CLINIC OPERATION CHANGES: [facility] is operating at a low capacity due to COVID-19.  The patient was brought into the clinic for evaluation and/or  treatment following universal masking by staff, social distancing, and <10 people in the clinic.  The patient's COVID risk of complications score is 0.  Encounter Date: 02/07/2019  PT End of Session - 02/07/19 2050    Visit Number  1    Number of Visits  4    Date for PT Re-Evaluation  03/10/19    Authorization Type  [employer]    PT Start Time  6629    PT Stop Time  1231    PT Time Calculation (min)  46 min    Activity Tolerance  Patient tolerated treatment well    Behavior During Therapy  WFL for tasks assessed/performed       Past Medical History:  Diagnosis Date  . Allergy   . Hyperlipidemia   . Squamous cell skin cancer    SCC    Past Surgical History:  Procedure Laterality Date  . APPENDECTOMY    . BACK SURGERY    . BREAST SURGERY     biopsy  . CHOLECYSTECTOMY    . COLONOSCOPY    . LIPOMA EXCISION     under breast on R side    There were no vitals filed for this visit.   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 2018 and again in May 2019    Patient Stated Goals  resolve the vertigo    Currently in Pain?  No/denies         [facility]  PT Assessment - 02/07/19 0001      Assessment   Medical Diagnosis  BPPV    Referring Provider (PT)  Butler Denmark, NP    Onset Date/Surgical Date  01/24/19    Prior Therapy  Jan - Feb. 2019;  1 visit only in May 2019      Precautions   Precautions  None      Balance Screen   Has the patient fallen in the past 6 months  No    Has the patient had a decrease in activity level because of a fear of falling?   No    Is the patient reluctant to leave their home because of a fear of falling?   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           Vestibular Assessment - 02/07/19 0001      Vestibular Assessment   General Observation  Pt is a 58 yr old lady with c/o vertigo that  started on 01-24-19 during the night when she turned over in bed; states the vertigo seems to be worse at night when it is dark      Symptom Behavior   Subjective history of current problem  episode started approx. 01-24-19 and occurs with turning over in bed    Type of Dizziness   Spinning    Frequency of Dizziness  varies depending on the movement    Duration of Dizziness  seconds    Symptom Nature  Positional    Aggravating Factors  Looking up to the ceiling;Rolling to right    Relieving Factors  Head stationary;Lying supine    Progression of Symptoms  No change since onset    History of similar episodes  pt had episode in May 2019      Positional Testing   Dix-Hallpike  Dix-Hallpike Right;Dix-Hallpike Left      Dix-Hallpike Right   Dix-Hallpike Right Duration  approx. 18 secs     Dix-Hallpike Right Symptoms  Upbeat, right rotatory nystagmus;Downbeat, right rotatory nystagmus   downbeat occurred on 3rd rep of Epley     Dix-Hallpike Left   Dix-Hallpike Left Duration  none    Dix-Hallpike Left Symptoms  No nystagmus           Objective measurements completed on examination: See above findings.       Epley maneuver x 4 reps administered for treatment of Rt posterior canalithiasis- symptoms improved on 4th rep             PT Long Term Goals - 02/07/19 2103      PT LONG TERM GOAL #1   Title  Pt will be IND in HEP for vestibular exercises.     Time  4    Period  Weeks    Status  New    Target Date  03/10/19      PT LONG TERM GOAL #2   Title  Pt will have (-) Rt Dix-Hallpike test to indicate resolution of Rt BPPV.    Time  4    Period  Weeks    Status  New    Target Date  03/10/19      PT LONG TERM GOAL #3   Title  Pt will report no dizziness with bed mobility or with looking up.    Time  4    Period  Weeks    Status  New    Target Date  03/10/19             Plan - 02/07/19 2054    Clinical Impression Statement  Pt has Rt rotary upbeating nystagmus in Rt Dix-Hallpike test position, indicative of Rt posterior canalithiasis; pt had Rt rotary downbeating nystagmus on 3rd rep of Epley maneuver, indicative of Rt anterior canalithiasis.  Symptoms were much improved on 4th rep of Epley maneuver.      Personal Factors and Comorbidities  Past/Current Experience;Comorbidity 1    Examination-Activity Limitations  Locomotion Level;Reach Overhead    Examination-Participation Restrictions  Community Activity;Other   work   Stability/Clinical Decision Making  Stable/Uncomplicated    Clinical Decision Making  Low    Rehab Potential  Good    PT Frequency  1x / week    PT Duration  4 weeks    PT Treatment/Interventions  ADLs/Self Care Home Management;Canalith Repostioning;Patient/family education;Neuromuscular re-education;Therapeutic exercise;Balance training    PT Next Visit Plan  recheck Rt BPPV    PT Home Exercise Plan  Nestor Lewandowsky exercise  Consulted and Agree with Plan of Care  Patient       Patient will benefit from skilled therapeutic intervention in order to improve the  following deficits and impairments:  Dizziness  Visit Diagnosis: BPPV (benign paroxysmal positional vertigo), right - Plan: PT plan of care cert/re-cert     Problem List Patient Active Problem List   Diagnosis Date Noted  . Rotator cuff tendinitis, right 07/22/2018  . Adrenal mass ([place]) 08/06/2017  . Long term prescription benzodiazepine use 04/30/2017  . Macrocytosis without anemia 02/28/2016  . Encounter for preventive health examination 02/27/2016  . Overweight (BMI 25.0-29.9) 02/27/2016  . BPPV (benign paroxysmal positional vertigo) 08/07/2015  . Anxiety state 05/25/2015  . Prediabetes 11/30/2014  . Vitamin D deficiency 11/30/2014  . Lipoma of back 06/14/2014  . Dyslipidemia 06/13/2014  . History of adenomatous polyp of colon 06/13/2014    Laderius Valbuena, [name], PT 02/07/2019, 9:09 PM  [facility] [address] [place], [place], [address] Phone: [phone]   Fax:  [phone]  Name: Mikayla Tucker MRN: [id number] Date of Birth: September 04, 1961

## 2019-02-11 ENCOUNTER — Ambulatory Visit: Payer: Self-pay | Admitting: *Deleted

## 2019-02-11 NOTE — Telephone Encounter (Signed)
Called transferred to [facility] at [facility] regarding coronavirus testing.

## 2019-02-14 ENCOUNTER — Other Ambulatory Visit: Payer: Self-pay

## 2019-02-14 ENCOUNTER — Ambulatory Visit (INDEPENDENT_AMBULATORY_CARE_PROVIDER_SITE_OTHER): Payer: Commercial Managed Care - PPO | Admitting: Family Medicine

## 2019-02-14 ENCOUNTER — Ambulatory Visit: Payer: Commercial Managed Care - PPO | Admitting: Physical Therapy

## 2019-02-14 ENCOUNTER — Encounter: Payer: Self-pay | Admitting: Family Medicine

## 2019-02-14 VITALS — Temp 98.6°F | Ht 64.0 in

## 2019-02-14 DIAGNOSIS — H8112 Benign paroxysmal vertigo, left ear: Secondary | ICD-10-CM

## 2019-02-14 DIAGNOSIS — Z20822 Contact with and (suspected) exposure to covid-19: Secondary | ICD-10-CM

## 2019-02-14 DIAGNOSIS — Z20828 Contact with and (suspected) exposure to other viral communicable diseases: Secondary | ICD-10-CM

## 2019-02-14 DIAGNOSIS — H8111 Benign paroxysmal vertigo, right ear: Secondary | ICD-10-CM | POA: Diagnosis not present

## 2019-02-14 NOTE — Progress Notes (Signed)
Virtual Visit via Video Note  I connected with Mikayla Tucker on 02/14/19 at  1:20 PM EDT by a video enabled telemedicine application and verified that I am speaking with the correct person using two identifiers.  Location patient: home Location provider:work or home office Persons participating in the virtual visit: patient, provider  I discussed the limitations of evaluation and management by telemedicine and the availability of in person appointments. The patient expressed understanding and agreed to proceed.  Telemedicine visit is a necessity given the COVID-19 restrictions in place at the current time.  HPI: 58 y/o WF being seen today for concern of exposure to covid 19.  Was around a coworkder in close contact w/out mask about 2 wks ago who tested positive for covid. Also has had some contacts who have had + covid IgG ab tests recently. Mikayla Tucker has no symptoms at all-->has felt completely well. She is going to visit a friend with cancer and wishes to get covid 19 ab testing to hopefully better gauge how cautious to be.   Past Medical History:  Diagnosis Date  . Allergy   . Hyperlipidemia   . Squamous cell skin cancer    SCC    Past Surgical History:  Procedure Laterality Date  . APPENDECTOMY    . BACK SURGERY    . BREAST SURGERY     biopsy  . CHOLECYSTECTOMY    . COLONOSCOPY    . LIPOMA EXCISION     under breast on R side    Family History  Adopted: Yes  Problem Relation Age of Onset  . Colon cancer Neg Hx      Current Outpatient Medications:  .  atorvastatin (LIPITOR) 80 MG tablet, Take 1 tablet (80 mg total) by mouth daily., Disp: 90 tablet, Rfl: 3 .  b complex vitamins capsule, Take 1 capsule by mouth daily., Disp: , Rfl:  .  Cholecalciferol 1000 units tablet, Take 3,000 Units by mouth daily. , Disp: , Rfl:  .  ezetimibe (ZETIA) 10 MG tablet, Take 1 tablet (10 mg total) by mouth daily., Disp: 90 tablet, Rfl: 3 .  fexofenadine (ALLEGRA) 180 MG tablet, Take 180 mg by mouth  daily., Disp: , Rfl:  .  fluticasone (FLONASE) 50 MCG/ACT nasal spray, Place 2 sprays into both nostrils daily., Disp: 16 g, Rfl: 3 .  loperamide (IMODIUM) 2 MG capsule, Take 1 capsule (2 mg total) by mouth 4 (four) times daily as needed for diarrhea or loose stools., Disp: 12 capsule, Rfl: 0 .  LORazepam (ATIVAN) 1 MG tablet, Take 1 tablet (1 mg total) by mouth 2 (two) times daily as needed for anxiety., Disp: 60 tablet, Rfl: 5 .  Magnesium 250 MG TABS, Take 1 tablet by mouth., Disp: , Rfl:  .  naproxen (NAPROSYN) 500 MG tablet, Take 1 tablet (500 mg total) by mouth 2 (two) times daily with a meal. (Patient not taking: Reported on 01/26/2019), Disp: 30 tablet, Rfl: 0  EXAM:  VITALS per patient if applicable:  GENERAL: alert, oriented, appears well and in no acute distress  HEENT: atraumatic, conjunttiva clear, no obvious abnormalities on inspection of external nose and ears  NECK: normal movements of the head and neck  LUNGS: on inspection no signs of respiratory distress, breathing rate appears normal, no obvious gross SOB, gasping or wheezing  CV: no obvious cyanosis  MS: moves all visible extremities without noticeable abnormality  PSYCH/NEURO: pleasant and cooperative, no obvious depression or anxiety, speech and thought processing grossly intact  LABS:  none today    Chemistry      Component Value Date/Time   NA 132 (L) 11/29/2018 1150   K 3.6 11/29/2018 1150   CL 100 11/29/2018 1150   CO2 21 (L) 11/29/2018 1150   BUN 13 11/29/2018 1150   CREATININE 0.70 11/29/2018 1150   CREATININE 0.60 04/29/2017 1130      Component Value Date/Time   CALCIUM 9.6 11/29/2018 1150   ALKPHOS 86 11/29/2018 1150   AST 27 11/29/2018 1150   ALT 34 11/29/2018 1150   BILITOT 1.0 11/29/2018 1150     Lab Results  Component Value Date   WBC 7.6 11/29/2018   HGB 15.8 (H) 11/29/2018   HCT 47.7 (H) 11/29/2018   MCV 103.9 (H) 11/29/2018   PLT 285 11/29/2018    ASSESSMENT AND  PLAN:  Discussed the following assessment and plan:  Exposure to COVID 19. Mikayla Tucker asymptomatic but she wants to try to better gauge her potential for possibly being asymptomatic shedder of covid by getting covid 19 IgG antibody test done. I explained the nonspecific nature of the ab test (+for coronaviruses exposure/past infection but not specific to covid 19 virus). She expressed understanding and wanted to proceed with covid 19 IgG antibody testing--ordered future.   I discussed the assessment and treatment plan with the patient. The patient was provided an opportunity to ask questions and all were answered. The patient agreed with the plan and demonstrated an understanding of the instructions.   The patient was advised to call back or seek an in-person evaluation if the symptoms worsen or if the condition fails to improve as anticipated.  F/u: as needed.  Signed:  Crissie Sickles, MD           02/14/2019

## 2019-02-14 NOTE — Telephone Encounter (Signed)
Scheduled patient for virtual visit today

## 2019-02-15 NOTE — Therapy (Signed)
Mikayla Tucker [address] [place], [place], [address] Phone: [phone]   Fax:  [phone]  Physical Therapy Treatment  Patient Details  Name: Mikayla Tucker MRN: [id number] Date of Birth: 02-19-1961 Referring Provider (PT): Butler Denmark, NP   CLINIC OPERATION CHANGES: [facility] is operating at a low capacity due to COVID-19.  The patient was brought into the clinic for evaluation and/or treatment  following universal masking by staff, social distancing, and <10 people in the clinic.  The patient's COVID risk of complications score is 0.  Encounter Date: 02/14/2019  PT End of Session - 02/15/19 1641    Visit Number  2    Number of Visits  4    Date for PT Re-Evaluation  03/10/19    Authorization Type  [employer]    PT Start Time  1236    PT Stop Time  1315    PT Time Calculation (min)  39 min    Activity Tolerance  Patient tolerated treatment well    Behavior During Therapy  WFL for tasks assessed/performed       Past Medical History:  Diagnosis Date  . Allergy   . BPPV (benign paroxysmal positional vertigo)   . Hyperlipidemia   . Squamous cell skin cancer    SCC    Past Surgical History:  Procedure Laterality Date  . APPENDECTOMY    . BACK SURGERY    . BREAST SURGERY     biopsy  . CHOLECYSTECTOMY    . COLONOSCOPY    . LIPOMA EXCISION     under breast on R side    There were no vitals filed for this visit.  Subjective Assessment - 02/15/19 1640    Subjective  Pt reports vertigo is much improved from what it was at time of 02-07-19 treatment session but says it is still not completely gone    Pertinent History  h/o BPPV in Dec. 2018 and again in May 2019    Patient Stated Goals  resolve the vertigo    Currently in Pain?  No/denies       Rt Dix-Hallpike (-) with no c/o vertigo and no nystagmus noted  Lt Dix-Hallpike (+) with Lt upbeating rotary nystagmus and c/o vertigo -  indicative of Lt posterior canal BPPV    Canalith repositioning - Epley maneuver for Lt posterior canalithiasis performed 3 reps with symptoms improving on each rep of Epley maneuver      Self Care; reviewed Brandt-Daroff exercises for habituation of Lt BPPV prn - if vertigo not fully resolved                     PT Long Term Goals - 02/07/19 2103      PT LONG TERM GOAL #1   Title  Pt will be IND in HEP for vestibular exercises.     Time  4    Period  Weeks    Status  New    Target Date  03/10/19      PT LONG TERM GOAL #2   Title  Pt will have (-) Rt Dix-Hallpike test to indicate resolution of Rt BPPV.    Time  4    Period  Weeks    Status  New    Target Date  03/10/19      PT LONG TERM GOAL #3   Title  Pt will report no dizziness with bed mobility or with looking up.    Time  4    Period  Weeks    Status  New    Target Date  03/10/19            Plan - 02/15/19 1642    Clinical Impression Statement  Pt had (+) Lt Dix-Hallpike test with Lt rotary upbeating nystagmus with c/o vertigo, indicative of Lt BPPV posterior canalithiasis.  Symptoms appeared to be almost fully resolved on 3rd rep of Epley maneuver.  Pt had (-) Rt Dix-Hallpike test today, indicating resolution of Rt posterior canal BPPV (had Rt BPPV at tx session on 02-07-19).    Personal Factors and Comorbidities  Past/Current Experience;Comorbidity 1    Examination-Activity Limitations  Bed Mobility    Examination-Participation Restrictions  Community Activity;Other    Rehab Potential  Good    PT Frequency  1x / week    PT Duration  4 weeks    PT Treatment/Interventions  ADLs/Self Care Home Management;Canalith Repostioning;Patient/family education;Neuromuscular re-education;Therapeutic exercise;Balance training    PT Next Visit Plan  recheck Lt BPPV    PT Home Exercise Plan  Nestor Lewandowsky exercise    Consulted and Agree with Plan of Care  Patient       Patient will benefit from skilled  therapeutic intervention in order to improve the following deficits and impairments:  Dizziness  Visit Diagnosis: BPPV (benign paroxysmal positional vertigo), left     Problem List Patient Active Problem List   Diagnosis Date Noted  . Rotator cuff tendinitis, right 07/22/2018  . Adrenal mass ([place]) 08/06/2017  . Long term prescription benzodiazepine use 04/30/2017  . Macrocytosis without anemia 02/28/2016  . Encounter for preventive health examination 02/27/2016  . Overweight (BMI 25.0-29.9) 02/27/2016  . BPPV (benign paroxysmal positional vertigo) 08/07/2015  . Anxiety state 05/25/2015  . Prediabetes 11/30/2014  . Vitamin D deficiency 11/30/2014  . Lipoma of back 06/14/2014  . Dyslipidemia 06/13/2014  . History of adenomatous polyp of colon 06/13/2014    Mikayla Tucker, [name], PT 02/15/2019, 4:45 PM  [facility] [address] [place], [place], [address] Phone: [phone]   Fax:  [phone]  Name: Mikayla Tucker MRN: [id number] Date of Birth: August 08, 1961

## 2019-02-16 ENCOUNTER — Other Ambulatory Visit: Payer: Self-pay

## 2019-02-16 ENCOUNTER — Other Ambulatory Visit (INDEPENDENT_AMBULATORY_CARE_PROVIDER_SITE_OTHER): Payer: Commercial Managed Care - PPO

## 2019-02-16 DIAGNOSIS — Z20822 Contact with and (suspected) exposure to covid-19: Secondary | ICD-10-CM

## 2019-02-16 DIAGNOSIS — Z20828 Contact with and (suspected) exposure to other viral communicable diseases: Secondary | ICD-10-CM

## 2019-02-17 ENCOUNTER — Telehealth: Payer: Self-pay

## 2019-02-17 ENCOUNTER — Encounter: Payer: Commercial Managed Care - PPO | Admitting: Family Medicine

## 2019-02-17 LAB — SAR COV2 SEROLOGY (COVID19)AB(IGG),IA: SARS CoV2 AB IGG: NEGATIVE

## 2019-02-17 NOTE — Telephone Encounter (Signed)
Pt was given results, verbalized understanding.  Copied from [place] [phone]. Topic: Quick Communication - Other Results (Clinic Use ONLY) >> Feb 17, 2019  8:29 AM Lennox Solders wrote: Pt would like covid 19 antibody results

## 2019-02-17 NOTE — Telephone Encounter (Unsigned)
Copied from [place] [phone]. Topic: Quick Communication - Other Results (Clinic Use ONLY) >> Feb 17, 2019  8:29 AM Lennox Solders wrote: Pt would like covid 19 antibody results

## 2019-03-07 ENCOUNTER — Ambulatory Visit: Payer: Commercial Managed Care - PPO | Admitting: Physical Therapy

## 2019-04-18 ENCOUNTER — Telehealth: Payer: Self-pay

## 2019-04-18 NOTE — Telephone Encounter (Signed)
Copied from [place] [phone]. Topic: Appointment Scheduling - Scheduling Inquiry for Clinic >> Apr 18, 2019  7:47 AM Rayann Heman wrote: Reason for CRM: pt called and stated that she would like to schedule an appointment for very bad shoulder pain. Please advise. >> Apr 18, 2019  9:00 AM Edmonia Caprio wrote: Patient walked in inquiring about referral to ortho for her shoulder pain. She says that pain is worse and Dr.Kuneff had given her a referral. I did not see referral. Diane spoke to her, said her insurance company [employer] did not require referral to ortho. Patient said she was going to try and make an appt to see someone asap.   She inquired about her annual physical. I scheduled her appt for 05/09/19 with Dr. Raoul Pitch.  Sending to [facility] clinical team just as an [place].  She will call if we need to assist in scheduling and/or sending office notes

## 2019-04-19 NOTE — Telephone Encounter (Signed)
Pt was called and states she has appt Monday with Ortho but the Aleve she has been using is not working. Pt taking one Aleve q5-6 hrs.Pt would like to get some relief until appt Monday. When pt was asked to make appt she stated she has already been seen for this issue and does not feel she should have to be seen again. Please advise.  [place]

## 2019-04-19 NOTE — Telephone Encounter (Addendum)
Pt has an appt on Monday 04-25-2019 with [facility] and she would like pain medication or naproxen for her right shoulder pain . [facility].please call pt

## 2019-04-19 NOTE — Telephone Encounter (Signed)
Spoke with patient regarding pain medication request.  Advised patient she will need to be seen in order to receive prescription. Explained to patient her last OV related to her shoulder was 6 months ago, she did not realize it was that long ago. Patient declines appointment, will continue to take Aleve Liquid Gels. Advised to follow manufacturer directions on dosage. Patient verbalized understanding.

## 2019-04-19 NOTE — Telephone Encounter (Signed)
If over the counter NSAIDS are not working at high doses, the next option is a controlled substance.  It is  illegal to prescribe pain meds without seeing a person. So she will need to have an appt if she is desiring me to provide her care.  - Even if she does not want a controled substance, she will still need appt. In order to provide ongoing care, even if seen for a condition in the past, a patient must been seen for follow up if desiring further care.   Documentation only: pt last seen 6 mos ago for a different condition.

## 2019-05-09 ENCOUNTER — Other Ambulatory Visit: Payer: Self-pay

## 2019-05-09 ENCOUNTER — Ambulatory Visit (INDEPENDENT_AMBULATORY_CARE_PROVIDER_SITE_OTHER): Payer: Commercial Managed Care - PPO | Admitting: Family Medicine

## 2019-05-09 ENCOUNTER — Encounter: Payer: Self-pay | Admitting: Family Medicine

## 2019-05-09 VITALS — BP 121/76 | HR 80 | Temp 97.8°F | Resp 16 | Ht 64.5 in | Wt 151.2 lb

## 2019-05-09 DIAGNOSIS — Z0000 Encounter for general adult medical examination without abnormal findings: Secondary | ICD-10-CM | POA: Diagnosis not present

## 2019-05-09 DIAGNOSIS — E663 Overweight: Secondary | ICD-10-CM

## 2019-05-09 DIAGNOSIS — D7589 Other specified diseases of blood and blood-forming organs: Secondary | ICD-10-CM

## 2019-05-09 DIAGNOSIS — E782 Mixed hyperlipidemia: Secondary | ICD-10-CM | POA: Diagnosis not present

## 2019-05-09 DIAGNOSIS — R7303 Prediabetes: Secondary | ICD-10-CM | POA: Diagnosis not present

## 2019-05-09 DIAGNOSIS — E559 Vitamin D deficiency, unspecified: Secondary | ICD-10-CM | POA: Diagnosis not present

## 2019-05-09 MED ORDER — ATORVASTATIN CALCIUM 80 MG PO TABS: 80.0000 mg | ORAL_TABLET | Freq: Every day | ORAL | 3 refills | Status: DC

## 2019-05-09 NOTE — Patient Instructions (Signed)
Health Maintenance, Female Adopting a healthy lifestyle and getting preventive care are important in promoting health and wellness. Ask your health care provider about:  The right schedule for you to have regular tests and exams.  Things you can do on your own to prevent diseases and keep yourself healthy. What should I know about diet, weight, and exercise? Eat a healthy diet   Eat a diet that includes plenty of vegetables, fruits, low-fat dairy products, and lean protein.  Do not eat a lot of foods that are high in solid fats, added sugars, or sodium. Maintain a healthy weight Body mass index (BMI) is used to identify weight problems. It estimates body fat based on height and weight. Your health care provider can help determine your BMI and help you achieve or maintain a healthy weight. Get regular exercise Get regular exercise. This is one of the most important things you can do for your health. Most adults should:  Exercise for at least 150 minutes each week. The exercise should increase your heart rate and make you sweat (moderate-intensity exercise).  Do strengthening exercises at least twice a week. This is in addition to the moderate-intensity exercise.  Spend less time sitting. Even light physical activity can be beneficial. Watch cholesterol and blood lipids Have your blood tested for lipids and cholesterol at 57 years of age, then have this test every 5 years. Have your cholesterol levels checked more often if:  Your lipid or cholesterol levels are high.  You are older than 57 years of age.  You are at high risk for heart disease. What should I know about cancer screening? Depending on your health history and family history, you may need to have cancer screening at various ages. This may include screening for:  Breast cancer.  Cervical cancer.  Colorectal cancer.  Skin cancer.  Lung cancer. What should I know about heart disease, diabetes, and high blood  pressure? Blood pressure and heart disease  High blood pressure causes heart disease and increases the risk of stroke. This is more likely to develop in people who have high blood pressure readings, are of African descent, or are overweight.  Have your blood pressure checked: ? Every 3-5 years if you are 18-39 years of age. ? Every year if you are 40 years old or older. Diabetes Have regular diabetes screenings. This checks your fasting blood sugar level. Have the screening done:  Once every three years after age 40 if you are at a normal weight and have a low risk for diabetes.  More often and at a younger age if you are overweight or have a high risk for diabetes. What should I know about preventing infection? Hepatitis B If you have a higher risk for hepatitis B, you should be screened for this virus. Talk with your health care provider to find out if you are at risk for hepatitis B infection. Hepatitis C Testing is recommended for:  Everyone born from 1945 through 1965.  Anyone with known risk factors for hepatitis C. Sexually transmitted infections (STIs)  Get screened for STIs, including gonorrhea and chlamydia, if: ? You are sexually active and are younger than 57 years of age. ? You are older than 57 years of age and your health care provider tells you that you are at risk for this type of infection. ? Your sexual activity has changed since you were last screened, and you are at increased risk for chlamydia or gonorrhea. Ask your health care provider if   you are at risk.  Ask your health care provider about whether you are at high risk for HIV. Your health care provider may recommend a prescription medicine to help prevent HIV infection. If you choose to take medicine to prevent HIV, you should first get tested for HIV. You should then be tested every 3 months for as long as you are taking the medicine. Pregnancy  If you are about to stop having your period (premenopausal) and  you may become pregnant, seek counseling before you get pregnant.  Take 400 to 800 micrograms (mcg) of folic acid every day if you become pregnant.  Ask for birth control (contraception) if you want to prevent pregnancy. Osteoporosis and menopause Osteoporosis is a disease in which the bones lose minerals and strength with aging. This can result in bone fractures. If you are 65 years old or older, or if you are at risk for osteoporosis and fractures, ask your health care provider if you should:  Be screened for bone loss.  Take a calcium or vitamin D supplement to lower your risk of fractures.  Be given hormone replacement therapy (HRT) to treat symptoms of menopause. Follow these instructions at home: Lifestyle  Do not use any products that contain nicotine or tobacco, such as cigarettes, e-cigarettes, and chewing tobacco. If you need help quitting, ask your health care provider.  Do not use street drugs.  Do not share needles.  Ask your health care provider for help if you need support or information about quitting drugs. Alcohol use  Do not drink alcohol if: ? Your health care provider tells you not to drink. ? You are pregnant, may be pregnant, or are planning to become pregnant.  If you drink alcohol: ? Limit how much you use to 0-1 drink a day. ? Limit intake if you are breastfeeding.  Be aware of how much alcohol is in your drink. In the [place] one drink equals one 12 oz bottle of beer (355 mL), one 5 oz glass of wine (148 mL), or one 1 oz glass of hard liquor (44 mL). General instructions  Schedule regular health, dental, and eye exams.  Stay current with your vaccines.  Tell your health care provider if: ? You often feel depressed. ? You have ever been abused or do not feel safe at home. Summary  Adopting a healthy lifestyle and getting preventive care are important in promoting health and wellness.  Follow your health care provider's instructions about healthy  diet, exercising, and getting tested or screened for diseases.  Follow your health care provider's instructions on monitoring your cholesterol and blood pressure. This information is not intended to replace advice given to you by your health care provider. Make sure you discuss any questions you have with your health care provider. Document Released: 04/07/2011 Document Revised: 09/15/2018 Document Reviewed: 09/15/2018 Elsevier Patient Education  2020 Elsevier Inc.  

## 2019-05-09 NOTE — Progress Notes (Signed)
Patient ID: Mikayla Tucker, female  DOB: [birth year], 58 y.o.   MRN: [id number] Patient Care Team    Relationship Specialty Notifications Start End  Ma Hillock, DO PCP - General Family Medicine  08/07/15   Kennon Holter, NP Nurse Practitioner Nurse Practitioner  02/27/16    Comment: Gynecology  Gatha Mayer, MD Consulting Physician Gastroenterology  04/30/17     Chief Complaint  Patient presents with  . Annual Exam    No complaints. Pap smear 2017. Mammogram May 2019, Due. Not fasting.     Subjective:  Mikayla Tucker is a 58 y.o.  Female  present for CPE. All past medical history, surgical history, allergies, family history, immunizations, medications and social history were updated in the electronic medical record today. All recent labs, ED visits and hospitalizations within the last year were reviewed.  Health maintenance:  Colonoscopy: Completed 07/04/2016, 5 year recall. One colon polyp. Family history unknown (adopted). Completed by Dr. Carlean Purl. Mammogram: Completed 10/2017, patient reports normal. [facility] She will call to schedule at gyn  Cervical cancer screening: [facility]. Patient reports normal. She will call to schedule at gyn  Immunizations: tdap UTD completed 02/27/2016,flu UTD 2019, influenza UTD (encouraged yearly), Shingrix series  completed  Infectious disease screening: HIV and hepatitis C completed Assistive device: none Oxygen [id number] Patient has a Dental home. Hospitalizations/ED visits: reviewed  Depression screen [facility] 2/9 05/09/2019 06/03/2018 11/03/2017 04/29/2017  Decreased Interest 0 0 0 0  Down, Depressed, Hopeless 0 0 0 0  PHQ - 2 Score 0 0 0 0   GAD 7 : Generalized Anxiety Score 05/09/2019 06/03/2018  Nervous, Anxious, on Edge 0 0  Control/stop worrying 0 0  Worry too much - different things 0 0  Trouble relaxing 0 0  Restless 0 0  Easily annoyed or irritable 0  0  Afraid - awful might happen 0 0  Total GAD 7 Score 0 0  Anxiety Difficulty Not difficult at all -    Immunization History  Administered Date(s) Administered  . Influenza,inj,Quad PF,6+ Mos 06/13/2014, 11/12/2016, 06/03/2018  . Tdap 02/27/2016  . Zoster Recombinat (Shingrix) 06/03/2018, 08/20/2018     Past Medical History:  Diagnosis Date  . Allergy   . BPPV (benign paroxysmal positional vertigo)   . Hyperlipidemia   . Squamous cell skin cancer    SCC   Allergies  Allergen Reactions  . Hydrocodone-Acetaminophen Hives   Past Surgical History:  Procedure Laterality Date  . APPENDECTOMY    . BACK SURGERY    . BREAST SURGERY     biopsy  . CHOLECYSTECTOMY    . COLONOSCOPY    . LIPOMA EXCISION     under breast on R side   Family History  Adopted: Yes  Problem Relation Age of Onset  . Colon cancer Neg Hx    Social History   Social History Narrative   Ms. Milnes lives with her husband and daughter. She is originally from [place]-moved to [place]. She relocated to [facility] about [place] from [place].    Allergies as of 05/09/2019      Reactions   Hydrocodone-acetaminophen Hives      Medication List       Accurate as of May 09, 2019  2:01 PM. If you have any questions, ask your nurse or doctor.        STOP taking these medications   loperamide 2 MG  capsule Commonly known as: IMODIUM Stopped by: Howard Pouch, DO   naproxen 500 MG tablet Commonly known as: Naprosyn Stopped by: Howard Pouch, DO     TAKE these medications   atorvastatin 80 MG tablet Commonly known as: LIPITOR Take 1 tablet (80 mg total) by mouth daily.   b complex vitamins capsule Take 1 capsule by mouth daily.   Cholecalciferol 25 MCG (1000 UT) tablet Take 1,000 Units by mouth daily. 2 tabs daily   ezetimibe 10 MG tablet Commonly known as: Zetia Take 1 tablet (10 mg total) by mouth daily.   fexofenadine 180 MG tablet Commonly known as: ALLEGRA Take 180 mg by mouth daily.    fluticasone 50 MCG/ACT nasal spray Commonly known as: FLONASE Place 2 sprays into both nostrils daily.   LORazepam 1 MG tablet Commonly known as: ATIVAN Take 1 tablet (1 mg total) by mouth 2 (two) times daily as needed for anxiety.   Magnesium 250 MG Tabs Take 1 tablet by mouth.   traMADol 50 MG tablet Commonly known as: ULTRAM Take 50 mg by mouth. PRN for shoulder pain       All past medical history, surgical history, allergies, family history, immunizations andmedications were updated in the EMR today and reviewed under the history and medication portions of their EMR.     No results found.  ROS: 14 pt review of systems performed and negative (unless mentioned in an HPI)  Objective: BP 121/76 (BP Location: Right Arm, Patient Position: Sitting, Cuff Size: Normal)   Pulse 80   Temp 97.8 F (36.6 C) (Temporal)   Resp 16   Ht 5' 4.5" (1.638 m)   Wt 151 lb 4 oz (68.6 kg)   SpO2 98%   BMI 25.56 kg/m  Gen: Afebrile. No acute distress. Nontoxic in appearance, well-developed, well-nourished, pleasant, Caucasian female HENT: AT. [place]. Bilateral TM visualized and normal in appearance, normal external auditory canal. MMM, no oral lesions, adequate dentition. Bilateral nares within normal limits. Throat without erythema, ulcerations or exudates.  No cough on exam, no hoarseness on exam. Eyes:Pupils Equal Round Reactive to light, Extraocular movements intact,  Conjunctiva without redness, discharge or icterus. Neck/lymp/endocrine: Supple, no lymphadenopathy, no thyromegaly CV: RRR no murmur, no edema, +2/4 P posterior tibialis pulses.  No carotid bruits. No JVD. Chest: CTAB, no wheeze, rhonchi or crackles.  Normal respiratory effort.  Good air movement. Abd: Soft.  Flat. NTND. BS present.  No masses palpated. No hepatosplenomegaly. No rebound tenderness or guarding. Skin: No rashes, purpura or petechiae. Warm and well-perfused. Skin intact. Neuro/Msk:  Normal gait. PERLA. EOMi. Alert.  Oriented x3.  Cranial nerves II through XII intact. Muscle strength 5/5 upper/lower extremity. DTRs equal bilaterally. Psych: Normal affect, dress and demeanor. Normal speech. Normal thought content and judgment.   No exam data present  Assessment/plan: Mikayla Tucker is a 58 y.o. female present for CPE Mixed hyperlipidemia/Overweight (BMI 25.0-29.9) Refilled atorvastatin for her today.  Collected lipids, she is not fasting today. - Comprehensive metabolic panel - Lipid panel - TSH - atorvastatin (LIPITOR) 80 MG tablet; Take 1 tablet (80 mg total) by mouth daily.  Dispense: 90 tablet; Refill: 3 Macrocytosis without anemia - CBC  Prediabetes - Hemoglobin A1c Vitamin D deficiency - Vitamin D (25 hydroxy)  Encounter for preventive health examination Patient was encouraged to exercise greater than 150 minutes a week. Patient was encouraged to choose a diet filled with fresh fruits and vegetables, and lean meats. AVS provided to patient today for education/recommendation on  gender specific health and safety maintenance. Colonoscopy: Completed 07/04/2016, 5 year recall. One colon polyp. Family history unknown (adopted). Completed by Dr. Carlean Purl. Mammogram: Completed 10/2017, patient reports normal. Com[facility]. She will call to schedule at gyn  Cervical cancer screening: Completed [facility]/13/2016, [facility]. Patient reports normal. She will call to schedule at gyn  Immunizations: tdap UTD completed 02/27/2016,flu UTD 2019, influenza UTD (encouraged yearly), Shingrix series  completed  Infectious disease screening: HIV and hepatitis C completed   Return in about 1 year (around 05/08/2020) for CPE (30 min).  Electronically signed by: Howard Pouch, DO [place]

## 2019-05-10 ENCOUNTER — Ambulatory Visit: Payer: Self-pay | Admitting: Neurology

## 2019-05-10 ENCOUNTER — Telehealth: Payer: Self-pay | Admitting: Family Medicine

## 2019-05-10 DIAGNOSIS — D7589 Other specified diseases of blood and blood-forming organs: Secondary | ICD-10-CM

## 2019-05-10 LAB — COMPREHENSIVE METABOLIC PANEL
ALT: 32 U/L (ref 0–35)
AST: 22 U/L (ref 0–37)
Albumin: 4.6 g/dL (ref 3.5–5.2)
Alkaline Phosphatase: 92 U/L (ref 39–117)
BUN: 14 mg/dL (ref 6–23)
CO2: 27 mEq/L (ref 19–32)
Calcium: 10.2 mg/dL (ref 8.4–10.5)
Chloride: 100 mEq/L (ref 96–112)
Creatinine, Ser: 0.57 mg/dL (ref 0.40–1.20)
GFR: 109.19 mL/min (ref >60.00)
Glucose, Bld: 89 mg/dL (ref 70–99)
Potassium: 4 mEq/L (ref 3.5–5.1)
Sodium: 136 mEq/L (ref 135–145)
Total Bilirubin: 0.6 mg/dL (ref 0.2–1.2)
Total Protein: 7.2 g/dL (ref 6.0–8.3)

## 2019-05-10 LAB — LIPID PANEL
Cholesterol: 236 mg/dL — ABNORMAL HIGH (ref 0–200)
HDL: 78.6 mg/dL (ref >39.00)
LDL Cholesterol: 132 mg/dL — ABNORMAL HIGH (ref 0–99)
NonHDL: 157.83
Total CHOL/HDL Ratio: 3
Triglycerides: 127 mg/dL (ref 0.0–149.0)
VLDL: 25.4 mg/dL (ref 0.0–40.0)

## 2019-05-10 LAB — CBC
HCT: 40 % (ref 36.0–46.0)
Hemoglobin: 13.5 g/dL (ref 12.0–15.0)
MCHC: 33.7 g/dL (ref 30.0–36.0)
MCV: 104.4 fl — ABNORMAL HIGH (ref 78.0–100.0)
Platelets: 310 10*3/uL (ref 150.0–400.0)
RBC: 3.83 Mil/uL — ABNORMAL LOW (ref 3.87–5.11)
RDW: 12.8 % (ref 11.5–15.5)
WBC: 8.8 10*3/uL (ref 4.0–10.5)

## 2019-05-10 LAB — TSH: TSH: 1.1 u[IU]/mL (ref 0.35–4.50)

## 2019-05-10 LAB — HEMOGLOBIN A1C: Hgb A1c MFr Bld: 5.9 % (ref 4.6–6.5)

## 2019-05-10 LAB — VITAMIN D 25 HYDROXY (VIT D DEFICIENCY, FRACTURES): VITD: 43.62 ng/mL (ref 30.00–100.00)

## 2019-05-10 NOTE — Telephone Encounter (Signed)
Please inform patient the following information: - Her liver and kidney function is normal.   - Her cholesterol panel is good.  Her good cholesterol/HDL is great at 1.   -Her thyroid is functioning normal -She is still having larger sized blood cells but they are stable from prior collection.  She has a very mild decrease in her red blood cells, this is very mild but can be seen with the large blood cells.  This just means we have to continue monitoring her closely every 6 months. -Her diabetes screen/A1c is 5.9.  This is the upper limit of "normal "before prediabetes.  Getting back on track with her dietary habits as we discussed to help keep this in the normal range and prevent her from progressing to prediabetes.  Follow-up in 6 months on macrocytosis to monitor labs.

## 2019-05-11 NOTE — Telephone Encounter (Signed)
Patient notified of PCP recommendations and is agreement and expresses an understanding. Follow up appt scheduled.   [place] for [facility] to Discuss results / PCP recommendations / Schedule patient.

## 2019-05-13 ENCOUNTER — Encounter: Payer: Self-pay | Admitting: Family Medicine

## 2019-05-18 ENCOUNTER — Other Ambulatory Visit: Payer: Self-pay

## 2019-05-18 DIAGNOSIS — E785 Hyperlipidemia, unspecified: Secondary | ICD-10-CM

## 2019-05-18 DIAGNOSIS — Z1211 Encounter for screening for malignant neoplasm of colon: Secondary | ICD-10-CM

## 2019-05-18 DIAGNOSIS — R7303 Prediabetes: Secondary | ICD-10-CM

## 2019-05-18 DIAGNOSIS — Z0000 Encounter for general adult medical examination without abnormal findings: Secondary | ICD-10-CM

## 2019-05-18 DIAGNOSIS — E559 Vitamin D deficiency, unspecified: Secondary | ICD-10-CM

## 2019-05-18 DIAGNOSIS — Z6825 Body mass index (BMI) 25.0-25.9, adult: Secondary | ICD-10-CM

## 2019-05-18 NOTE — Telephone Encounter (Signed)
Pt called asking for refill on Ativan 1mg  tablet and stated she had just had her CPE and checked the pharmacy and it was not there.  RF request for Ativan  LOV: CPE 05/09/2019  Next ov: 11/22/2019 Last written: 04/29/2017 #60 x5 refills   Please advise

## 2019-05-19 NOTE — Telephone Encounter (Signed)
Patient called and would like to know the status of her medication requests since she hasnt had it for a week and needs to take it. Please return patients call, thanks.

## 2019-05-19 NOTE — Telephone Encounter (Signed)
Ativan is a controlled substance. She has been counseled in the past a controlled substance can not be refilled during a preventive (physcial) coded visit.  A controlled substance requires yearly drug screening, yearly controlled substance contract update and a face to face visit every 6 months for refills. This medication has not been prescribed for her since 2018. Unfortunately, that means she will need a visit in person- so we can get the required urine and contract updated.

## 2019-05-19 NOTE — Telephone Encounter (Signed)
Are we able to schedule this patient for a OV? She will need a OV to get this medication filled. Thank you!

## 2019-05-19 NOTE — Telephone Encounter (Signed)
Called patient to schedule a OV for Ativan refill. Explained to the patient that a OV and updated contract/UDS is required before refilling medication. Patient became upset and stated that she will be looking for another provider.

## 2019-05-20 NOTE — Telephone Encounter (Signed)
Patient can certainly search for another provider if that is what she desires.   However, I think there is a possibility she does not understand this is the law surrounding controlled substances. She has been counseled on this in the past and has signed a contract with [place] on 04/29/2017, which stated the requirements to remain on a controlled substance.  -This medication has not been prescribed by this provider since 04/29/2017.  -She never even mentioned this issue to this provider during her physical.   If she is going to search for another provider, please remove me as her PCP effective immediately.

## 2019-05-20 NOTE — Telephone Encounter (Signed)
LM requesting call back to discuss controlled substance policy.

## 2019-05-25 ENCOUNTER — Other Ambulatory Visit: Payer: Self-pay | Admitting: Orthopedic Surgery

## 2019-05-25 DIAGNOSIS — M25511 Pain in right shoulder: Secondary | ICD-10-CM

## 2019-05-25 NOTE — Telephone Encounter (Signed)
Attempted to contact patient, no answer.

## 2019-06-06 ENCOUNTER — Encounter: Payer: Commercial Managed Care - PPO | Admitting: Family Medicine

## 2019-06-15 ENCOUNTER — Other Ambulatory Visit: Payer: Self-pay | Admitting: Orthopedic Surgery

## 2019-06-21 ENCOUNTER — Ambulatory Visit
Admission: RE | Admit: 2019-06-21 | Discharge: 2019-06-21 | Disposition: A | Discharge status: Home / Self Care | Payer: Commercial Managed Care - PPO | Source: Ambulatory Visit | Attending: Orthopedic Surgery | Admitting: Orthopedic Surgery

## 2019-06-21 ENCOUNTER — Other Ambulatory Visit: Payer: Self-pay

## 2019-06-21 ENCOUNTER — Ambulatory Visit: Payer: Self-pay | Admitting: Neurology

## 2019-06-21 DIAGNOSIS — M25511 Pain in right shoulder: Secondary | ICD-10-CM

## 2019-06-29 ENCOUNTER — Ambulatory Visit (INDEPENDENT_AMBULATORY_CARE_PROVIDER_SITE_OTHER): Payer: Commercial Managed Care - PPO

## 2019-06-29 ENCOUNTER — Other Ambulatory Visit: Payer: Self-pay

## 2019-06-29 DIAGNOSIS — Z23 Encounter for immunization: Secondary | ICD-10-CM | POA: Diagnosis not present

## 2019-07-02 ENCOUNTER — Other Ambulatory Visit: Payer: Self-pay

## 2019-07-02 ENCOUNTER — Ambulatory Visit
Admission: RE | Admit: 2019-07-02 | Discharge: 2019-07-02 | Disposition: A | Discharge status: Home / Self Care | Payer: Commercial Managed Care - PPO | Source: Ambulatory Visit | Attending: Orthopedic Surgery | Admitting: Orthopedic Surgery

## 2019-07-22 ENCOUNTER — Telehealth: Payer: Self-pay

## 2019-07-22 MED ORDER — EZETIMIBE 10 MG PO TABS: 10.0000 mg | ORAL_TABLET | Freq: Every day | ORAL | 0 refills | Status: DC

## 2019-07-22 NOTE — Telephone Encounter (Signed)
Received [facility] from [facility] for refill on Ezetimbe 10mg  tablets.  LOV: 05/09/2019 Next ov: 11/22/2019 Last written: 06/04/2018 #90 x3 refills   Refilled and electronically sent the pharmacy. Per Dr Lucita Lora last note  Patient is to F/U in 6 months from 05/2019

## 2019-09-16 IMAGING — US US ABDOMEN COMPLETE
1 series · 13 of 25 positions shown · non-contrast
Comparison: None.

CLINICAL DATA: Elevated liver enzymes and macrocytosis

EXAM:
ABDOMEN ULTRASOUND COMPLETE

[Series 1: us abdomen complete · 0.24mm/px · 13 of 98 slices shown]
[im 1/98]
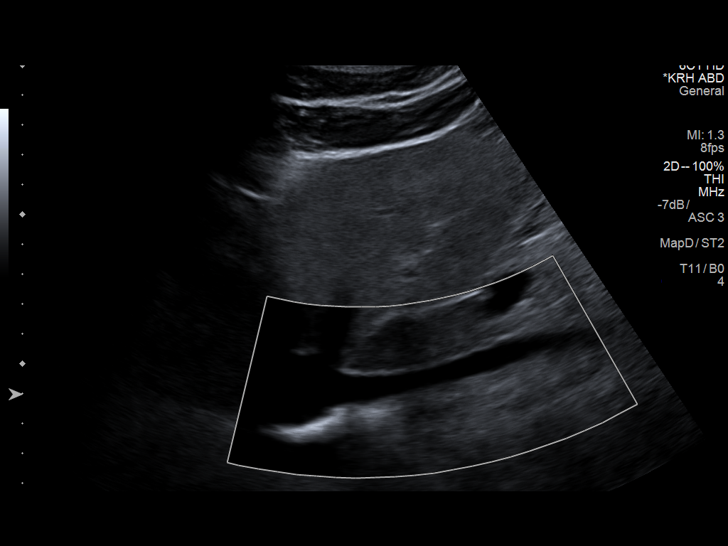
[im 9/98]
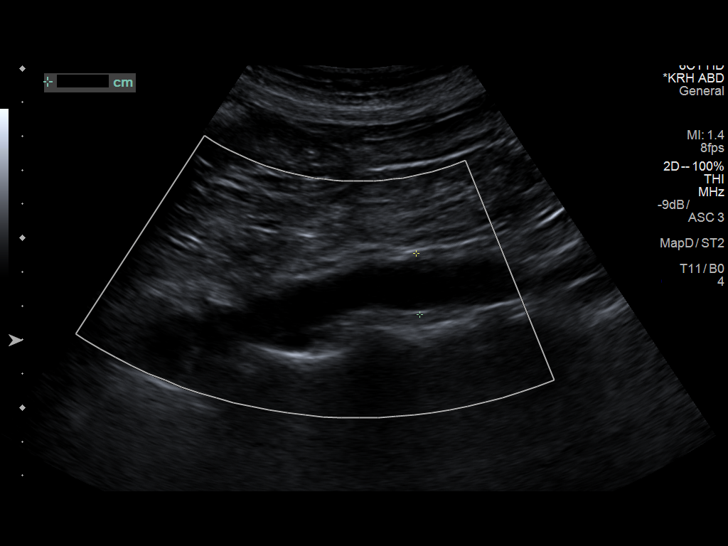
[im 17/98]
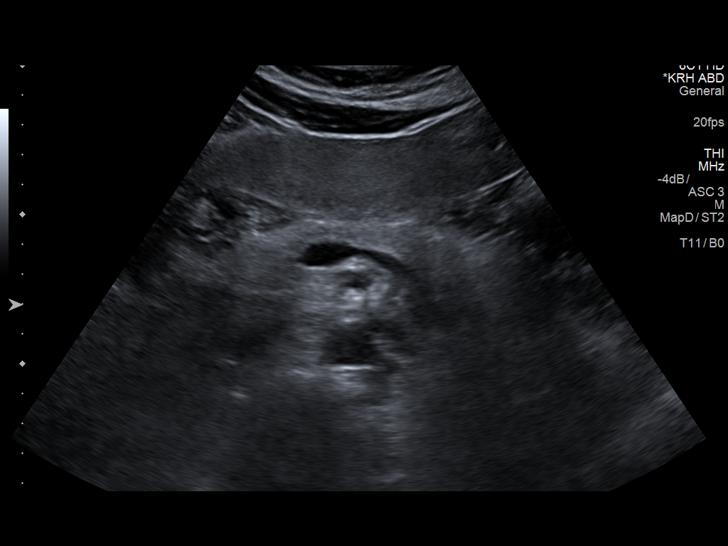
[im 25/98]
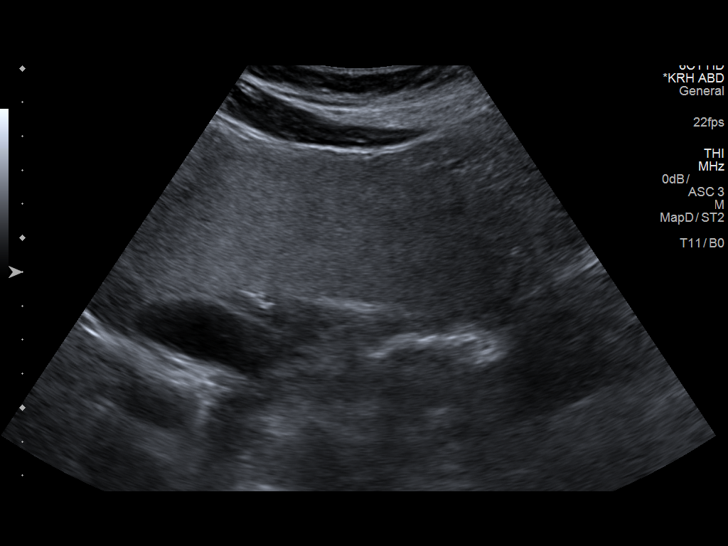
[im 33/98]
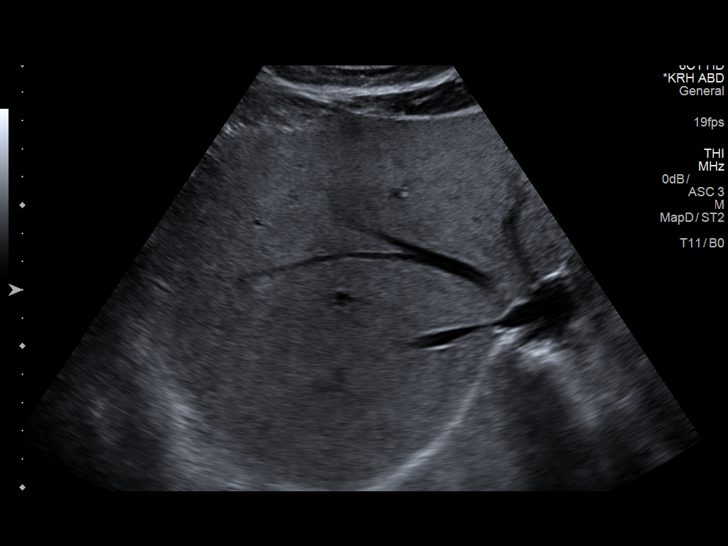
[im 41/98]
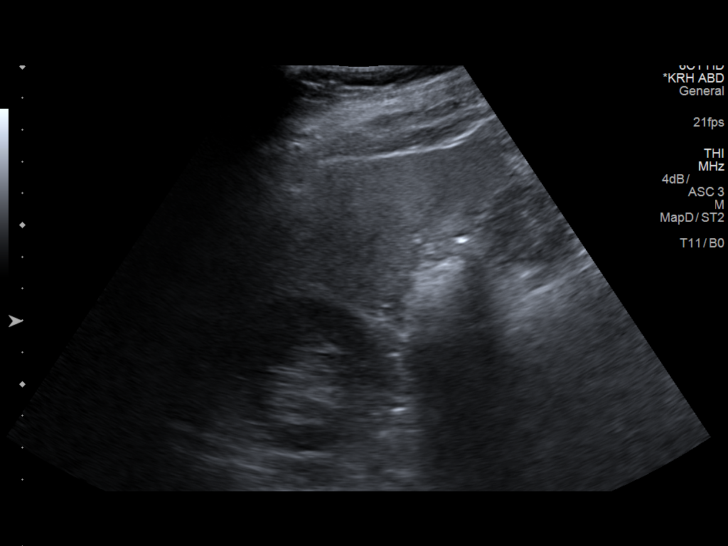
[im 49/98]
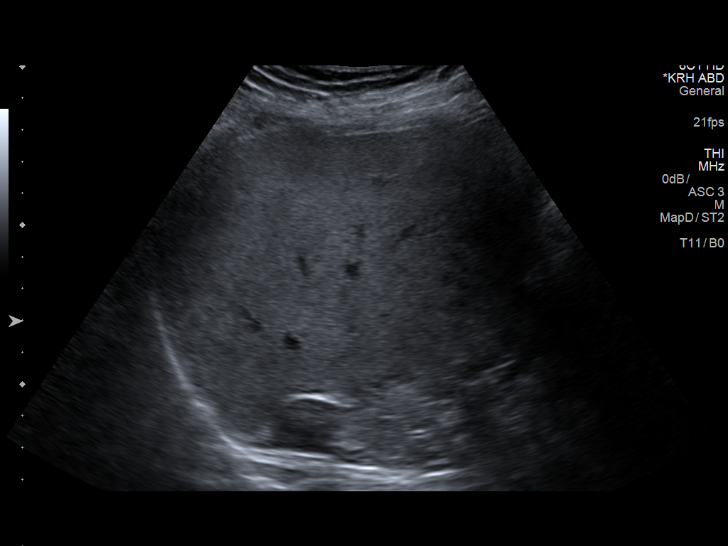
[im 57/98]
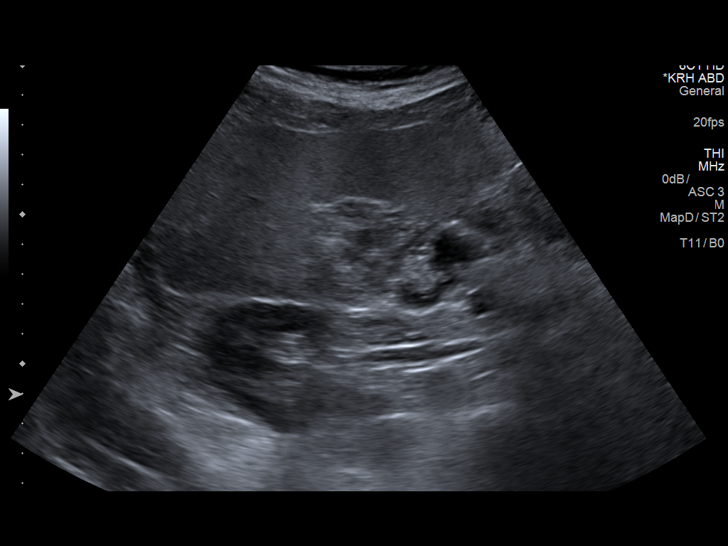
[im 65/98]
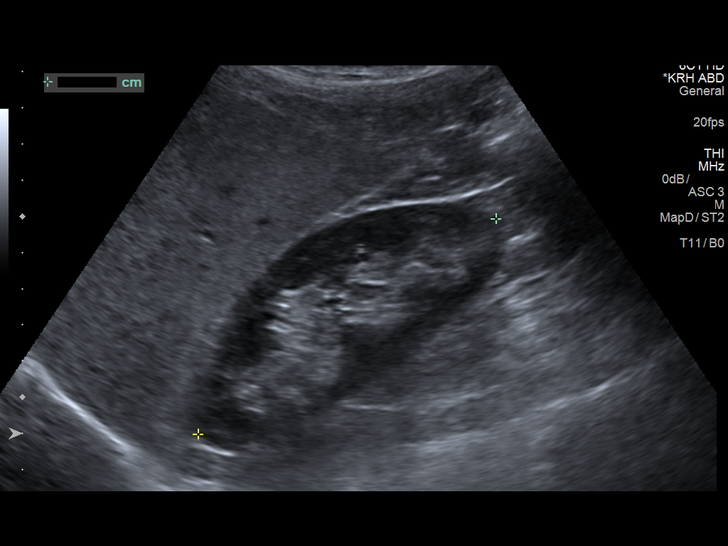
[im 73/98]
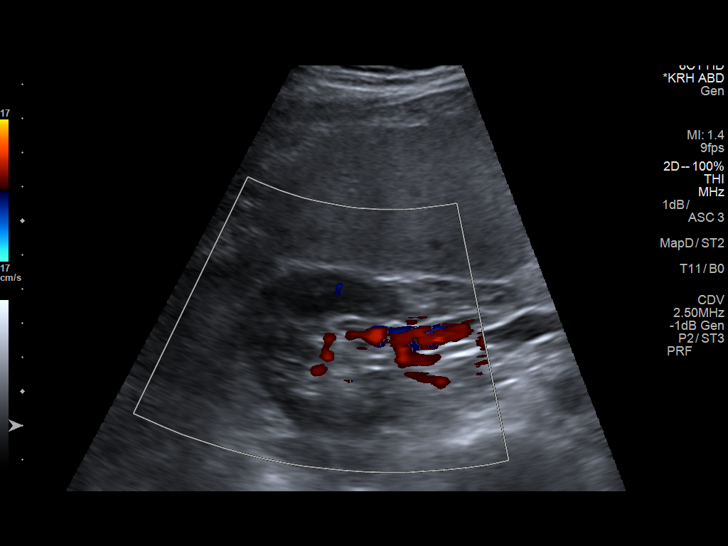
[im 81/98]
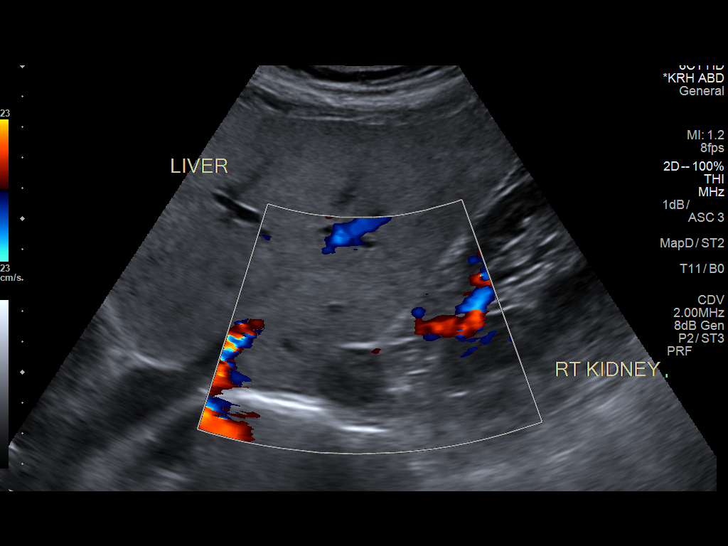
[im 89/98]
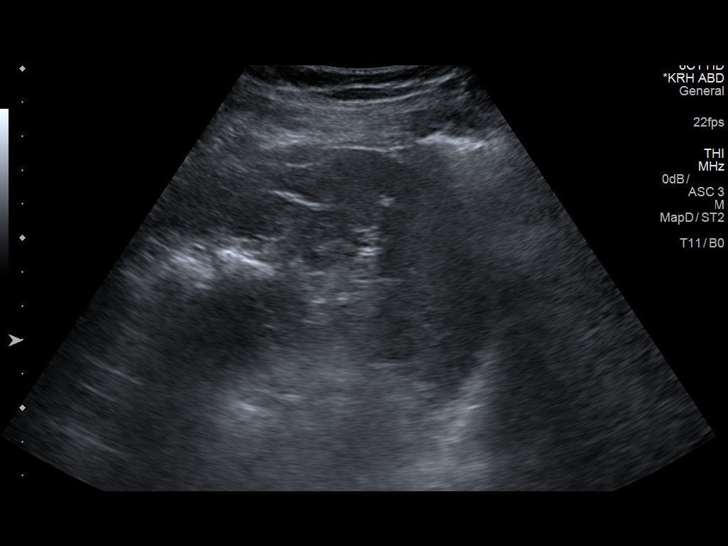
[im 98/98]
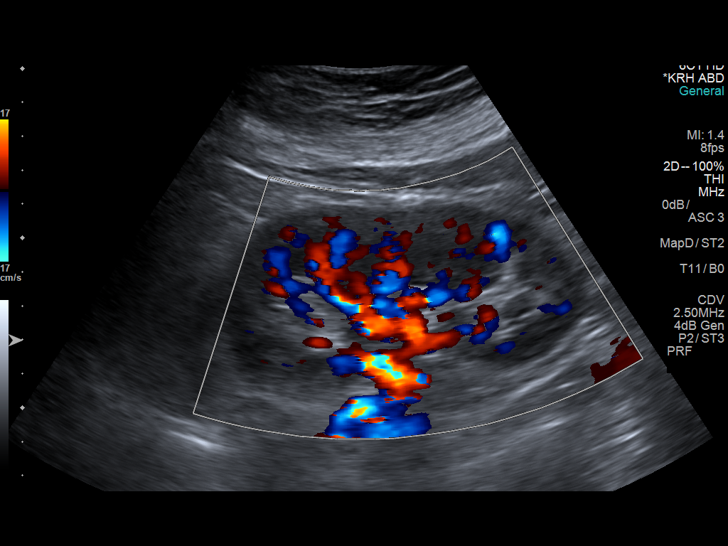

[13 of 25 positions shown; findings below may reference images not displayed]

FINDINGS: Gallbladder: Surgically absent.

Common bile duct: Diameter: 9 mm, within normal limits for post
cholecystectomy state. No evident biliary duct mass or calculus.

Liver: No focal lesion identified. Within normal limits in
parenchymal echogenicity. Portal vein is patent on color Doppler
imaging with normal direction of blood flow towards the liver.

IVC: No abnormality visualized.

Pancreas: No pancreatic mass or inflammatory focus.

Spleen: Size and appearance within normal limits.

Right Kidney: Length: 10.2 cm. Echogenicity within normal limits. No
mass or hydronephrosis visualized.

Left Kidney: Length: 10.8 cm. Echogenicity within normal limits. No
mass or hydronephrosis visualized.

Abdominal aorta: No aneurysm visualized.

Other findings: No demonstrable ascites. There is a hypoechoic mass
located between the liver and the right kidney measuring 2.6 x 2.1 x
2.5 cm.
IMPRESSION: 1. Hypoechoic 2.6 x 2.1 x 2.5 cm mass located between the liver and
right kidney. Suspect right adrenal mass. This finding warrants
adrenal MR to further assess. If there is a contraindication to MRI,
CT pre and post-contrast potentially could be helpful for further
assessment as well.

2. Gallbladder absent. Common bile duct measures 9 mm in diameter
which may be within normal limits for post cholecystectomy state.

3.  Study otherwise unremarkable.

## 2019-11-18 LAB — HM MAMMOGRAPHY

## 2019-11-22 ENCOUNTER — Ambulatory Visit: Payer: Commercial Managed Care - PPO | Admitting: Family Medicine

## 2020-02-28 ENCOUNTER — Telehealth: Payer: Self-pay

## 2020-02-28 MED ORDER — EZETIMIBE 10 MG PO TABS: 10.0000 mg | ORAL_TABLET | Freq: Every day | ORAL | 0 refills | Status: DC

## 2020-02-28 NOTE — Telephone Encounter (Signed)
Mikayla Tucker - Client Nonclinical Telephone Record  [facility] Client Mikayla Tucker - Client Client Site Mikayla Tucker Physician Raoul Pitch, [place] Type Call Who Is Calling Patient / Member / Family / Caregiver Caller Name Mysha Waner Caller Phone Number [phone] Patient Name Mikayla Tucker Patient DOB 25-[id number] Call Type Message Only Information Provided  Reason for Call Medication Question / Request Initial Comment Is out of her meds, [facility] but has not heard back from the office. Pls call back.  Additional Comment Disp. Time Disposition Final User 02/28/2020 7:45:06 AM General Information Provided Yes Donato Heinz Call Closed By: Donato Heinz Transaction Date/Time: 02/28/2020 7:43:17 AM (ET)

## 2020-02-28 NOTE — Addendum Note (Signed)
Addended by: Caroll Rancher L on: 02/28/2020 10:55 AM   Modules accepted: Orders

## 2020-02-28 NOTE — Telephone Encounter (Signed)
Pt needs refill on Zetia. Last checked and seen 05/2019 at yearly CPE. She did not want to make CPE appt at this time.

## 2020-05-20 ENCOUNTER — Telehealth: Payer: Self-pay | Admitting: Family Medicine

## 2020-05-21 MED ORDER — EZETIMIBE 10 MG PO TABS: 10.0000 mg | ORAL_TABLET | Freq: Every day | ORAL | 0 refills | Status: DC

## 2020-05-21 NOTE — Addendum Note (Signed)
Addended by: Howard Pouch A on: 05/21/2020 12:30 PM   Modules accepted: Orders

## 2020-05-21 NOTE — Telephone Encounter (Signed)
Please call patient: Received refill request on patient's Zetia. Patient was last seen a little over a year ago for her CPE.  She is due for her yearly physical. I have refilled her Zetia for her for 90 days.  Must have an appointment for additional refills.  Thanks.

## 2020-05-22 NOTE — Telephone Encounter (Signed)
Patient advised and scheduled CPE 9.21.21.

## 2020-05-25 ENCOUNTER — Other Ambulatory Visit: Payer: Self-pay

## 2020-05-25 ENCOUNTER — Encounter: Payer: Self-pay | Admitting: Family Medicine

## 2020-05-25 ENCOUNTER — Ambulatory Visit (INDEPENDENT_AMBULATORY_CARE_PROVIDER_SITE_OTHER): Payer: Commercial Managed Care - PPO | Admitting: Family Medicine

## 2020-05-25 VITALS — BP 132/83 | HR 71 | Temp 98.1°F | Resp 16 | Wt 157.0 lb

## 2020-05-25 DIAGNOSIS — M7702 Medial epicondylitis, left elbow: Secondary | ICD-10-CM

## 2020-05-25 DIAGNOSIS — E782 Mixed hyperlipidemia: Secondary | ICD-10-CM

## 2020-05-25 DIAGNOSIS — M7918 Myalgia, other site: Secondary | ICD-10-CM | POA: Diagnosis not present

## 2020-05-25 MED ORDER — MELOXICAM 15 MG PO TABS: 15.0000 mg | ORAL_TABLET | Freq: Every day | ORAL | 5 refills | Status: DC

## 2020-05-25 MED ORDER — ATORVASTATIN CALCIUM 80 MG PO TABS: 80.0000 mg | ORAL_TABLET | Freq: Every day | ORAL | 0 refills | Status: DC

## 2020-05-25 NOTE — Patient Instructions (Addendum)
Start mobic once daily with a meal.  Start PT at [facility]> they will call you. Purchase golfers elbow brace.  Follow after PT if symptoms still present or if PT worsens symptoms.   Piriformis Syndrome  Piriformis syndrome is a condition that can cause pain and numbness in your buttocks and down the back of your leg. Piriformis syndrome happens when the small muscle that connects the base of your spine to your hip (piriformis muscle) presses on the nerve that runs down the back of your leg (sciatic nerve). The piriformis muscle helps your hip rotate and helps to bring your leg back and out. It also helps shift your weight to keep you stable while you are walking. The sciatic nerve runs under or through the piriformis muscle. Damage to the piriformis muscle can cause spasms that put pressure on the nerve below. This causes pain and discomfort while sitting and moving. The pain may feel as if it begins in the buttock and spreads (radiates) down your hip and thigh. What are the causes? This condition is caused by pressure on the sciatic nerve from the piriformis muscle. The piriformis muscle can get irritated with overuse, especially if other hip muscles are weak and the piriformis muscle has to do extra work. Piriformis syndrome can also occur after an injury, like a fall onto your buttocks. What increases the risk? You are more likely to develop this condition if you:  Are a woman.  Sit for long periods of time.  Are a cyclist.  Have weak buttocks muscles (gluteal muscles). What are the signs or symptoms? Symptoms of this condition include:  Pain, tingling, or numbness that starts in the buttock and runs down the back of your leg (sciatica).  Pain in the groin or thigh area. Your symptoms may get worse:  The longer you sit.  When you walk, run, or climb stairs.  When straining to have a bowel movement. How is this diagnosed? This condition is diagnosed based on your symptoms, medical  history, and physical exam.  During the exam, your health care provider may: ? Move your leg into different positions to check for pain. ? Press on the muscles of your hip and buttock to see if that increases your symptoms.  You may also have tests, including: ? Imaging tests such as X-rays, MRI, or ultrasound. ? Electromyogram (EMG). This test measures electrical signals sent by your nerves into the muscles. ? Nerve conduction study. This test measures how well electrical signals pass through your nerves. How is this treated? This condition may be treated by:  Stopping all activities that cause pain or make your condition worse.  Applying ice or using heat therapy.  Taking medicines to reduce pain and swelling.  Taking a muscle relaxer (muscle relaxant) to stop muscle spasms.  Doing range-of-motion and strengthening exercises (physical therapy) as told by your health care provider.  Massaging the area.  Having acupuncture.  Getting an injection of medicine in the piriformis muscle. Your health care provider will choose the medicine based on your condition. He or she may inject: ? An anti-inflammatory medicine (steroid) to reduce swelling. ? A numbing medicine (local anesthetic) to block the pain. ? Botulinum toxin. The toxin blocks nerve impulses to specific muscles to reduce muscle tension. In rare cases, you may need surgery to cut the muscle and release pressure on the nerve if other treatments do not work. Follow these instructions at home: Activity  Do not sit for long periods. Get up  and walk around every 20 minutes or as often as told by your health care provider. ? When driving long distances, make sure to take frequent stops to get up and stretch.  Use a cushion when you sit on hard surfaces.  Do exercises as told by your health care provider.  Return to your normal activities as told by your health care provider. Ask your health care provider what activities are  safe for you. Managing pain, stiffness, and swelling      If directed, apply heat to the affected area as often as told by your health care provider. Use the heat source that your health care provider recommends, such as a moist heat pack or a heating pad. ? Place a towel between your skin and the heat source. ? Leave the heat on for 20-30 minutes. ? Remove the heat if your skin turns bright red. This is especially important if you are unable to feel pain, heat, or cold. You may have a greater risk of getting burned.  If directed, put ice on the injured area. ? Put ice in a plastic bag. ? Place a towel between your skin and the bag. ? Leave the ice on for 20 minutes, 2-3 times a day. General instructions  Take over-the-counter and prescription medicines only as told by your health care provider.  Ask your health care provider if the medicine prescribed to you requires you to avoid driving or using heavy machinery.  You may need to take actions to prevent or treat constipation, such as: ? Drink enough fluid to keep your urine pale yellow. ? Take over-the-counter or prescription medicines. ? Eat foods that are high in fiber, such as beans, whole grains, and fresh fruits and vegetables. ? Limit foods that are high in fat and processed sugars, such as fried or sweet foods.  Keep all follow-up visits as told by your health care provider. This is important. How is this prevented?  Do not sit for longer than 20 minutes at a time. When you sit, choose padded surfaces.  Warm up and stretch before being active.  Cool down and stretch after being active.  Give your body time to rest between periods of activity.  Make sure to use equipment that fits you.  Maintain physical fitness, including: ? Strength. ? Flexibility. Contact a health care provider if:  Your pain and stiffness continue or get worse.  Your leg or hip becomes weak.  You have changes in your bowel function or  bladder function. Summary  Piriformis syndrome is a condition that can cause pain, tingling, and numbness in your buttocks and down the back of your leg.  You may try applying heat or ice to relieve the pain.  Do not sit for long periods. Get up and walk around every 20 minutes or as often as told by your health care provider. This information is not intended to replace advice given to you by your health care provider. Make sure you discuss any questions you have with your health care provider. Document Revised: 01/13/2019 Document Reviewed: 05/19/2018 Elsevier Patient Education  [place].    Piriformis Syndrome Rehab Ask your health care provider which exercises are safe for you. Do exercises exactly as told by your health care provider and adjust them as directed. It is normal to feel mild stretching, pulling, tightness, or discomfort as you do these exercises. Stop right away if you feel sudden pain or your pain gets worse. Do not begin these  exercises until told by your health care provider. Stretching and range-of-motion exercises These exercises warm up your muscles and joints and improve the movement and flexibility of your hip and pelvis. The exercises also help to relieve pain, numbness, and tingling. Hip rotation This is an exercise in which you lie on your back and stretch the muscles that rotate your hip (hip rotators) to stretch your buttocks. 1. Lie on your back on a firm surface. 2. Pull your left / right knee toward your same shoulder with your left / right hand until your knee is pointing toward the ceiling. Hold your left / right ankle with your other hand. 3. Keeping your knee steady, gently pull your left / right ankle toward your other shoulder until you feel a stretch in your buttocks. 4. Hold this position for __________ seconds. Repeat __________ times. Complete this exercise __________ times a day. Hip extensor This is an exercise in which you lie on your  back and pull your knee to your chest. 1. Lie on your back on a firm surface. Both of your legs should be straight. 2. Pull your left / right knee to your chest. Hold your leg in this position by holding onto the back of your thigh or the front of your knee. 3. Hold this position for __________ seconds. 4. Slowly return to the starting position. Repeat __________ times. Complete this exercise __________ times a day. Strengthening exercises These exercises build strength and endurance in your hip and thigh muscles. Endurance is the ability to use your muscles for a long time, even after they get tired. Straight leg raises, side-lying This exercise strengthens the muscles that rotate the leg at the hip and move it away from your body (hip abductors). 1. Lie on your side with your left / right leg in the top position. Lie so your head, shoulder, knee, and hip line up. Bend your bottom knee to help you balance. 2. Lift your top leg 4-6 inches (10-15 cm) while keeping your toes pointed straight ahead. 3. Hold this position for __________ seconds. 4. Slowly lower your leg to the starting position. 5. Let your muscles relax completely after each repetition. Repeat __________ times. Complete this exercise __________ times a day. Hip abduction and rotation This is sometimes called quadruped (on hands and knees) exercises. 1. Get on your hands and knees on a firm, lightly padded surface. Your hands should be directly below your shoulders, and your knees should be directly below your hips. 2. Lift your left / right knee out to the side. Keep your knee bent. Do not twist your body. 3. Hold this position for __________ seconds. 4. Slowly lower your leg. Repeat __________ times. Complete this exercise __________ times a day. Straight leg raises, face-down This exercise stretches the muscles that move your hips away from the front of the pelvis (hip extensors). 1. Lie on your abdomen on a bed or a firm  surface with a pillow under your hips. 2. Squeeze your buttocks muscles and lift your left / right leg about 4-6 inches (10-15 cm) off the bed. Do not let your back arch. 3. Hold this position for __________ seconds. 4. Slowly lower your leg to the starting position. 5. Let your muscles relax completely after each repetition. Repeat __________ times. Complete this exercise __________ times a day. This information is not intended to replace advice given to you by your health care provider. Make sure you discuss any questions you have with your health care  provider. Document Revised: 01/13/2019 Document Reviewed: 07/15/2018 Elsevier Patient Education  [place].

## 2020-05-25 NOTE — Progress Notes (Signed)
This visit occurred during the SARS-CoV-2 public health emergency.  Safety protocols were in place, including screening questions prior to the visit, additional usage of staff PPE, and extensive cleaning of exam room while observing appropriate contact time as indicated for disinfecting solutions.    Mikayla Tucker , 03-06-1961, 59 y.o., female MRN: [id number] Patient Care Team    Relationship Specialty Notifications Start End  Ma Hillock, DO PCP - General Family Medicine  08/07/15   Kennon Holter, NP Nurse Practitioner Nurse Practitioner  02/27/16    Comment: Gynecology  Gatha Mayer, MD Consulting Physician Gastroenterology  04/30/17     Chief Complaint  Patient presents with  . Hip Pain     Subjective: Pt presents for an OV with complaints of right lateral hip discomfort for approximately 2 months and left elbow discomfort.  Patient reports her right lateral hip and buttocks will spasm and cause pain.  She reports even when she coughs she notices pain can be referred to this area.  It is tender to touch.  She has noticed increased pain with walking and certain movements.  She states she tried to get out of a car and had discomfort in this area.  She has not tried anything OTC.  Depression screen [facility] 2/9 05/09/2019 06/03/2018 11/03/2017 04/29/2017  Decreased Interest 0 0 0 0  Down, Depressed, Hopeless 0 0 0 0  PHQ - 2 Score 0 0 0 0    Allergies  Allergen Reactions  . Hydrocodone-Acetaminophen Hives   Social History   Social History Narrative   Mikayla Tucker lives with her husband and daughter. She is originally from [place]-moved to [place]. She relocated to [facility] about [place] from [place].   Past Medical History:  Diagnosis Date  . Allergy   . BPPV (benign paroxysmal positional vertigo)   . Hyperlipidemia   . Squamous cell skin cancer    SCC   Past Surgical History:  Procedure Laterality Date  . APPENDECTOMY    . BACK SURGERY    . BREAST SURGERY     biopsy  .  CHOLECYSTECTOMY    . COLONOSCOPY    . LIPOMA EXCISION     under breast on R side   Family History  Adopted: Yes  Problem Relation Age of Onset  . Colon cancer Neg Hx    Allergies as of 05/25/2020      Reactions   Hydrocodone-acetaminophen Hives      Medication List       Accurate as of May 25, 2020  3:42 PM. If you have any questions, ask your nurse or doctor.        atorvastatin 80 MG tablet Commonly known as: LIPITOR Take 1 tablet (80 mg total) by mouth daily.   b complex vitamins capsule Take 1 capsule by mouth daily.   Cholecalciferol 25 MCG (1000 UT) tablet Take 1,000 Units by mouth daily. 2 tabs daily   ezetimibe 10 MG tablet Commonly known as: ZETIA Take 1 tablet (10 mg total) by mouth daily.   fexofenadine 180 MG tablet Commonly known as: ALLEGRA Take 180 mg by mouth daily.   fluticasone 50 MCG/ACT nasal spray Commonly known as: FLONASE Place 2 sprays into both nostrils daily.   LORazepam 1 MG tablet Commonly known as: ATIVAN Take 1 tablet (1 mg total) by mouth 2 (two) times daily as needed for anxiety.   Magnesium 250 MG Tabs Take 1 tablet by mouth.  traMADol 50 MG tablet Commonly known as: ULTRAM Take 50 mg by mouth. PRN for shoulder pain       All past medical history, surgical history, allergies, family history, immunizations andmedications were updated in the EMR today and reviewed under the history and medication portions of their EMR.     ROS: Negative, with the exception of above mentioned in HPI   Objective:  BP 132/83 (BP Location: Right Arm, Patient Position: Sitting, Cuff Size: Normal)   Pulse 71   Temp 98.1 F (36.7 C) (Oral)   Resp 16   Wt 157 lb (71.2 kg)   SpO2 97%   BMI 26.53 kg/m  Body mass index is 26.53 kg/m. Gen: Afebrile. No acute distress. Nontoxic in appearance, well developed, well nourished.  HENT: AT. [place].  Eyes:Pupils Equal Round Reactive to light, Extraocular movements intact,  Conjunctiva without  redness, discharge or icterus. MSK:  Right hip without erythema.  Mild tenderness to palpation near right greater trochanter and posterior near gluteus muscle group.  Full range of motion of hip without discomfort.  Negative straight leg raise, negative FABRE.  Tender to palpation over piriformis.  Neurovascularly intact distally.  Muscle strength 5/5 lower extremity bilaterally with pain on resisted ADD duction of right foot Elbow: Left elbow without erythema.  No swelling.  Tender to palpation over medial epicondyle.  Negative Tinel's at elbow and wrist.  Mild discomfort with resisted pronation and supination.  Neurovascularly intact distally. Skin: no rashes, purpura or petechiae.  Neuro:  Normal gait. PERLA. EOMi. Alert. Oriented x3  Psych: Normal affect, dress and demeanor. Normal speech. Normal thought content and judgment.  No exam data present No results found. No results found for this or any previous visit (from the past 24 hour(s)).  Assessment/Plan: [name] is a 59 y.o. female present for OV for  Piriformis muscle pain Pain seems to be more consistent with piriformis syndrome/gluteus muscle discomfort.  Discussed physical therapy with her today and she is agreeable to start. Start Mobic 15 mg daily with meal. Provided her with piriformis syndrome rehab for home use - Ambulatory referral to Physical Therapy Follow-up as needed  Golfers elbow: Mobic start Encouraged her to purchase a golfers elbow brace and wear for 1-2 weeks. Follow-up as needed   Reviewed expectations re: course of current medical issues.  Discussed self-management of symptoms.  Outlined signs and symptoms indicating need for more acute intervention.  Patient verbalized understanding and all questions were answered.  Patient received an After-Visit Summary.    No orders of the defined types were placed in this encounter.  No orders of the defined types were placed in this  encounter.  Referral Orders  No referral(s) requested today     Note is dictated utilizing voice recognition software. Although note has been proof read prior to signing, occasional typographical errors still can be missed. If any questions arise, please do not hesitate to call for verification.   electronically signed by:  Howard Pouch, DO  [place]

## 2020-05-27 ENCOUNTER — Encounter: Payer: Self-pay | Admitting: Family Medicine

## 2020-05-27 DIAGNOSIS — M7702 Medial epicondylitis, left elbow: Secondary | ICD-10-CM | POA: Insufficient documentation

## 2020-05-27 HISTORY — DX: Medial epicondylitis, left elbow: M77.02

## 2020-05-31 ENCOUNTER — Telehealth: Payer: Self-pay

## 2020-05-31 NOTE — Telephone Encounter (Signed)
LM requesting call back.  

## 2020-05-31 NOTE — Telephone Encounter (Signed)
Patient states that Meloxicam is giving her bloody stools.  Please advise.  Is another med she can take?  Or is this a normal side effect of the med  Please call [phone]

## 2020-06-05 MED ORDER — PREDNISONE 20 MG PO TABS: ORAL_TABLET | ORAL | 0 refills | Status: DC

## 2020-06-05 NOTE — Telephone Encounter (Signed)
Patient advised of med recommendations.

## 2020-06-05 NOTE — Telephone Encounter (Signed)
I would advise she not take the mobic. Unfortunately, if she experienced bloody stools from mobic she can to any anti-inflammatory. We can call in a steroid taper for her.  This would help decrease the inflammation but is not a pain reliever like the Mobic.  She still might find it beneficial and overall be less painful after the inflammation is improved.  I have called that in for her. Tylenol can be taken for discomfort without causing bloody stools.

## 2020-06-05 NOTE — Telephone Encounter (Signed)
Spoke to pt and Beth (ORPT), referral was received and they will call to schedule with pt

## 2020-06-05 NOTE — Telephone Encounter (Signed)
Patient following up from call that was sent last week.  I told patient someone tried to contact her on 8/26 and lm requesting call back.  She is also inquiring about referral to [facility] PT.  Diane has closed order for [place] location.

## 2020-06-05 NOTE — Telephone Encounter (Signed)
Spoke with patient and she immediately stopped taking medication on 8/26. The medication was working well prior to having bloody stools. She would like to know if any alternatives.   Please advise, thanks.

## 2020-06-13 ENCOUNTER — Other Ambulatory Visit: Payer: Self-pay | Admitting: Family Medicine

## 2020-06-14 ENCOUNTER — Telehealth: Payer: Self-pay

## 2020-06-14 NOTE — Telephone Encounter (Signed)
Received fax for physical therapy initial evaluation. Placed on PCP desk for review/signature.

## 2020-06-18 NOTE — Telephone Encounter (Signed)
Completed and returned to CMA work basket ?

## 2020-06-26 ENCOUNTER — Encounter: Payer: Commercial Managed Care - PPO | Admitting: Family Medicine

## 2020-06-27 ENCOUNTER — Other Ambulatory Visit: Payer: Self-pay | Admitting: Family Medicine

## 2020-06-27 ENCOUNTER — Other Ambulatory Visit: Payer: Self-pay

## 2020-06-27 ENCOUNTER — Encounter: Payer: Self-pay | Admitting: Family Medicine

## 2020-06-27 ENCOUNTER — Ambulatory Visit (INDEPENDENT_AMBULATORY_CARE_PROVIDER_SITE_OTHER): Payer: Commercial Managed Care - PPO | Admitting: Family Medicine

## 2020-06-27 VITALS — BP 136/79 | HR 69 | Temp 98.5°F | Ht 64.0 in | Wt 159.0 lb

## 2020-06-27 DIAGNOSIS — D7589 Other specified diseases of blood and blood-forming organs: Secondary | ICD-10-CM

## 2020-06-27 DIAGNOSIS — Z23 Encounter for immunization: Secondary | ICD-10-CM

## 2020-06-27 DIAGNOSIS — E782 Mixed hyperlipidemia: Secondary | ICD-10-CM | POA: Diagnosis not present

## 2020-06-27 DIAGNOSIS — R7303 Prediabetes: Secondary | ICD-10-CM | POA: Diagnosis not present

## 2020-06-27 DIAGNOSIS — Z0000 Encounter for general adult medical examination without abnormal findings: Secondary | ICD-10-CM

## 2020-06-27 DIAGNOSIS — E559 Vitamin D deficiency, unspecified: Secondary | ICD-10-CM | POA: Diagnosis not present

## 2020-06-27 DIAGNOSIS — E663 Overweight: Secondary | ICD-10-CM

## 2020-06-27 LAB — COMPREHENSIVE METABOLIC PANEL
ALT: 50 U/L — ABNORMAL HIGH (ref 0–35)
AST: 32 U/L (ref 0–37)
Albumin: 4.6 g/dL (ref 3.5–5.2)
Alkaline Phosphatase: 95 U/L (ref 39–117)
BUN: 15 mg/dL (ref 6–23)
CO2: 28 mEq/L (ref 19–32)
Calcium: 9.7 mg/dL (ref 8.4–10.5)
Chloride: 96 mEq/L (ref 96–112)
Creatinine, Ser: 0.6 mg/dL (ref 0.40–1.20)
GFR: 102.5 mL/min (ref >60.00)
Glucose, Bld: 92 mg/dL (ref 70–99)
Potassium: 4.4 mEq/L (ref 3.5–5.1)
Sodium: 132 mEq/L — ABNORMAL LOW (ref 135–145)
Total Bilirubin: 0.8 mg/dL (ref 0.2–1.2)
Total Protein: 7.4 g/dL (ref 6.0–8.3)

## 2020-06-27 LAB — CBC
HCT: 41.2 % (ref 36.0–46.0)
Hemoglobin: 13.9 g/dL (ref 12.0–15.0)
MCHC: 33.8 g/dL (ref 30.0–36.0)
MCV: 105 fl — ABNORMAL HIGH (ref 78.0–100.0)
Platelets: 297 10*3/uL (ref 150.0–400.0)
RBC: 3.92 Mil/uL (ref 3.87–5.11)
RDW: 12.8 % (ref 11.5–15.5)
WBC: 7.4 10*3/uL (ref 4.0–10.5)

## 2020-06-27 LAB — LIPID PANEL
Cholesterol: 231 mg/dL — ABNORMAL HIGH (ref 0–200)
HDL: 69.4 mg/dL (ref >39.00)
LDL Cholesterol: 140 mg/dL — ABNORMAL HIGH (ref 0–99)
NonHDL: 161.38
Total CHOL/HDL Ratio: 3
Triglycerides: 107 mg/dL (ref 0.0–149.0)
VLDL: 21.4 mg/dL (ref 0.0–40.0)

## 2020-06-27 LAB — HEMOGLOBIN A1C: Hgb A1c MFr Bld: 6 % (ref 4.6–6.5)

## 2020-06-27 LAB — TSH: TSH: 1.35 u[IU]/mL (ref 0.35–4.50)

## 2020-06-27 LAB — VITAMIN D 25 HYDROXY (VIT D DEFICIENCY, FRACTURES): VITD: 48.78 ng/mL (ref 30.00–100.00)

## 2020-06-27 MED ORDER — EZETIMIBE 10 MG PO TABS: 10.0000 mg | ORAL_TABLET | Freq: Every day | ORAL | 0 refills | Status: DC

## 2020-06-27 MED ORDER — EZETIMIBE 10 MG PO TABS: 10.0000 mg | ORAL_TABLET | Freq: Every day | ORAL | 3 refills | Status: DC

## 2020-06-27 MED ORDER — ATORVASTATIN CALCIUM 80 MG PO TABS: 80.0000 mg | ORAL_TABLET | Freq: Every day | ORAL | 0 refills | Status: DC

## 2020-06-27 MED ORDER — ATORVASTATIN CALCIUM 80 MG PO TABS: 80.0000 mg | ORAL_TABLET | Freq: Every day | ORAL | 3 refills | Status: DC

## 2020-06-27 NOTE — Patient Instructions (Signed)
Health Maintenance, Female Adopting a healthy lifestyle and getting preventive care are important in promoting health and wellness. Ask your health care provider about:  The right schedule for you to have regular tests and exams.  Things you can do on your own to prevent diseases and keep yourself healthy. What should I know about diet, weight, and exercise? Eat a healthy diet   Eat a diet that includes plenty of vegetables, fruits, low-fat dairy products, and lean protein.  Do not eat a lot of foods that are high in solid fats, added sugars, or sodium. Maintain a healthy weight Body mass index (BMI) is used to identify weight problems. It estimates body fat based on height and weight. Your health care provider can help determine your BMI and help you achieve or maintain a healthy weight. Get regular exercise Get regular exercise. This is one of the most important things you can do for your health. Most adults should:  Exercise for at least 150 minutes each week. The exercise should increase your heart rate and make you sweat (moderate-intensity exercise).  Do strengthening exercises at least twice a week. This is in addition to the moderate-intensity exercise.  Spend less time sitting. Even light physical activity can be beneficial. Watch cholesterol and blood lipids Have your blood tested for lipids and cholesterol at 58 years of age, then have this test every 5 years. Have your cholesterol levels checked more often if:  Your lipid or cholesterol levels are high.  You are older than 58 years of age.  You are at high risk for heart disease. What should I know about cancer screening? Depending on your health history and family history, you may need to have cancer screening at various ages. This may include screening for:  Breast cancer.  Cervical cancer.  Colorectal cancer.  Skin cancer.  Lung cancer. What should I know about heart disease, diabetes, and high blood  pressure? Blood pressure and heart disease  High blood pressure causes heart disease and increases the risk of stroke. This is more likely to develop in people who have high blood pressure readings, are of African descent, or are overweight.  Have your blood pressure checked: ? Every 3-5 years if you are 18-39 years of age. ? Every year if you are 40 years old or older. Diabetes Have regular diabetes screenings. This checks your fasting blood sugar level. Have the screening done:  Once every three years after age 40 if you are at a normal weight and have a low risk for diabetes.  More often and at a younger age if you are overweight or have a high risk for diabetes. What should I know about preventing infection? Hepatitis B If you have a higher risk for hepatitis B, you should be screened for this virus. Talk with your health care provider to find out if you are at risk for hepatitis B infection. Hepatitis C Testing is recommended for:  Everyone born from 1945 through 1965.  Anyone with known risk factors for hepatitis C. Sexually transmitted infections (STIs)  Get screened for STIs, including gonorrhea and chlamydia, if: ? You are sexually active and are younger than 58 years of age. ? You are older than 58 years of age and your health care provider tells you that you are at risk for this type of infection. ? Your sexual activity has changed since you were last screened, and you are at increased risk for chlamydia or gonorrhea. Ask your health care provider if   you are at risk.  Ask your health care provider about whether you are at high risk for HIV. Your health care provider may recommend a prescription medicine to help prevent HIV infection. If you choose to take medicine to prevent HIV, you should first get tested for HIV. You should then be tested every 3 months for as long as you are taking the medicine. Pregnancy  If you are about to stop having your period (premenopausal) and  you may become pregnant, seek counseling before you get pregnant.  Take 400 to 800 micrograms (mcg) of folic acid every day if you become pregnant.  Ask for birth control (contraception) if you want to prevent pregnancy. Osteoporosis and menopause Osteoporosis is a disease in which the bones lose minerals and strength with aging. This can result in bone fractures. If you are 65 years old or older, or if you are at risk for osteoporosis and fractures, ask your health care provider if you should:  Be screened for bone loss.  Take a calcium or vitamin D supplement to lower your risk of fractures.  Be given hormone replacement therapy (HRT) to treat symptoms of menopause. Follow these instructions at home: Lifestyle  Do not use any products that contain nicotine or tobacco, such as cigarettes, e-cigarettes, and chewing tobacco. If you need help quitting, ask your health care provider.  Do not use street drugs.  Do not share needles.  Ask your health care provider for help if you need support or information about quitting drugs. Alcohol use  Do not drink alcohol if: ? Your health care provider tells you not to drink. ? You are pregnant, may be pregnant, or are planning to become pregnant.  If you drink alcohol: ? Limit how much you use to 0-1 drink a day. ? Limit intake if you are breastfeeding.  Be aware of how much alcohol is in your drink. In the [place] one drink equals one 12 oz bottle of beer (355 mL), one 5 oz glass of wine (148 mL), or one 1 oz glass of hard liquor (44 mL). General instructions  Schedule regular health, dental, and eye exams.  Stay current with your vaccines.  Tell your health care provider if: ? You often feel depressed. ? You have ever been abused or do not feel safe at home. Summary  Adopting a healthy lifestyle and getting preventive care are important in promoting health and wellness.  Follow your health care provider's instructions about healthy  diet, exercising, and getting tested or screened for diseases.  Follow your health care provider's instructions on monitoring your cholesterol and blood pressure. This information is not intended to replace advice given to you by your health care provider. Make sure you discuss any questions you have with your health care provider. Document Revised: 09/15/2018 Document Reviewed: 09/15/2018 Elsevier Patient Education  2020 Elsevier Inc.  

## 2020-06-27 NOTE — Progress Notes (Signed)
This visit occurred during the SARS-CoV-2 public health emergency.  Safety protocols were in place, including screening questions prior to the visit, additional usage of staff PPE, and extensive cleaning of exam room while observing appropriate contact time as indicated for disinfecting solutions.    Patient ID: Mikayla Tucker, female  DOB: [birth year], 59 y.o.   MRN: [id number] Patient Care Team    Relationship Specialty Notifications Start End  Ma Hillock, DO PCP - General Family Medicine  08/07/15   Kennon Holter, NP Nurse Practitioner Nurse Practitioner  02/27/16    Comment: Gynecology  Gatha Mayer, MD Consulting Physician Gastroenterology  04/30/17     Chief Complaint  Patient presents with  . Annual Exam    Pt is fasting; Colonoscopy 07/04/2016 Q5years; has obgyn Mammogram/PAP 10/15/17; Immunizations due for flu;     Subjective:  Mikayla Tucker is a 59 y.o.  Female  present for CPE. All past medical history, surgical history, allergies, family history, immunizations, medications and social history were updated in the electronic medical record today. All recent labs, ED visits and hospitalizations within the last year were reviewed.  Health maintenance:  Colonoscopy: Completed 07/04/2016, 5 year recall. One colon polyp. Family history unknown (adopted). Completed by Dr. Carlean Purl. Mammogram: Completed 10/2017, patient reports normal. [facility] She will call to schedule at gyn she did not last year 2/2 to covid pandemic.  Cervical cancer screening: Com[facility]13/[facility], [facility]. Patient reports normal. She will call to schedule at gyn  Immunizations: tdap UTD 02/27/2016,flu UTD 2021 today,, Shingrix series  completed , covid series completed Infectious disease screening: HIV and hepatitis C completed   Depression screen [facility] 2/9 06/27/2020 05/09/2019 06/03/2018 11/03/2017 04/29/2017  Decreased Interest 0 0 0 0 0  Down,  Depressed, Hopeless 0 0 0 0 0  PHQ - 2 Score 0 0 0 0 0   GAD 7 : Generalized Anxiety Score 05/09/2019 06/03/2018  Nervous, Anxious, on Edge 0 0  Control/stop worrying 0 0  Worry too much - different things 0 0  Trouble relaxing 0 0  Restless 0 0  Easily annoyed or irritable 0 0  Afraid - awful might happen 0 0  Total GAD 7 Score 0 0  Anxiety Difficulty Not difficult at all -    Immunization History  Administered Date(s) Administered  . Influenza,inj,Quad PF,6+ Mos 06/13/2014, 11/12/2016, 06/03/2018, 06/29/2019, 06/27/2020  . PFIZER SARS-COV-2 Vaccination 12/16/2019, 01/06/2020  . Tdap 02/27/2016  . Zoster Recombinat (Shingrix) 06/03/2018, 08/20/2018     Past Medical History:  Diagnosis Date  . Allergy   . BPPV (benign paroxysmal positional vertigo)   . Hyperlipidemia   . Squamous cell skin cancer    SCC   Allergies  Allergen Reactions  . Hydrocodone-Acetaminophen Hives   Past Surgical History:  Procedure Laterality Date  . APPENDECTOMY    . BACK SURGERY    . BREAST SURGERY     biopsy  . CHOLECYSTECTOMY    . COLONOSCOPY    . LIPOMA EXCISION     under breast on R side   Family History  Adopted: Yes  Problem Relation Age of Onset  . Colon cancer Neg Hx    Social History   Social History Narrative   Ms. Ahart lives with her husband and daughter. She is originally from [place]-moved to [place]. She relocated to [facility] about [place] from [place].    Allergies as of 06/27/2020  Reactions   Hydrocodone-acetaminophen Hives      Medication List       Accurate as of June 27, 2020 10:33 AM. If you have any questions, ask your nurse or doctor.        STOP taking these medications   meloxicam 15 MG tablet Commonly known as: MOBIC Stopped by: Howard Pouch, DO   predniSONE 20 MG tablet Commonly known as: DELTASONE Stopped by: Howard Pouch, DO     TAKE these medications   atorvastatin 80 MG tablet Commonly known as: LIPITOR Take 1 tablet (80 mg  total) by mouth daily.   b complex vitamins capsule Take 1 capsule by mouth daily.   ezetimibe 10 MG tablet Commonly known as: ZETIA Take 1 tablet (10 mg total) by mouth daily.   fexofenadine 180 MG tablet Commonly known as: ALLEGRA Take 180 mg by mouth daily.   LORazepam 1 MG tablet Commonly known as: ATIVAN Take 1 tablet (1 mg total) by mouth 2 (two) times daily as needed for anxiety.   Magnesium 250 MG Tabs Take 1 tablet by mouth.   Vitamin D (Cholecalciferol) 25 MCG (1000 UT) Tabs Take 2,000 Units by mouth. What changed: Another medication with the same name was removed. Continue taking this medication, and follow the directions you see here. Changed by: Howard Pouch, DO       All past medical history, surgical history, allergies, family history, immunizations andmedications were updated in the EMR today and reviewed under the history and medication portions of their EMR.     No results found for this or any previous visit (from the past 2160 hour(s)).   ROS: 14 pt review of systems performed and negative (unless mentioned in an HPI)  Objective: BP 136/79   Pulse 69   Temp 98.5 F (36.9 C) (Oral)   Ht '5\' 4"'  (1.626 m)   Wt 159 lb (72.1 kg)   SpO2 99%   BMI 27.29 kg/m  Gen: Afebrile. No acute distress. Nontoxic in appearance, well-developed, well-nourished,  Pleasant female.  HENT: AT. [place]. Bilateral TM visualized and normal in appearance, normal external auditory canal. MMM, no oral lesions, adequate dentition. Bilateral nares within normal limits. Throat without erythema, ulcerations or exudates. no Cough on exam, no hoarseness on exam. Eyes:Pupils Equal Round Reactive to light, Extraocular movements intact,  Conjunctiva without redness, discharge or icterus. Neck/lymp/endocrine: Supple,no lymphadenopathy, no thyromegaly CV: RRR no murmur, no edema, +2/4 P posterior tibialis pulses. No JVD. Chest: CTAB, no wheeze, rhonchi or crackles. normal Respiratory effort.  good Air movement. Abd: Soft. flat. NTND. BS present. no Masses palpated. No hepatosplenomegaly. No rebound tenderness or guarding. Skin: no rashes, purpura or petechiae. Warm and well-perfused. Skin intact. Neuro/Msk: Normal gait. PERLA. EOMi. Alert. Oriented x3.  Cranial nerves II through XII intact. Muscle strength 5/5 upper/lower extremity. DTRs equal bilaterally. Psych: Normal affect, dress and demeanor. Normal speech. Normal thought content and judgment.  No exam data present  Assessment/plan: Mikayla Tucker is a 59 y.o. female present for CPE Need for influenza vaccination administered today.  Mixed hyperlipidemia/overweight Has been stable. Continue zetia and lipitor.  - CBC - Comp Met (CMET) - TSH - Lipid panel - follow yearly  Macrocytosis without anemia CBC collected today  Has seen heme and only needs to follow again if other cell lines are abnormal.  Cbc q 6 mos follow up recommend by provider appt. If macrocytosis is present.  Vitamin D deficiency - Vitamin D (25 hydroxy) - supplementing 2000u qd  Prediabetes - Hemoglobin A1c  Encounter for preventive health examination Patient was encouraged to exercise greater than 150 minutes a week. Patient was encouraged to choose a diet filled with fresh fruits and vegetables, and lean meats. AVS provided to patient today for education/recommendation on gender specific health and safety maintenance. Colonoscopy: Completed 07/04/2016, 5 year recall. One colon polyp. Family history unknown (adopted). Completed by Dr. Carlean Purl. Mammogram: Completed 10/2017, patient reports normal. Com[facility]. She will call to schedule at gyn she did not last year 2/2 to covid pandemic.  Cervical cancer screening: Completed [facility]/13/[facility], [facility]. Patient reports normal. She will call to schedule at gyn  Immunizations: tdap UTD 02/27/2016,flu UTD 2021 today,, Shingrix series  completed , covid  series completed Infectious disease screening: HIV and hepatitis C completed  Return in about 1 year (around 06/27/2021) for CPE (30 min).   Orders Placed This Encounter  Procedures  . Flu Vaccine QUAD 6+ mos PF IM (Fluarix Quad PF)  . CBC  . Comp Met (CMET)  . TSH  . Hemoglobin A1c  . Lipid panel  . Vitamin D (25 hydroxy)   Meds ordered this encounter  Medications  . ezetimibe (ZETIA) 10 MG tablet    Sig: Take 1 tablet (10 mg total) by mouth daily.    Dispense:  90 tablet    Refill:  0    Please ca.ll the office for a follow up appointment for future refills  . atorvastatin (LIPITOR) 80 MG tablet    Sig: Take 1 tablet (80 mg total) by mouth daily.    Dispense:  90 tablet    Refill:  0   Referral Orders  No referral(s) requested today     Electronically signed by: Howard Pouch, [place]

## 2020-06-30 NOTE — Progress Notes (Signed)
Verified pt understanding  

## 2020-07-05 ENCOUNTER — Telehealth: Payer: Self-pay

## 2020-07-05 MED ORDER — DICLOFENAC SODIUM ER 100 MG PO TB24: 100.0000 mg | ORAL_TABLET | Freq: Every day | ORAL | 2 refills | Status: DC

## 2020-07-05 NOTE — Telephone Encounter (Signed)
We have discussed anti-inflammatories in general, not Celebrex by name.  Celebrex is not a medication I routinely prescribe due to its increased side effect profile.    She was prescribed Mobic for this condition and had bloody stools.  We then discussed anti-inflammatories again, by telephone note on 06/05/2020 and she was advised that any anti-inflammatory medication has the potential of causing bloody stools since the Mobic had caused bloody stools. Voltaren/diclofenac is a medication that can be used as an anti-inflammatory, but again may cause bloody stools for her.  I have called in this medicine.  It is a once daily tablet to be taken with food.  She has to be very cautious and if any signs of blood in the stool she needs to discontinue use immediately.  She should also avoid use of other anti-inflammatory such as Advil, Aleve, naproxen or Motrin routinely, when being prescribed diclofenac.   If she is not seeing improvement or has bloody stools and needs further intervention, she will need to make a follow-up appointment so that we can further evaluate other potential causes of her discomfort, such as stemming from lower back or hip.

## 2020-07-05 NOTE — Addendum Note (Signed)
Addended by: Howard Pouch A on: 07/05/2020 04:28 PM   Modules accepted: Orders

## 2020-07-05 NOTE — Telephone Encounter (Signed)
Patient is calling in asking if she can start Celebrex as she is not getting any relief from PT for her hip pain, Mikayla Tucker states she does not want to come in for another appointment as she has already discussed this twice now with Dr.Kuneff.

## 2020-07-05 NOTE — Telephone Encounter (Signed)
Please advise, pt wanting to start celebrex. Pt states that she has discussed it with you.

## 2020-07-12 ENCOUNTER — Ambulatory Visit: Payer: Commercial Managed Care - PPO | Admitting: Physical Therapy

## 2020-07-12 NOTE — Telephone Encounter (Signed)
LVM for pt to CB regarding prior message.  ?

## 2020-07-19 ENCOUNTER — Ambulatory Visit: Payer: Commercial Managed Care - PPO

## 2020-07-24 ENCOUNTER — Encounter: Payer: Commercial Managed Care - PPO | Admitting: Physical Therapy

## 2020-08-02 DIAGNOSIS — Z981 Arthrodesis status: Secondary | ICD-10-CM | POA: Insufficient documentation

## 2020-08-02 DIAGNOSIS — T819XXA Unspecified complication of procedure, initial encounter: Secondary | ICD-10-CM | POA: Insufficient documentation

## 2020-10-08 ENCOUNTER — Telehealth: Payer: Self-pay | Admitting: Family Medicine

## 2020-10-08 MED ORDER — DICLOFENAC SODIUM ER 100 MG PO TB24
100.0000 mg | ORAL_TABLET | Freq: Every day | ORAL | 1 refills | Status: DC
Start: 2020-10-08 — End: 2021-02-19

## 2020-10-08 NOTE — Telephone Encounter (Signed)
Patient requesting refill of diclofenac x 90 days. Please send to same [facility] in [facility].

## 2020-10-23 DIAGNOSIS — M5116 Intervertebral disc disorders with radiculopathy, lumbar region: Secondary | ICD-10-CM | POA: Insufficient documentation

## 2020-10-26 ENCOUNTER — Telehealth: Payer: Self-pay

## 2020-10-26 NOTE — Telephone Encounter (Signed)
Do not recall seeing MRI results please advise

## 2020-10-26 NOTE — Telephone Encounter (Signed)
FYI  Patient was told to follow up with PCP as soon as possible regarding her recent results from [facility] on 1/10.  She had a follow up visit with [facility] provider on 1/18.  I scheduled patient an appt on Monday 1/24 with Dr. Raoul Pitch.  Just making sure we have MRI results for her appt on Monday.  No need to follow up with patient unless needed.

## 2020-10-26 NOTE — Telephone Encounter (Signed)
Have not received the office visit note from [facility] for 10/15/2020 or the MRI result.  Please ensure these are faxed to [place] prior to patient's appointment, otherwise we would need to reschedule patient.   Also, patient has a copy of her MRI results, then she can print it off and bring it to [place].

## 2020-10-29 ENCOUNTER — Encounter: Payer: Self-pay | Admitting: Family Medicine

## 2020-10-29 ENCOUNTER — Telehealth: Payer: Self-pay

## 2020-10-29 ENCOUNTER — Ambulatory Visit (INDEPENDENT_AMBULATORY_CARE_PROVIDER_SITE_OTHER): Payer: Commercial Managed Care - PPO | Admitting: Family Medicine

## 2020-10-29 ENCOUNTER — Other Ambulatory Visit: Payer: Self-pay

## 2020-10-29 VITALS — BP 134/82 | HR 94 | Temp 98.2°F | Resp 18 | Wt 158.6 lb

## 2020-10-29 DIAGNOSIS — D3501 Benign neoplasm of right adrenal gland: Secondary | ICD-10-CM

## 2020-10-29 NOTE — Progress Notes (Signed)
This visit occurred during the SARS-CoV-2 public health emergency.  Safety protocols were in place, including screening questions prior to the visit, additional usage of staff PPE, and extensive cleaning of exam room while observing appropriate contact time as indicated for disinfecting solutions.    Mikayla Tucker , 03-31-1961, 60 y.o., female MRN: [id number] Patient Care Team    Relationship Specialty Notifications Start End  Ma Hillock, DO PCP - General Family Medicine  08/07/15   Kennon Holter, NP Nurse Practitioner Nurse Practitioner  02/27/16    Comment: Gynecology  Gatha Mayer, MD Consulting Physician Gastroenterology  04/30/17     Chief Complaint  Patient presents with  . Follow-up    Due to recent MRI reuslts per [facility] Pt says she wants better understanding and asks if she has reason for concern. She asks what the next steps are.      Subjective: Pt presents for an OV today to discuss incidental find on her lumbar MRI through [facility].  Unfortunately, we were not provided with a copy of her MRI results and patient does not have a copy with her today.  Patient states she was told it was an adenoma.  Patient has a history of right adrenal adenoma that has been stable since at least 2019 per EMR review.  Last measured 11/25/2018 with right adrenal adenoma 2.0 x 1.6 cm.  Last CT abdomen pelvis 11/25/2018: Adrenals/Urinary Tract: Left adrenal appears normal. There is a stable apparent right adrenal adenoma measuring 2.0 x 1.6 cm. Stable right adrenal adenoma, a benign finding.  Depression screen [facility] 2/9 06/27/2020 05/09/2019 06/03/2018 11/03/2017 04/29/2017  Decreased Interest 0 0 0 0 0  Down, Depressed, Hopeless 0 0 0 0 0  PHQ - 2 Score 0 0 0 0 0    Allergies  Allergen Reactions  . Hydrocodone-Acetaminophen Hives   Social History   Social History Narrative   Mikayla Tucker lives with her husband and daughter. She is originally from [place]-moved to [place]. She  relocated to [facility] about [place] from [place].   Past Medical History:  Diagnosis Date  . Allergy   . BPPV (benign paroxysmal positional vertigo)   . Hyperlipidemia   . Squamous cell skin cancer    SCC   Past Surgical History:  Procedure Laterality Date  . APPENDECTOMY    . BACK SURGERY    . BREAST SURGERY     biopsy  . CHOLECYSTECTOMY    . COLONOSCOPY    . LIPOMA EXCISION     under breast on R side   Family History  Adopted: Yes  Problem Relation Age of Onset  . Colon cancer Neg Hx    Allergies as of 10/29/2020      Reactions   Hydrocodone-acetaminophen Hives      Medication List       Accurate as of October 29, 2020 11:51 AM. If you have any questions, ask your nurse or doctor.        atorvastatin 80 MG tablet Commonly known as: LIPITOR Take 1 tablet (80 mg total) by mouth daily.   b complex vitamins capsule Take 1 capsule by mouth daily.   diazepam 5 MG tablet Commonly known as: VALIUM Take 5 mg by mouth 2 (two) times daily as needed.   Diclofenac Sodium CR 100 MG 24 hr tablet Take 1 tablet (100 mg total) by mouth daily.   ezetimibe 10 MG tablet Commonly known as: ZETIA Take 1 tablet (  10 mg total) by mouth daily.   fexofenadine 180 MG tablet Commonly known as: ALLEGRA Take 180 mg by mouth daily.   gabapentin 300 MG capsule Commonly known as: NEURONTIN Take 300 mg by mouth daily.   LORazepam 1 MG tablet Commonly known as: ATIVAN Take 1 tablet (1 mg total) by mouth 2 (two) times daily as needed for anxiety.   Magnesium 250 MG Tabs Take 1 tablet by mouth.   methylPREDNISolone 4 MG Tbpk tablet Commonly known as: MEDROL DOSEPAK Take by mouth.   Vitamin D (Cholecalciferol) 25 MCG (1000 UT) Tabs Take 2,000 Units by mouth.       All past medical history, surgical history, allergies, family history, immunizations andmedications were updated in the EMR today and reviewed under the history and medication portions of their EMR.     ROS:  Negative, with the exception of above mentioned in HPI   Objective:  BP 134/82 (BP Location: Left Arm, Patient Position: Sitting, Cuff Size: Normal)   Pulse 94   Temp 98.2 F (36.8 C) (Oral)   Resp 18   Wt 158 lb 9.6 oz (71.9 kg)   SpO2 97%   BMI 27.22 kg/m  Body mass index is 27.22 kg/m. Gen: Afebrile. No acute distress. Nontoxic in appearance, well developed, well nourished.  HENT: AT. Mikayla Tucker.  Eyes:Pupils Equal Round Reactive to light, Extraocular movements intact,  Conjunctiva without redness, discharge or icterus. Neuro:  Normal gait. PERLA. EOMi. Alert. Oriented x3  Psych: Normal affect, dress and demeanor. Normal speech. Normal thought content and judgment.  No exam data present No results found. No results found for this or any previous visit (from the past 24 hour(s)).  Assessment/Plan: Mikayla Tucker is a 60 y.o. female present for OV for  Adrenal adenoma, right Unfortunately we were not provided with copy of MRI results to ensure they are speaking of her adrenal adenoma.  We have attempted to call the office x3 to obtain records without great success.  We will continue to contact office and have MRI results faxed over to [place].  Once received I will compare the MRI result to prior CT abdomen image and call patient with recommendations. She was reassured today that if there is no changes to the size or characteristics of the adenoma, these are benign findings and no need to worry.   Reviewed expectations re: course of current medical issues.  Discussed self-management of symptoms.  Outlined signs and symptoms indicating need for more acute intervention.  Patient verbalized understanding and all questions were answered.  Patient received an After-Visit Summary.    No orders of the defined types were placed in this encounter.  No orders of the defined types were placed in this encounter.  Referral Orders  No referral(s) requested today     Note is dictated utilizing  voice recognition software. Although note has been proof read prior to signing, occasional typographical errors still can be missed. If any questions arise, please do not hesitate to call for verification.   electronically signed by:  Howard Pouch, DO  [place]

## 2020-10-29 NOTE — Telephone Encounter (Signed)
I called [facility] about 10 times today and eventually left medical records a voicemail about the urgency of [place] receiving the MRI results. I also faxed over a medical release form.

## 2020-10-29 NOTE — Telephone Encounter (Signed)
Thanks.  We will continue to look out for these results.

## 2020-10-29 NOTE — Patient Instructions (Signed)
Diagnostic Ultrasound (5th ed., pp. 416-431). Elsevier."> Grainger &amp; Allison's Diagnostic Radiology (7th ed., pp. 938-966). Elsevier.">  Adrenal Adenoma  An adrenal adenoma is a benign tumor of the glands that are located on top of each kidney (adrenal glands). These glands produce hormones. A benign tumor means that the growth is not cancer. A person may have one or more tumors in one or both glands. In almost all cases, adrenal adenomas do not cause any symptoms. These are called nonfunctional adenomas. In rare cases, an adenoma may produce high levels of hormones called cortisol or aldosterone. These tumors are called functional adenomas. Adrenal adenomas become more common as people grow older, but are unlikely to become cancerous. However, nonfunctional adenomas may become functional. What are the causes? In most cases, the cause of this condition is not known. In very rare cases, the condition may be passed from parent to child (inherited). Smoking and tobacco use is associated with significant increases in adrenal adenomas. What are the signs or symptoms? Symptoms of this condition depend on the type of adrenal adenoma that you have.  Nonfunctional adrenal adenomas usually do not cause any symptoms.  Symptoms of functional adrenal adenomas depend on which hormone is produced in high levels. ? Tumors that secrete cortisol cause a condition called Cushing's syndrome. Signs and symptoms include:  Increased fat in the upper body.  Tiredness and loss of energy.  Muscle weakness.  High blood pressure.  High blood sugar.  Bruising and purple stretch marks in the skin, usually on the upper body.  Facial hair, acne, and menstrual irregularities in women. ? Tumors that secrete aldosterone cause a condition called primary aldosteronism. Signs and symptoms include:  High blood pressure that may be difficult to control.  Tiredness and loss of energy.  Headache.  Weakness or  numbness.  Low potassium levels in your blood. How is this diagnosed? This condition may be diagnosed based on:  Your symptoms. Your health care provider may suspect the condition if you have signs and symptoms of a functional adenoma.  A physical exam.  Blood and urine tests to check for high levels of hormones.  Imaging studies to confirm the diagnosis. These may include: ? CT scan. ? MRI. ? PET scan.  Biopsy. For this test, a sample of the tumor is removed and examined in a lab. This is done in rare cases where other tests have not given a clear result. Adrenal adenomas are often found by chance when imaging studies of the abdomen are done for other reasons. How is this treated? Treatment for this condition depends on the type of the adrenal adenoma that you have. You may treated with:  Observation. This is done if you have a nonfunctional adrenal adenoma. For observation, you may need: ? Regular imaging studies to make sure the tumor is not growing. ? Blood or urine tests to make sure the tumor is not becoming functional.  Surgery. This is done if you have a functional adenoma. Surgery is the main treatment for this condition and usually cures it.  Medicines. These are used if surgery is not possible. The medicines block the effects of the hormones. Follow these instructions at home:  Take over-the-counter and prescription medicines only as told by your health care provider.  Return to your normal activities as told by your health care provider. Ask your health care provider what activities are safe for you.  Do not use any products that contain nicotine or tobacco. These products include cigarettes,   chewing tobacco, and vaping devices, such as e-cigarettes. If you need help quitting, ask your health care provider.  Keep all follow-up visits. This is important. This may include visits for regular tests and imaging studies. Contact a health care provider if:  You develop  any of the signs or symptoms of Cushing's syndrome or primary aldosteronism.  You need help to stop smoking or using other tobacco products. Summary  An adrenal adenoma is a benign tumor of the adrenal gland.  Nonfunctional adenomas rarely cause symptoms and do not need to be treated.  Functional adenomas produce hormones and may cause symptoms of Cushing's syndrome or primary aldosteronism, depending on the type of hormone they produce.  Adrenal adenomas do not become cancerous. Nonfunctional adenomas may become functional.  Surgery to remove the tumor is the usual treatment for functional adenomas. This information is not intended to replace advice given to you by your health care provider. Make sure you discuss any questions you have with your health care provider. Document Revised: 05/22/2020 Document Reviewed: 05/22/2020 Elsevier Patient Education  2021 Elsevier Inc.  

## 2020-10-30 NOTE — Telephone Encounter (Signed)
Pt had visit with provider 1/24

## 2020-10-31 ENCOUNTER — Telehealth: Payer: Self-pay | Admitting: Family Medicine

## 2020-10-31 DIAGNOSIS — D3501 Benign neoplasm of right adrenal gland: Secondary | ICD-10-CM

## 2020-10-31 NOTE — Telephone Encounter (Signed)
Spoke with pt regarding labs and instructions.   

## 2020-10-31 NOTE — Telephone Encounter (Signed)
Please inform pt we received the MRI result from [facility] and the abnormality identified was the known adrenal adenoma (right) that has been stable for a few years for her.  No concerns at this time. No follow up needed. Per last 3 reports it appears as a benign finding (not worrisome).

## 2020-12-24 ENCOUNTER — Telehealth: Payer: Self-pay | Admitting: Family Medicine

## 2020-12-24 NOTE — Telephone Encounter (Signed)
Patient states she is taking the diclofenac bid (was prescribed qd) due to increased back pain. She states she has an upcoming appt with ortho and believes she will end up having back surgery. Patient is requesting RX update and refills. She also asked if Dr. Raoul Pitch could prescribe something else instead that is stronger.

## 2020-12-24 NOTE — Telephone Encounter (Signed)
Spoke with pt regarding back pain meds. Pt was encourage to continue to follow with ortho tomorrow at her appointment since they are managing her back pain.   FYI

## 2021-02-06 ENCOUNTER — Telehealth: Payer: Self-pay

## 2021-02-06 NOTE — Telephone Encounter (Signed)
Please advise on change in sig

## 2021-02-06 NOTE — Telephone Encounter (Signed)
Spoke with pt regarding provider's instructions.  

## 2021-02-06 NOTE — Telephone Encounter (Signed)
No she can not take it BID. It is a qd 24 hr tablet (slow release).

## 2021-02-06 NOTE — Telephone Encounter (Signed)
Patient would like to know if she can take her medication morning and evening?  Diclofenac Sodium CR 100 MG 24 hr tablet [[id number]]   Patient can be reached at [phone]

## 2021-02-13 ENCOUNTER — Ambulatory Visit: Payer: Commercial Managed Care - PPO | Admitting: Family Medicine

## 2021-02-13 DIAGNOSIS — E782 Mixed hyperlipidemia: Secondary | ICD-10-CM

## 2021-02-13 DIAGNOSIS — E559 Vitamin D deficiency, unspecified: Secondary | ICD-10-CM

## 2021-02-13 DIAGNOSIS — E663 Overweight: Secondary | ICD-10-CM

## 2021-02-13 DIAGNOSIS — D7589 Other specified diseases of blood and blood-forming organs: Secondary | ICD-10-CM

## 2021-02-13 DIAGNOSIS — R7303 Prediabetes: Secondary | ICD-10-CM

## 2021-02-19 ENCOUNTER — Other Ambulatory Visit: Payer: Self-pay

## 2021-02-19 ENCOUNTER — Ambulatory Visit (INDEPENDENT_AMBULATORY_CARE_PROVIDER_SITE_OTHER): Payer: Commercial Managed Care - PPO | Admitting: Family Medicine

## 2021-02-19 ENCOUNTER — Encounter: Payer: Self-pay | Admitting: Family Medicine

## 2021-02-19 VITALS — BP 127/80 | HR 80 | Temp 98.1°F | Ht 64.0 in | Wt 155.0 lb

## 2021-02-19 DIAGNOSIS — F411 Generalized anxiety disorder: Secondary | ICD-10-CM

## 2021-02-19 DIAGNOSIS — E663 Overweight: Secondary | ICD-10-CM | POA: Diagnosis not present

## 2021-02-19 DIAGNOSIS — E559 Vitamin D deficiency, unspecified: Secondary | ICD-10-CM

## 2021-02-19 DIAGNOSIS — R7303 Prediabetes: Secondary | ICD-10-CM | POA: Diagnosis not present

## 2021-02-19 DIAGNOSIS — Z981 Arthrodesis status: Secondary | ICD-10-CM

## 2021-02-19 DIAGNOSIS — D7589 Other specified diseases of blood and blood-forming organs: Secondary | ICD-10-CM | POA: Diagnosis not present

## 2021-02-19 DIAGNOSIS — E782 Mixed hyperlipidemia: Secondary | ICD-10-CM

## 2021-02-19 DIAGNOSIS — M5116 Intervertebral disc disorders with radiculopathy, lumbar region: Secondary | ICD-10-CM

## 2021-02-19 LAB — POCT GLYCOSYLATED HEMOGLOBIN (HGB A1C)
HbA1c POC (<> result, manual entry): 5.2 % (ref 4.0–5.6)
HbA1c, POC (controlled diabetic range): 5.2 % (ref 0.0–7.0)
HbA1c, POC (prediabetic range): 5.2 % — AB (ref 5.7–6.4)
Hemoglobin A1C: 5.2 % (ref 4.0–5.6)

## 2021-02-19 MED ORDER — ATORVASTATIN CALCIUM 80 MG PO TABS: 80.0000 mg | ORAL_TABLET | Freq: Every day | ORAL | 3 refills | Status: DC

## 2021-02-19 MED ORDER — PREGABALIN 75 MG PO CAPS: 75.0000 mg | ORAL_CAPSULE | Freq: Two times a day (BID) | ORAL | 5 refills | Status: DC

## 2021-02-19 MED ORDER — DICLOFENAC SODIUM ER 100 MG PO TB24
100.0000 mg | ORAL_TABLET | Freq: Every day | ORAL | 1 refills | Status: DC
Start: 2021-02-19 — End: 2021-05-28

## 2021-02-19 MED ORDER — EZETIMIBE 10 MG PO TABS: 10.0000 mg | ORAL_TABLET | Freq: Every day | ORAL | 3 refills | Status: DC

## 2021-02-19 NOTE — Progress Notes (Signed)
This visit occurred during the SARS-CoV-2 public health emergency.  Safety protocols were in place, including screening questions prior to the visit, additional usage of staff PPE, and extensive cleaning of exam room while observing appropriate contact time as indicated for disinfecting solutions.    Patient ID: Halei Hanover, female  DOB: Aug 21, 1961, 59 y.o.   MRN: [id number] Patient Care Team    Relationship Specialty Notifications Start End  Ma Hillock, DO PCP - General Family Medicine  08/07/15   Kennon Holter, NP Nurse Practitioner Nurse Practitioner  02/27/16    Comment: Gynecology  Gatha Mayer, MD Consulting Physician Gastroenterology  04/30/17     Chief Complaint  Patient presents with  . Follow-up    [facility];   . Hyperlipidemia    Subjective:  Arie Gable is a 60 y.o.  Female  present for [facility] Prediabetes Patient reports she has been changing her diet.  She has lost weight.  She is attempting to exercise but has difficulty with her lower back discomfort.  Mixed hyperlipidemia/Overweight (BMI 25.0-29.9) Patient reports compliance with atorvastatin 80 mg daily and Zetia 10 mg daily.  Macrocytosis without anemia Has been stable without decrease in other cell lines.  She had follow-up with hematology, which felt that she could continue to follow-up every 6 months with PCP with CBC  Vitamin D deficiency She is supplementing.  History of lumbar fusion/Herniation of nucleus pulposus of lumbar intervertebral disc with sciatica Patient reports she has seen Dr. Rolena Infante for her lower back and receiving injections.  She does have continued discomfort.  She states the Voltaren is very helpful but she feels it wears off in the afternoon.  She was taking gabapentin 300 mg nightly, prescribed by Ortho.  She states she cannot tolerate this during the day.  She reports pain that radiates down her leg.  Depression screen [facility] 2/9 02/19/2021 06/27/2020 05/09/2019 06/03/2018 11/03/2017   Decreased Interest 0 0 0 0 0  Down, Depressed, Hopeless 0 0 0 0 0  PHQ - 2 Score 0 0 0 0 0   GAD 7 : Generalized Anxiety Score 05/09/2019 06/03/2018  Nervous, Anxious, on Edge 0 0  Control/stop worrying 0 0  Worry too much - different things 0 0  Trouble relaxing 0 0  Restless 0 0  Easily annoyed or irritable 0 0  Afraid - awful might happen 0 0  Total GAD 7 Score 0 0  Anxiety Difficulty Not difficult at all -    Immunization History  Administered Date(s) Administered  . Influenza,inj,Quad PF,6+ Mos 06/13/2014, 11/12/2016, 06/03/2018, 06/29/2019, 06/27/2020  . PFIZER(Purple Top)SARS-COV-2 Vaccination 12/16/2019, 01/06/2020, 09/07/2020  . Tdap 02/27/2016  . Zoster Recombinat (Shingrix) 06/03/2018, 08/20/2018     Past Medical History:  Diagnosis Date  . Allergy   . BPPV (benign paroxysmal positional vertigo)   . Golfer's elbow, left 05/27/2020  . Hyperlipidemia   . Lipoma of back 06/14/2014   throracic back, R. US shows lipoma. No intervention recommended.   . Rotator cuff tendinitis, right 07/22/2018  . Squamous cell skin cancer    SCC   Allergies  Allergen Reactions  . Hydrocodone-Acetaminophen Hives   Past Surgical History:  Procedure Laterality Date  . APPENDECTOMY    . BACK SURGERY    . BREAST SURGERY     biopsy  . CHOLECYSTECTOMY    . COLONOSCOPY    . LIPOMA EXCISION     under breast on R side   Family History  Adopted: Yes  Problem Relation Age of Onset  . Colon cancer Neg Hx    Social History   Social History Narrative   Ms. Nygard lives with her husband and daughter. She is originally from [place]-moved to [place]. She relocated to [facility] about [place] from [place].    Allergies as of 02/19/2021      Reactions   Hydrocodone-acetaminophen Hives      Medication List       Accurate as of Feb 19, 2021  4:13 PM. If you have any questions, ask your nurse or doctor.        STOP taking these medications   diazepam 5 MG tablet Commonly known as:  VALIUM Stopped by: Howard Pouch, DO   gabapentin 300 MG capsule Commonly known as: NEURONTIN Stopped by: Howard Pouch, DO   LORazepam 1 MG tablet Commonly known as: ATIVAN Stopped by: Howard Pouch, DO   methylPREDNISolone 4 MG Tbpk tablet Commonly known as: MEDROL DOSEPAK Stopped by: Howard Pouch, DO     TAKE these medications   atorvastatin 80 MG tablet Commonly known as: LIPITOR Take 1 tablet (80 mg total) by mouth daily.   b complex vitamins capsule Take 1 capsule by mouth daily.   Diclofenac Sodium CR 100 MG 24 hr tablet Take 1 tablet (100 mg total) by mouth daily.   ezetimibe 10 MG tablet Commonly known as: ZETIA Take 1 tablet (10 mg total) by mouth daily.   fexofenadine 180 MG tablet Commonly known as: ALLEGRA Take 180 mg by mouth daily.   Magnesium 250 MG Tabs Take 1 tablet by mouth.   pregabalin 75 MG capsule Commonly known as: Lyrica Take 1 capsule (75 mg total) by mouth 2 (two) times daily. Started by: Howard Pouch, DO   Vitamin D (Cholecalciferol) 25 MCG (1000 UT) Tabs Take 2,000 Units by mouth.       All past medical history, surgical history, allergies, family history, immunizations andmedications were updated in the EMR today and reviewed under the history and medication portions of their EMR.     Recent Results (from the past 2160 hour(s))  POCT HgB A1C     Status: Abnormal   Collection Time: 02/19/21  3:05 PM  Result Value Ref Range   Hemoglobin A1C 5.2 4.0 - 5.6 %   HbA1c POC (<> result, manual entry) 5.2 4.0 - 5.6 %   HbA1c, POC (prediabetic range) 5.2 (A) 5.7 - 6.4 %   HbA1c, POC (controlled diabetic range) 5.2 0.0 - 7.0 %     ROS: 14 pt review of systems performed and negative (unless mentioned in an HPI)  Objective: BP 127/80   Pulse 80   Temp 98.1 F (36.7 C) (Oral)   Ht _0  (1.626 m)   Wt 155 lb (70.3 kg)   SpO2 96%   BMI 26.61 kg/m  Gen: Afebrile. No acute distress.  Nontoxic, very pleasant female. HENT: AT. [place].   Eyes:Pupils Equal Round Reactive to light, Extraocular movements intact,  Conjunctiva without redness, discharge or icterus. Neck/lymp/endocrine: Supple, no lymphadenopathy, no thyromegaly CV: RRR no murmur, no edema Chest: CTAB, no wheeze or crackles Neuro:  Normal gait. PERLA. EOMi. Alert. Oriented x3 Psych: Normal affect, dress and demeanor. Normal speech. Normal thought content and judgment.    No exam data present  Assessment/plan: Early Ord is a 60 y.o. female present for Mixed hyperlipidemia/overweight Stable.   Continue Zetia and Lipitor 80 mg daily  - follow yearly-fasting labs due next visit  Macrocytosis without anemia CBC collected today Has seen heme and only needs to follow again if other cell lines are abnormal.  Cbc q 6 mos follow up recommend by provider appt. If macrocytosis is present.   Vitamin D deficiency - supplementing 2000u qd-labs due next visit  Prediabetes/overweight Patient has been doing very well with her exercise and diet.  She has lost weight. A1c is 5.2 today.  Congratulated her this is a great change from last A1c of 6.0.  History of lumbar fusion/Herniation of nucleus pulposus of lumbar intervertebral disc with sciatica Continue follow-ups with Dr. Rolena Infante Continue Voltaren 100 mg daily Discontinue gabapentin nightly Start Lyrica 75 mg p.o. x1 week and then increase to twice daily   -Briefly discussed Cymbalta and Celebrex as well today   Return in about 4 months (around 06/28/2021) for CPE (30 min), [facility] (30 min).   Orders Placed This Encounter  Procedures  . CBC w/Diff  . Comp Met (CMET)  . POCT HgB A1C   Meds ordered this encounter  Medications  . atorvastatin (LIPITOR) 80 MG tablet    Sig: Take 1 tablet (80 mg total) by mouth daily.    Dispense:  90 tablet    Refill:  3  . ezetimibe (ZETIA) 10 MG tablet    Sig: Take 1 tablet (10 mg total) by mouth daily.    Dispense:  90 tablet    Refill:  3  . Diclofenac Sodium CR 100  MG 24 hr tablet    Sig: Take 1 tablet (100 mg total) by mouth daily.    Dispense:  90 tablet    Refill:  1  . pregabalin (LYRICA) 75 MG capsule    Sig: Take 1 capsule (75 mg total) by mouth 2 (two) times daily.    Dispense:  60 capsule    Refill:  5   Referral Orders  No referral(s) requested today     Electronically signed by: Howard Pouch, [place]

## 2021-02-19 NOTE — Patient Instructions (Signed)
Great job a1c 5.2.   Next appt around sept 23 rd for physical.    Great to see you today.  I have refilled the medication(s) we provide.   If labs were collected, we will inform you of lab results once received either by echart message or telephone call.   - echart message- for normal results that have been seen by the patient already.   - telephone call: abnormal results or if patient has not viewed results in their echart.

## 2021-02-20 ENCOUNTER — Telehealth: Payer: Self-pay

## 2021-02-20 LAB — CBC WITH DIFFERENTIAL/PLATELET
Basophils Absolute: 0.1 10*3/uL (ref 0.0–0.1)
Basophils Relative: 0.7 % (ref 0.0–3.0)
Eosinophils Absolute: 0.3 10*3/uL (ref 0.0–0.7)
Eosinophils Relative: 3.2 % (ref 0.0–5.0)
HCT: 38.8 % (ref 36.0–46.0)
Hemoglobin: 13.1 g/dL (ref 12.0–15.0)
Lymphocytes Relative: 28 % (ref 12.0–46.0)
Lymphs Abs: 2.5 10*3/uL (ref 0.7–4.0)
MCHC: 33.6 g/dL (ref 30.0–36.0)
MCV: 105.1 fl — ABNORMAL HIGH (ref 78.0–100.0)
Monocytes Absolute: 0.6 10*3/uL (ref 0.1–1.0)
Monocytes Relative: 6.7 % (ref 3.0–12.0)
Neutro Abs: 5.6 10*3/uL (ref 1.4–7.7)
Neutrophils Relative %: 61.4 % (ref 43.0–77.0)
Platelets: 325 10*3/uL (ref 150.0–400.0)
RBC: 3.7 Mil/uL — ABNORMAL LOW (ref 3.87–5.11)
RDW: 13.1 % (ref 11.5–15.5)
WBC: 9.1 10*3/uL (ref 4.0–10.5)

## 2021-02-20 LAB — COMPREHENSIVE METABOLIC PANEL
ALT: 75 U/L — ABNORMAL HIGH (ref 0–35)
AST: 41 U/L — ABNORMAL HIGH (ref 0–37)
Albumin: 4.6 g/dL (ref 3.5–5.2)
Alkaline Phosphatase: 86 U/L (ref 39–117)
BUN: 15 mg/dL (ref 6–23)
CO2: 26 mEq/L (ref 19–32)
Calcium: 9.9 mg/dL (ref 8.4–10.5)
Chloride: 101 mEq/L (ref 96–112)
Creatinine, Ser: 0.72 mg/dL (ref 0.40–1.20)
GFR: 91.7 mL/min (ref >60.00)
Glucose, Bld: 89 mg/dL (ref 70–99)
Potassium: 4.3 mEq/L (ref 3.5–5.1)
Sodium: 137 mEq/L (ref 135–145)
Total Bilirubin: 0.4 mg/dL (ref 0.2–1.2)
Total Protein: 7.2 g/dL (ref 6.0–8.3)

## 2021-02-20 NOTE — Telephone Encounter (Signed)
Would not recommend the chronic use of Advil, ibuprofen, Motrin, Aleve, naproxen these are all in the NSAID group and is too similar to her diclofenac. Tylenol is okay.

## 2021-02-20 NOTE — Telephone Encounter (Signed)
Patient had appt here yesterday, she forgot to ask 1 question.  Can she change Tylenol to Ibprofen in the afternoon?  Please contact patient [phone]

## 2021-02-20 NOTE — Telephone Encounter (Signed)
LVM for pt to CB regarding medication questions 

## 2021-02-21 ENCOUNTER — Telehealth: Payer: Self-pay | Admitting: Family Medicine

## 2021-02-21 DIAGNOSIS — D7589 Other specified diseases of blood and blood-forming organs: Secondary | ICD-10-CM

## 2021-02-21 DIAGNOSIS — R7989 Other specified abnormal findings of blood chemistry: Secondary | ICD-10-CM

## 2021-02-21 NOTE — Telephone Encounter (Signed)
noted 

## 2021-02-21 NOTE — Telephone Encounter (Signed)
Spoke with pt regarding labs and instructions.   

## 2021-02-21 NOTE — Telephone Encounter (Signed)
Please inform patient: Her liver enzymes are elevated, more than prior reading but just mildly with an ALT of 75 and an AST of 41. Electrolytes are normal. Blood cell counts and size are stable.  Blood cells are still larger than normal.  We previously discussed Tylenol use was okay, and it still is on occasions.  I would not recommend Tylenol use daily with her liver enzymes elevating.  Diclofenac can also cause mild elevation in liver enzymes. Again the liver enzymes are high enough to be overly concerned at this time.  I would advise her just to be cautious with overusing Tylenol with diclofenac or any alcohol.  Since her blood cell counts continue to remain large, I would like to recheck her B12 and her iron.  I have placed future labs for her that she can have collected by lab appointment only at her convenience over the next week.  We will call her with those results.

## 2021-02-21 NOTE — Telephone Encounter (Signed)
Patient returning call, Adolm Joseph not available, told me to relay message from Dr. Raoul Pitch which is below: Would not recommend the chronic use of Advil, ibuprofen, Motrin, Aleve, naproxen these are all in the NSAID group and is too similar to her diclofenac. Tylenol is okay.  Patient understood.

## 2021-03-18 ENCOUNTER — Telehealth: Payer: Self-pay

## 2021-03-18 NOTE — Telephone Encounter (Signed)
Spoke with pt regarding meds and instructions.

## 2021-03-18 NOTE — Telephone Encounter (Signed)
She can decrease Lyrica to once a day and see how that works. Did recommend doing that before trying a different medication.

## 2021-03-18 NOTE — Telephone Encounter (Signed)
Please advise 

## 2021-03-18 NOTE — Telephone Encounter (Signed)
Patient states that Dr. Raoul Pitch prescribed Lyrica.  Patient states that she is in a "fog" when taking the medication. She would like decrease the amount she is taking OR suggest another medication  Patient can be reached at [phone]

## 2021-05-14 ENCOUNTER — Encounter (HOSPITAL_COMMUNITY)
Admission: EM | Disposition: A | Discharge status: Home / Self Care | Payer: Self-pay | Source: Home / Self Care | Attending: Emergency Medicine

## 2021-05-14 ENCOUNTER — Observation Stay (HOSPITAL_COMMUNITY): Payer: Commercial Managed Care - PPO | Admitting: Certified Registered Nurse Anesthetist

## 2021-05-14 ENCOUNTER — Emergency Department (HOSPITAL_COMMUNITY): Payer: Commercial Managed Care - PPO

## 2021-05-14 ENCOUNTER — Encounter (HOSPITAL_COMMUNITY): Payer: Self-pay | Admitting: Pharmacy Technician

## 2021-05-14 ENCOUNTER — Other Ambulatory Visit: Payer: Self-pay

## 2021-05-14 ENCOUNTER — Observation Stay (HOSPITAL_COMMUNITY)
Admission: EM | Admit: 2021-05-14 | Discharge: 2021-05-15 | Disposition: A | Discharge status: Home / Self Care | Payer: Commercial Managed Care - PPO | Attending: Emergency Medicine | Admitting: Emergency Medicine

## 2021-05-14 DIAGNOSIS — Z85828 Personal history of other malignant neoplasm of skin: Secondary | ICD-10-CM | POA: Diagnosis not present

## 2021-05-14 DIAGNOSIS — Z87891 Personal history of nicotine dependence: Secondary | ICD-10-CM | POA: Diagnosis not present

## 2021-05-14 DIAGNOSIS — Z20822 Contact with and (suspected) exposure to covid-19: Secondary | ICD-10-CM | POA: Insufficient documentation

## 2021-05-14 DIAGNOSIS — Z79899 Other long term (current) drug therapy: Secondary | ICD-10-CM | POA: Diagnosis not present

## 2021-05-14 DIAGNOSIS — K3580 Unspecified acute appendicitis: Secondary | ICD-10-CM

## 2021-05-14 DIAGNOSIS — R7303 Prediabetes: Secondary | ICD-10-CM | POA: Insufficient documentation

## 2021-05-14 DIAGNOSIS — K353 Acute appendicitis with localized peritonitis, without perforation or gangrene: Secondary | ICD-10-CM | POA: Diagnosis not present

## 2021-05-14 DIAGNOSIS — R1031 Right lower quadrant pain: Secondary | ICD-10-CM | POA: Diagnosis present

## 2021-05-14 HISTORY — PX: LAPAROSCOPIC APPENDECTOMY: SHX408

## 2021-05-14 HISTORY — DX: Unspecified acute appendicitis: K35.80

## 2021-05-14 LAB — COMPREHENSIVE METABOLIC PANEL
ALT: 58 U/L — ABNORMAL HIGH (ref 0–44)
AST: 30 U/L (ref 15–41)
Albumin: 4.1 g/dL (ref 3.5–5.0)
Alkaline Phosphatase: 100 U/L (ref 38–126)
Anion gap: 11 (ref 5–15)
BUN: 11 mg/dL (ref 6–20)
CO2: 23 mmol/L (ref 22–32)
Calcium: 9.8 mg/dL (ref 8.9–10.3)
Chloride: 99 mmol/L (ref 98–111)
Creatinine, Ser: 0.67 mg/dL (ref 0.44–1.00)
GFR, Estimated: >60 mL/min (ref >60)
Glucose, Bld: 150 mg/dL — ABNORMAL HIGH (ref 70–99)
Potassium: 3.4 mmol/L — ABNORMAL LOW (ref 3.5–5.1)
Sodium: 133 mmol/L — ABNORMAL LOW (ref 135–145)
Total Bilirubin: 1.3 mg/dL — ABNORMAL HIGH (ref 0.3–1.2)
Total Protein: 7.8 g/dL (ref 6.5–8.1)

## 2021-05-14 LAB — RESP PANEL BY RT-PCR (FLU A&B, COVID) ARPGX2
Influenza A by PCR: NEGATIVE
Influenza B by PCR: NEGATIVE
SARS Coronavirus 2 by RT PCR: NEGATIVE

## 2021-05-14 LAB — CBC
HCT: 41.3 % (ref 36.0–46.0)
Hemoglobin: 14 g/dL (ref 12.0–15.0)
MCH: 34.9 pg — ABNORMAL HIGH (ref 26.0–34.0)
MCHC: 33.9 g/dL (ref 30.0–36.0)
MCV: 103 fL — ABNORMAL HIGH (ref 80.0–100.0)
Platelets: 336 10*3/uL (ref 150–400)
RBC: 4.01 MIL/uL (ref 3.87–5.11)
RDW: 12.1 % (ref 11.5–15.5)
WBC: 18.8 10*3/uL — ABNORMAL HIGH (ref 4.0–10.5)
nRBC: 0 % (ref 0.0–0.2)

## 2021-05-14 LAB — URINALYSIS, ROUTINE W REFLEX MICROSCOPIC
Bilirubin Urine: NEGATIVE
Glucose, UA: NEGATIVE mg/dL
Hgb urine dipstick: NEGATIVE
Ketones, ur: 80 mg/dL — AB
Leukocytes,Ua: NEGATIVE
Nitrite: NEGATIVE
Protein, ur: 100 mg/dL — AB
Specific Gravity, Urine: 1.021 (ref 1.005–1.030)
pH: 5 (ref 5.0–8.0)

## 2021-05-14 LAB — LIPASE, BLOOD: Lipase: 28 U/L (ref 11–51)

## 2021-05-14 LAB — LACTIC ACID, PLASMA: Lactic Acid, Venous: 1.2 mmol/L (ref 0.5–1.9)

## 2021-05-14 SURGERY — APPENDECTOMY, LAPAROSCOPIC
Anesthesia: General | Site: Abdomen

## 2021-05-14 MED ORDER — OXYCODONE HCL 5 MG PO TABS
5.0000 mg | ORAL_TABLET | ORAL | Status: DC | PRN
  Administered 2021-05-15: 5 mg via ORAL
  Filled 2021-05-14: qty 1

## 2021-05-14 MED ORDER — SIMETHICONE 80 MG PO CHEW: 40.0000 mg | CHEWABLE_TABLET | Freq: Four times a day (QID) | ORAL | Status: DC | PRN

## 2021-05-14 MED ORDER — APREPITANT 40 MG PO CAPS
ORAL_CAPSULE | ORAL | Status: AC
  Administered 2021-05-14: 40 mg
  Filled 2021-05-14: qty 1

## 2021-05-14 MED ORDER — CHLORHEXIDINE GLUCONATE 0.12 % MT SOLN
15.0000 mL | OROMUCOSAL | Status: AC
  Filled 2021-05-14: qty 15

## 2021-05-14 MED ORDER — SODIUM CHLORIDE 0.9 % IV SOLN
2.0000 g | Freq: Once | INTRAVENOUS | Status: AC
  Administered 2021-05-14: 2 g via INTRAVENOUS
  Filled 2021-05-14: qty 20

## 2021-05-14 MED ORDER — FENTANYL CITRATE (PF) 100 MCG/2ML IJ SOLN
50.0000 ug | Freq: Once | INTRAMUSCULAR | Status: AC
  Administered 2021-05-14: 50 ug via INTRAVENOUS
  Filled 2021-05-14: qty 2

## 2021-05-14 MED ORDER — MORPHINE SULFATE (PF) 2 MG/ML IV SOLN
1.0000 mg | INTRAVENOUS | Status: DC | PRN
  Administered 2021-05-14: 2 mg via INTRAVENOUS
  Filled 2021-05-14: qty 1

## 2021-05-14 MED ORDER — MIDAZOLAM HCL 2 MG/2ML IJ SOLN
INTRAMUSCULAR | Status: DC | PRN
  Administered 2021-05-14: 2 mg via INTRAVENOUS

## 2021-05-14 MED ORDER — ACETAMINOPHEN 10 MG/ML IV SOLN
INTRAVENOUS | Status: AC
  Filled 2021-05-14: qty 100

## 2021-05-14 MED ORDER — ONDANSETRON HCL 4 MG/2ML IJ SOLN
4.0000 mg | Freq: Four times a day (QID) | INTRAMUSCULAR | Status: DC | PRN
  Administered 2021-05-14: 4 mg via INTRAVENOUS
  Filled 2021-05-14: qty 2

## 2021-05-14 MED ORDER — BUPIVACAINE-EPINEPHRINE 0.25% -1:200000 IJ SOLN
INTRAMUSCULAR | Status: DC | PRN
  Administered 2021-05-14: 16 mL

## 2021-05-14 MED ORDER — MIDAZOLAM HCL 2 MG/2ML IJ SOLN
INTRAMUSCULAR | Status: AC
  Filled 2021-05-14: qty 2

## 2021-05-14 MED ORDER — HYDROMORPHONE HCL 1 MG/ML IJ SOLN: 0.5000 mg | INTRAMUSCULAR | Status: DC | PRN

## 2021-05-14 MED ORDER — SODIUM CHLORIDE 0.9 % IV BOLUS
1000.0000 mL | Freq: Once | INTRAVENOUS | Status: AC
  Administered 2021-05-14: 1000 mL via INTRAVENOUS

## 2021-05-14 MED ORDER — METOPROLOL TARTRATE 5 MG/5ML IV SOLN: 5.0000 mg | Freq: Four times a day (QID) | INTRAVENOUS | Status: DC | PRN

## 2021-05-14 MED ORDER — LIDOCAINE 2% (20 MG/ML) 5 ML SYRINGE
INTRAMUSCULAR | Status: DC | PRN
  Administered 2021-05-14: 40 mg via INTRAVENOUS

## 2021-05-14 MED ORDER — PROPOFOL 10 MG/ML IV BOLUS
INTRAVENOUS | Status: AC
  Filled 2021-05-14: qty 40

## 2021-05-14 MED ORDER — PROPOFOL 10 MG/ML IV BOLUS
INTRAVENOUS | Status: DC | PRN
  Administered 2021-05-14: 110 mg via INTRAVENOUS

## 2021-05-14 MED ORDER — FENTANYL CITRATE (PF) 100 MCG/2ML IJ SOLN: 50.0000 ug | Freq: Once | INTRAMUSCULAR | Status: DC

## 2021-05-14 MED ORDER — SODIUM CHLORIDE 0.9 % IV SOLN: INTRAVENOUS | Status: DC

## 2021-05-14 MED ORDER — DIPHENHYDRAMINE HCL 25 MG PO CAPS: 25.0000 mg | ORAL_CAPSULE | Freq: Four times a day (QID) | ORAL | Status: DC | PRN

## 2021-05-14 MED ORDER — SCOPOLAMINE 1 MG/3DAYS TD PT72
MEDICATED_PATCH | TRANSDERMAL | Status: DC | PRN
  Administered 2021-05-14: 1 via TRANSDERMAL

## 2021-05-14 MED ORDER — LACTATED RINGERS IV SOLN: INTRAVENOUS | Status: DC

## 2021-05-14 MED ORDER — ACETAMINOPHEN 10 MG/ML IV SOLN
INTRAVENOUS | Status: DC | PRN
  Administered 2021-05-14: 1000 mg via INTRAVENOUS

## 2021-05-14 MED ORDER — CHLORHEXIDINE GLUCONATE 0.12 % MT SOLN
OROMUCOSAL | Status: AC
  Administered 2021-05-14: 15 mL via OROMUCOSAL
  Filled 2021-05-14: qty 15

## 2021-05-14 MED ORDER — APREPITANT 40 MG PO CAPS
ORAL_CAPSULE | ORAL | Status: AC
  Filled 2021-05-14: qty 1

## 2021-05-14 MED ORDER — METRONIDAZOLE 500 MG/100ML IV SOLN
500.0000 mg | Freq: Three times a day (TID) | INTRAVENOUS | Status: DC
  Administered 2021-05-14 – 2021-05-15 (×2): 500 mg via INTRAVENOUS
  Filled 2021-05-14 (×2): qty 100

## 2021-05-14 MED ORDER — SCOPOLAMINE 1 MG/3DAYS TD PT72
MEDICATED_PATCH | TRANSDERMAL | Status: AC
  Filled 2021-05-14: qty 1

## 2021-05-14 MED ORDER — MELATONIN 3 MG PO TABS: 3.0000 mg | ORAL_TABLET | Freq: Every evening | ORAL | Status: DC | PRN

## 2021-05-14 MED ORDER — FENTANYL CITRATE (PF) 250 MCG/5ML IJ SOLN
INTRAMUSCULAR | Status: AC
  Filled 2021-05-14: qty 5

## 2021-05-14 MED ORDER — DOCUSATE SODIUM 100 MG PO CAPS
100.0000 mg | ORAL_CAPSULE | Freq: Two times a day (BID) | ORAL | Status: DC
  Administered 2021-05-14 – 2021-05-15 (×2): 100 mg via ORAL
  Filled 2021-05-14 (×2): qty 1

## 2021-05-14 MED ORDER — SODIUM CHLORIDE 0.9 % IV SOLN
2.0000 g | INTRAVENOUS | Status: DC
  Filled 2021-05-14: qty 20

## 2021-05-14 MED ORDER — METRONIDAZOLE 500 MG/100ML IV SOLN
500.0000 mg | Freq: Once | INTRAVENOUS | Status: AC
  Administered 2021-05-14: 500 mg via INTRAVENOUS
  Filled 2021-05-14: qty 100

## 2021-05-14 MED ORDER — DEXAMETHASONE SODIUM PHOSPHATE 10 MG/ML IJ SOLN
INTRAMUSCULAR | Status: DC | PRN
  Administered 2021-05-14: 10 mg via INTRAVENOUS

## 2021-05-14 MED ORDER — 0.9 % SODIUM CHLORIDE (POUR BTL) OPTIME
TOPICAL | Status: DC | PRN
  Administered 2021-05-14: 1000 mL

## 2021-05-14 MED ORDER — SODIUM CHLORIDE 0.9 % IR SOLN
Status: DC | PRN
  Administered 2021-05-14: 1000 mL

## 2021-05-14 MED ORDER — ROCURONIUM BROMIDE 10 MG/ML (PF) SYRINGE
PREFILLED_SYRINGE | INTRAVENOUS | Status: DC | PRN
  Administered 2021-05-14: 60 mg via INTRAVENOUS

## 2021-05-14 MED ORDER — ACETAMINOPHEN 500 MG PO TABS
1000.0000 mg | ORAL_TABLET | Freq: Four times a day (QID) | ORAL | Status: DC
  Administered 2021-05-15 (×2): 1000 mg via ORAL
  Filled 2021-05-14 (×3): qty 2

## 2021-05-14 MED ORDER — ONDANSETRON 4 MG PO TBDP: 4.0000 mg | ORAL_TABLET | Freq: Four times a day (QID) | ORAL | Status: DC | PRN

## 2021-05-14 MED ORDER — ENOXAPARIN SODIUM 40 MG/0.4ML IJ SOSY
40.0000 mg | PREFILLED_SYRINGE | INTRAMUSCULAR | Status: DC
  Filled 2021-05-14: qty 0.4

## 2021-05-14 MED ORDER — DICLOFENAC SODIUM 25 MG PO TBEC
100.0000 mg | DELAYED_RELEASE_TABLET | Freq: Every day | ORAL | Status: DC
  Filled 2021-05-14 (×2): qty 2
  Filled 2021-05-14: qty 4

## 2021-05-14 MED ORDER — PREGABALIN 75 MG PO CAPS
75.0000 mg | ORAL_CAPSULE | Freq: Two times a day (BID) | ORAL | Status: DC
  Administered 2021-05-14 – 2021-05-15 (×2): 75 mg via ORAL
  Filled 2021-05-14 (×2): qty 1
  Filled 2021-05-14: qty 3

## 2021-05-14 MED ORDER — FENTANYL CITRATE (PF) 100 MCG/2ML IJ SOLN: 25.0000 ug | INTRAMUSCULAR | Status: DC | PRN

## 2021-05-14 MED ORDER — IOHEXOL 350 MG/ML SOLN
100.0000 mL | Freq: Once | INTRAVENOUS | Status: AC | PRN
  Administered 2021-05-14: 100 mL via INTRAVENOUS

## 2021-05-14 MED ORDER — FENTANYL CITRATE (PF) 250 MCG/5ML IJ SOLN
INTRAMUSCULAR | Status: DC | PRN
  Administered 2021-05-14: 50 ug via INTRAVENOUS
  Administered 2021-05-14: 100 ug via INTRAVENOUS
  Administered 2021-05-14: 50 ug via INTRAVENOUS

## 2021-05-14 MED ORDER — SUGAMMADEX SODIUM 200 MG/2ML IV SOLN
INTRAVENOUS | Status: DC | PRN
Start: 2021-05-14 — End: 2021-05-14
  Administered 2021-05-14: 200 mg via INTRAVENOUS

## 2021-05-14 MED ORDER — BUPIVACAINE-EPINEPHRINE (PF) 0.25% -1:200000 IJ SOLN
INTRAMUSCULAR | Status: AC
  Filled 2021-05-14: qty 30

## 2021-05-14 MED ORDER — ONDANSETRON HCL 4 MG/2ML IJ SOLN
INTRAMUSCULAR | Status: DC | PRN
  Administered 2021-05-14: 4 mg via INTRAVENOUS

## 2021-05-14 MED ORDER — DIPHENHYDRAMINE HCL 50 MG/ML IJ SOLN: 25.0000 mg | Freq: Four times a day (QID) | INTRAMUSCULAR | Status: DC | PRN

## 2021-05-14 SURGICAL SUPPLY — 40 items
APPLIER CLIP 5 13 M/L LIGAMAX5 (MISCELLANEOUS) ×2
BAG COUNTER SPONGE SURGICOUNT (BAG) ×2 IMPLANT
BLADE CLIPPER SURG (BLADE) IMPLANT
CANISTER SUCT 3000ML PPV (MISCELLANEOUS) ×2 IMPLANT
CHLORAPREP W/TINT 26 (MISCELLANEOUS) ×2 IMPLANT
CLIP APPLIE 5 13 M/L LIGAMAX5 (MISCELLANEOUS) ×1 IMPLANT
COVER SURGICAL LIGHT HANDLE (MISCELLANEOUS) ×2 IMPLANT
CUTTER FLEX LINEAR 45M (STAPLE) ×2 IMPLANT
DERMABOND ADVANCED (GAUZE/BANDAGES/DRESSINGS) ×1
DERMABOND ADVANCED .7 DNX12 (GAUZE/BANDAGES/DRESSINGS) ×1 IMPLANT
ELECT REM PT RETURN 9FT ADLT (ELECTROSURGICAL) ×2
ELECTRODE REM PT RTRN 9FT ADLT (ELECTROSURGICAL) ×1 IMPLANT
GLOVE SURG ENC MOIS LTX SZ6 (GLOVE) ×2 IMPLANT
GLOVE SURG UNDER LTX SZ6.5 (GLOVE) ×2 IMPLANT
GOWN STRL REUS W/ TWL LRG LVL3 (GOWN DISPOSABLE) ×3 IMPLANT
GOWN STRL REUS W/TWL LRG LVL3 (GOWN DISPOSABLE) ×3
GRASPER SUT TROCAR 14GX15 (MISCELLANEOUS) ×2 IMPLANT
KIT BASIN OR (CUSTOM PROCEDURE TRAY) ×2 IMPLANT
KIT TURNOVER KIT B (KITS) ×2 IMPLANT
NEEDLE INSUFFLATION 14GA 120MM (NEEDLE) ×2 IMPLANT
NS IRRIG 1000ML POUR BTL (IV SOLUTION) ×2 IMPLANT
PAD ARMBOARD 7.5X6 YLW CONV (MISCELLANEOUS) ×4 IMPLANT
POUCH SPECIMEN RETRIEVAL 10MM (ENDOMECHANICALS) ×2 IMPLANT
RELOAD 45 VASCULAR/THIN (ENDOMECHANICALS) ×2 IMPLANT
RELOAD STAPLE TA45 3.5 REG BLU (ENDOMECHANICALS) IMPLANT
SCISSORS LAP 5X35 DISP (ENDOMECHANICALS) IMPLANT
SET IRRIG TUBING LAPAROSCOPIC (IRRIGATION / IRRIGATOR) ×2 IMPLANT
SET TUBE SMOKE EVAC HIGH FLOW (TUBING) ×2 IMPLANT
SHEARS HARMONIC ACE PLUS 36CM (ENDOMECHANICALS) ×2 IMPLANT
SLEEVE ENDOPATH XCEL 5M (ENDOMECHANICALS) ×4 IMPLANT
SPECIMEN JAR SMALL (MISCELLANEOUS) ×2 IMPLANT
SUT MNCRL AB 4-0 PS2 18 (SUTURE) ×4 IMPLANT
TOWEL GREEN STERILE FF (TOWEL DISPOSABLE) ×2 IMPLANT
TRAY FOLEY W/BAG SLVR 14FR (SET/KITS/TRAYS/PACK) ×2 IMPLANT
TRAY FOLEY W/BAG SLVR 16FR (SET/KITS/TRAYS/PACK)
TRAY FOLEY W/BAG SLVR 16FR ST (SET/KITS/TRAYS/PACK) IMPLANT
TRAY LAPAROSCOPIC MC (CUSTOM PROCEDURE TRAY) ×2 IMPLANT
TROCAR XCEL 12X100 BLDLESS (ENDOMECHANICALS) ×2 IMPLANT
TROCAR XCEL NON-BLD 5MMX100MML (ENDOMECHANICALS) ×2 IMPLANT
WATER STERILE IRR 1000ML POUR (IV SOLUTION) ×2 IMPLANT

## 2021-05-14 NOTE — ED Triage Notes (Signed)
Pt here with reports of RLQ abdominal pain with NV and fever onset yesterday.

## 2021-05-14 NOTE — Anesthesia Preprocedure Evaluation (Addendum)
Anesthesia Evaluation  Patient identified by MRN, date of birth, ID band Patient awake    Reviewed: Allergy & Precautions, NPO status , Patient's Chart, lab work & pertinent test results  History of Anesthesia Complications (+) PONV  Airway Mallampati: II  TM Distance: >3 FB Neck ROM: Full    Dental no notable dental hx. (+) Teeth Intact, Dental Advisory Given   Pulmonary neg pulmonary ROS, former smoker,    Pulmonary exam normal breath sounds clear to auscultation       Cardiovascular negative cardio ROS Normal cardiovascular exam Rhythm:Regular Rate:Normal  HLD   Neuro/Psych negative neurological ROS  negative psych ROS   GI/Hepatic negative GI ROS, Neg liver ROS,   Endo/Other  negative endocrine ROS  Renal/GU negative Renal ROS  negative genitourinary   Musculoskeletal negative musculoskeletal ROS (+)   Abdominal   Peds  Hematology negative hematology ROS (+)   Anesthesia Other Findings   Reproductive/Obstetrics                            Anesthesia Physical Anesthesia Plan  ASA: 2  Anesthesia Plan: General   Post-op Pain Management:    Induction: Intravenous  PONV Risk Score and Plan: 3 and Midazolam, Dexamethasone, Ondansetron, Aprepitant and Scopolamine patch - Pre-op  Airway Management Planned: Oral ETT  Additional Equipment:   Intra-op Plan:   Post-operative Plan: Extubation in OR  Informed Consent: I have reviewed the patients History and Physical, chart, labs and discussed the procedure including the risks, benefits and alternatives for the proposed anesthesia with the patient or authorized representative who has indicated his/her understanding and acceptance.     Dental advisory given  Plan Discussed with: CRNA  Anesthesia Plan Comments:        Anesthesia Quick Evaluation

## 2021-05-14 NOTE — Anesthesia Postprocedure Evaluation (Signed)
Anesthesia Post Note  Patient: Mikayla Tucker  Procedure(s) Performed: APPENDECTOMY LAPAROSCOPIC (Abdomen)     Patient location during evaluation: PACU Anesthesia Type: General Level of consciousness: awake and alert Pain management: pain level controlled Vital Signs Assessment: post-procedure vital signs reviewed and stable Respiratory status: spontaneous breathing, nonlabored ventilation, respiratory function stable and patient connected to nasal cannula oxygen Cardiovascular status: blood pressure returned to baseline and stable Postop Assessment: no apparent nausea or vomiting Anesthetic complications: no   No notable events documented.  Last Vitals:  Vitals:   05/14/21 1935 05/14/21 1958  BP:  (!) 158/94  Pulse:  100  Resp:  16  Temp:  36.9 C  SpO2: 93% 98%    Last Pain:  Vitals:   05/14/21 1958  TempSrc: Oral  PainSc:                  [name]

## 2021-05-14 NOTE — H&P (Signed)
[facility] Admission Note  Mikayla Tucker [birth year]  [id number].    Chief Complaint: abdominal pain   HPI: Pt is a 60 y/o female with a history of chronic back pain and high cholesterol who began having some RLQ abdominal pain yesterday morning.  She had associated nausea and vomiting as well as some diarrhea.  She had fevers as high as 100.9.  she had no appetite and didn't really eat or drink yesterday.  Her pain persisted and nothing seemed to help it.  She finally presented to the [facility] early today for evaluation.  She has been found to have a WBC of 18K and a CT scan concerning for acute uncomplicated appendicitis.  We have been asked to see her for further evaluation and treatment.  ROS: Please see HPI, otherwise all other systems have been reviewed and are negative.  Family History  Adopted: Yes  Problem Relation Age of Onset   Colon cancer Neg Hx     Past Medical History:  Diagnosis Date   Allergy    BPPV (benign paroxysmal positional vertigo)    Golfer's elbow, left 05/27/2020   Hyperlipidemia    Lipoma of back 06/14/2014   throracic back, R. US shows lipoma. No intervention recommended.    Rotator cuff tendinitis, right 07/22/2018   Squamous cell skin cancer    SCC    Past Surgical History:  Procedure Laterality Date   APPENDECTOMY     BACK SURGERY     BREAST SURGERY     biopsy   CHOLECYSTECTOMY     COLONOSCOPY     LIPOMA EXCISION     under breast on R side    Social History:  reports that she has quit smoking. She has a 10.00 pack-year smoking history. She has never used smokeless tobacco. She reports current alcohol use of about 7.0 standard drinks of alcohol per week. She reports that she does not use drugs.  Allergies:  Allergies  Allergen Reactions   Hydrocodone-Acetaminophen Hives and Other (See Comments)    Pt states that she does not recall having a reaction to this medication. She may have taken this med after getting her appendix removed.     (Not in a hospital admission)   Blood pressure (!) 146/93, pulse (!) 102, temperature 98.4 F (36.9 C), temperature source Oral, resp. rate 17, SpO2 95 %. Physical Exam:  General: pleasant, WD, WN white female who is laying in bed in NAD HEENT: head is normocephalic, atraumatic.  Sclera are noninjected.  PERRL.  Ears and nose without any masses or lesions.  Mouth is pink and moist Heart: regular, rate, and rhythm, but with somewhat frequent PVCs at times.  Normal s1,s2. No obvious murmurs, gallops, or rubs noted.  Palpable radial and pedal pulses bilaterally Lungs: CTAB, no wheezes, rhonchi, or rales noted.  Respiratory effort nonlabored Abd: soft, tender in RLQ at McBurney's point, ND, +BS, no masses, hernias, or organomegaly.  Prior scars noted from c-section, lipoma resection in upper abdomen, as well as lap chole. MS: all 4 extremities are symmetrical with no cyanosis, clubbing, or edema. Skin: warm and dry with no masses, lesions, or rashes Neuro: Cranial nerves 2-12 grossly intact, sensation is normal throughout Psych: A&Ox3 with an appropriate affect.   Results for orders placed or performed during the hospital encounter of 05/14/21 (from the past 48 hour(s))  Lipase, blood     Status: None   Collection Time: 05/14/21  7:45 AM  Result Value Ref Range  Lipase 28 11 - 51 U/L    Comment: Performed at [facility], [place] [address], [place], [place] [address]  Comprehensive metabolic panel     Status: Abnormal   Collection Time: 05/14/21  7:45 AM  Result Value Ref Range   Sodium 133 (L) 135 - 145 mmol/L   Potassium 3.4 (L) 3.5 - 5.1 mmol/L   Chloride 99 98 - 111 mmol/L   CO2 23 22 - 32 mmol/L   Glucose, Bld 150 (H) 70 - 99 mg/dL    Comment: Glucose reference range applies only to samples taken after fasting for at least 8 hours.   BUN 11 6 - 20 mg/dL   Creatinine, Ser 0.67 0.44 - 1.00 mg/dL   Calcium 9.8 8.9 - 10.3 mg/dL   Total Protein 7.8 6.5 - 8.1 g/dL   Albumin  4.1 3.5 - 5.0 g/dL   AST 30 15 - 41 U/L   ALT 58 (H) 0 - 44 U/L   Alkaline Phosphatase 100 38 - 126 U/L   Total Bilirubin 1.3 (H) 0.3 - 1.2 mg/dL   GFR, Estimated >60 >60 mL/min    Comment: (NOTE) Calculated using the CKD-EPI Creatinine Equation (2021)    Anion gap 11 5 - 15    Comment: Performed at [facility] [address] [place], [place] [address]  CBC     Status: Abnormal   Collection Time: 05/14/21  7:45 AM  Result Value Ref Range   WBC 18.8 (H) 4.0 - 10.5 K/uL   RBC 4.01 3.87 - 5.11 MIL/uL   Hemoglobin 14.0 12.0 - 15.0 g/dL   HCT 41.3 36.0 - 46.0 %   MCV 103.0 (H) 80.0 - 100.0 fL   MCH 34.9 (H) 26.0 - 34.0 pg   MCHC 33.9 30.0 - 36.0 g/dL   RDW 12.1 11.5 - 15.5 %   Platelets 336 150 - 400 K/uL   nRBC 0.0 0.0 - 0.2 %    Comment: Performed at [facility], [place] [address], [place], [place] [address]  Urinalysis, Routine w reflex microscopic     Status: Abnormal   Collection Time: 05/14/21  7:45 AM  Result Value Ref Range   Color, Urine YELLOW YELLOW   APPearance HAZY (A) CLEAR   Specific Gravity, Urine 1.021 1.005 - 1.030   pH 5.0 5.0 - 8.0   Glucose, UA NEGATIVE NEGATIVE mg/dL   Hgb urine dipstick NEGATIVE NEGATIVE   Bilirubin Urine NEGATIVE NEGATIVE   Ketones, ur 80 (A) NEGATIVE mg/dL   Protein, ur 100 (A) NEGATIVE mg/dL   Nitrite NEGATIVE NEGATIVE   Leukocytes,Ua NEGATIVE NEGATIVE   RBC / HPF 0-5 0 - 5 RBC/hpf   Bacteria, UA FEW (A) NONE SEEN   Squamous Epithelial / LPF 0-5 0 - 5   Mucus PRESENT     Comment: Performed at [facility], [place] [address], [place], [place] [address]  Lactic acid, plasma     Status: None   Collection Time: 05/14/21 10:09 AM  Result Value Ref Range   Lactic Acid, Venous 1.2 0.5 - 1.9 mmol/L    Comment: Performed at [facility] [address] [place], [place] [address]  Resp Panel by RT-PCR (Flu A&B, Covid) Nasopharyngeal Swab     Status: None   Collection Time: 05/14/21 11:13 AM   Specimen:  Nasopharyngeal Swab; Nasopharyngeal(NP) swabs in vial transport medium  Result Value Ref Range   SARS Coronavirus 2 by RT PCR  NEGATIVE NEGATIVE    Comment: (NOTE) SARS-CoV-2 target nucleic acids are NOT DETECTED.  The SARS-CoV-2 RNA is generally detectable in upper respiratory specimens during the acute phase of infection. The lowest concentration of SARS-CoV-2 viral copies this assay can detect is 138 copies/mL. A negative result does not preclude SARS-Cov-2 infection and should not be used as the sole basis for treatment or other patient management decisions. A negative result may occur with  improper specimen collection/handling, submission of specimen other than nasopharyngeal swab, presence of viral mutation(s) within the areas targeted by this assay, and inadequate number of viral copies(<138 copies/mL). A negative result must be combined with clinical observations, patient history, and epidemiological information. The expected result is Negative.  Fact Sheet for Patients:  EntrepreneurPulse.com.au  Fact Sheet for Healthcare Providers:  IncredibleEmployment.be  This test is no t yet approved or cleared by the [place] FDA and  has been authorized for detection and/or diagnosis of SARS-CoV-2 by FDA under an Emergency Use Authorization (EUA). This EUA will remain  in effect (meaning this test can be used) for the duration of the COVID-19 declaration under Section 564(b)(1) of the Act, 21 U.S.C.section 360bbb-3(b)(1), unless the authorization is terminated  or revoked sooner.       Influenza A by PCR NEGATIVE NEGATIVE   Influenza B by PCR NEGATIVE NEGATIVE    Comment: (NOTE) The Xpert Xpress SARS-CoV-2/FLU/RSV plus assay is intended as an aid in the diagnosis of influenza from Nasopharyngeal swab specimens and should not be used as a sole basis for treatment. Nasal washings and aspirates are unacceptable for Xpert Xpress  SARS-CoV-2/FLU/RSV testing.  Fact Sheet for Patients: EntrepreneurPulse.com.au  Fact Sheet for Healthcare Providers: IncredibleEmployment.be  This test is not yet approved or cleared by the [place] FDA and has been authorized for detection and/or diagnosis of SARS-CoV-2 by FDA under an Emergency Use Authorization (EUA). This EUA will remain in effect (meaning this test can be used) for the duration of the COVID-19 declaration under Section 564(b)(1) of the Act, 21 U.S.C. section 360bbb-3(b)(1), unless the authorization is terminated or revoked.  Performed at [facility], [place] [address] [place], [place] [address]    CT ABDOMEN PELVIS W CONTRAST  Result Date: 05/14/2021 CLINICAL DATA:  Abdominal abscess/infection suspected EXAM: CT ABDOMEN AND PELVIS WITH CONTRAST TECHNIQUE: Multidetector CT imaging of the abdomen and pelvis was performed using the standard protocol following bolus administration of intravenous contrast. CONTRAST:  141m OMNIPAQUE IOHEXOL 350 MG/ML SOLN COMPARISON:  11/25/2018, MRI 08/08/2017 FINDINGS: Lower chest: Mild left basilar atelectasis. Heart size within normal limits. Hepatobiliary: Diffusely decreased attenuation of the hepatic parenchyma. No focal hepatic lesion is identified. Surgically absent gallbladder. No biliary dilatation. Pancreas: Unremarkable. No pancreatic ductal dilatation or surrounding inflammatory changes. Spleen: Normal in size without focal abnormality. Adrenals/Urinary Tract: 2.7 x 2.0 cm right adrenal gland nodule, which has been previously characterized as a benign adrenal adenoma. Normal left adrenal gland. Kidneys enhance symmetrically. No focal renal lesion, stone, or hydronephrosis. Urinary bladder is within normal limits. Stomach/Bowel: Dilated, inflamed appendix with periappendiceal fat stranding and trace free fluid. No organized periappendiceal fluid collection. No extraluminal air. Stomach  and small bowel within normal limits. No colonic wall thickening. Vascular/Lymphatic: Scattered aortoiliac atherosclerotic calcifications without aneurysm. Circumaortic left renal vein, an anatomic variant no abdominopelvic lymphadenopathy. Reproductive: 3.5 cm simple fluid attenuation cyst within the left adnexa. No right adnexal abnormality. Uterus within normal limits. Other: No ascites.  No pneumoperitoneum.  No abdominal wall hernia.  Musculoskeletal: Prior L4-5 PLIF.  No acute osseous abnormality. IMPRESSION: 1. Acute uncomplicated appendicitis. 2. Hepatic steatosis. 3. Slight interval increase in size of a 2.7 cm right adrenal gland nodule, which has been previously characterized as a benign adrenal adenoma. 4. 3.5 cm simple fluid attenuation cyst within the left adnexa. Recommend follow-up US in 6-12 months. Note: This recommendation does not apply to premenarchal patients and to those with increased risk (genetic, family history, elevated tumor markers or other high-risk factors) of ovarian cancer. Reference: JACR 2020 Feb; 17(2):248-254 Aortic Atherosclerosis (ICD10-I70.0). Electronically Signed   By: Davina Poke D.O.   On: 05/14/2021 12:34      Assessment/Plan Acute appendicitis The patient has been seen and her imaging as well as her lab data has been reviewed.  She has findings c/w acute appendicitis.  We will plan to take her to the OR for a lap appy this evening.  We discussed alternative options such as non-operative management but also discussed the rate of recurrence.  She would prefer to proceed with operative intervention.  I have discussed the procedure and risks of appendectomy. The risks include but are not limited to bleeding, infection, wound problems, anesthesia, injury to intra-abdominal organs, possibility of postoperative ileus. She seems to understand and agrees with the plan.  We also discussed post op care and instructions.  Her husband and daughter (for part of the time)  were present as well and all agree with this plan.  FEN: NPO/IVFs VTE: lovenox to start tomorrow ID: Rocephin/Flagyl in ED Admit to observation  Chronic back pain - treated with diclofenac and lyrica Incidental right adrenal adenoma 3.5cm L adnexal cyst - will need outpatient follow up  Henreitta Cea, [facility] Surgery 05/14/2021, 1:13 PM Please see Amion for pager number during day hours 7:00am-4:30pm

## 2021-05-14 NOTE — Transfer of Care (Signed)
Immediate Anesthesia Transfer of Care Note  Patient: Mikayla Tucker  Procedure(s) Performed: APPENDECTOMY LAPAROSCOPIC (Abdomen)  Patient Location: PACU  Anesthesia Type:General  Level of Consciousness: awake, alert  and oriented  Airway & Oxygen Therapy: Patient Spontanous Breathing  Post-op Assessment: Report given to RN and Post -op Vital signs reviewed and stable  Post vital signs: Reviewed and stable  Last Vitals:  Vitals Value Taken Time  BP 159/85 05/14/21 1847  Temp    Pulse 102 05/14/21 1850  Resp 15 05/14/21 1850  SpO2 95 % 05/14/21 1850  Vitals shown include unvalidated device data.  Last Pain:  Vitals:   05/14/21 1637  TempSrc: Oral  PainSc:          Complications: No notable events documented.

## 2021-05-14 NOTE — Op Note (Signed)
Operative Note  Mikayla Tucker  [id number]  [id number]  05/14/2021   Surgeon: Clovis Riley MD   Assistant: Pryor Curia RNFA and Caswell Corwin RN   Procedure performed: Laparoscopic appendectomy   Preop diagnosis: Acute appendicitis Post-op diagnosis/intraop findings: Acute appendicitis with localized phlegmon and localized peritonitis   Specimens: Appendix Retained items: No EBL: [id number] Complications: none   Description of procedure: After obtaining informed consent the patient was taken to the operating room and placed supine on operating room table where general endotracheal anesthesia was initiated, preoperative antibiotics were administered, SCDs applied, and a formal timeout was performed.  Foley catheter was inserted which is removed at the end of the case.  The abdomen was then prepped and draped in usual sterile fashion.  Peritoneal access was gained using optical entry in the left upper quadrant and insufflation to 15 mmHg ensued without incident.  Under direct visualization an umbilical 12 mm trocar and suprapubic and left lower quadrant 5 mm trochars were inserted after infiltration with local.  The patient was then placed in Trendelenburg and rotated to the left.  The small bowel was swept cephalad and the cecum and appendix were identified.  The appendix is nearly completely retrocecal and encased in a phlegmonous rind.  The base of the appendix was identified and gentle blunt dissection employed to make a small window in the mesoappendix just at its base.  The mesentery was very friable and there was some bleeding from this which was addressed with the harmonic scalpel and ultimately required clip application.  The base of the appendix was transected from the cecum with a white load 45 mm Endo GIA stapler.  The staple line appeared hemostatic, viable and intact.  The harmonic scalpel was then used to divide the intervening mesoappendix, using gentle blunt dissection to break up  the phlegmonous adhesions of the appendix to the posterior lateral aspect of the cecum.  Once freed the appendix was placed in an Endo Catch bag and removed through the 12 mm trocar site at the umbilicus.  The mesoappendix was again inspected and hemostasis was ensured with additional clip placement.  The right lower quadrant was irrigated and aspirated, the effluent was clear.  Irrigation fluid was aspirated from the pelvis as well as over the dome of the liver.  The small bowel was run from the ileocecal valve proximally several feet and no other abnormalities identified.  The omentum was brought down over the staple line.  The fascial defect at the umbilicus was closed with a figure-of-eight 0 Vicryl using laparoscopic suture passer under direct visualization.  The abdomen was then desufflated and all trochars removed.  Skin incisions were closed with subcuticular 4 Monocryl and Dermabond. The patient was then awakened, extubated and taken to PACU in stable condition.    All counts were correct at the completion of the case.

## 2021-05-14 NOTE — Anesthesia Procedure Notes (Addendum)
Procedure Name: Intubation Date/Time: 05/14/2021 5:42 PM Performed by: Carolan Clines, CRNA Pre-anesthesia Checklist: Patient identified, Emergency Drugs available, Suction available and Patient being monitored Patient Re-evaluated:Patient Re-evaluated prior to induction Oxygen Delivery Method: Circle system utilized Preoxygenation: Pre-oxygenation with 100% oxygen Induction Type: IV induction Ventilation: Mask ventilation without difficulty and Oral airway inserted - appropriate to patient size Laryngoscope Size: Mac and 3 Grade View: Grade I Tube type: Oral Tube size: 7.0 mm Number of attempts: 1 Airway Equipment and Method: Stylet and Oral airway Placement Confirmation: ETT inserted through vocal cords under direct vision, positive ETCO2 and breath sounds checked- equal and bilateral Secured at: 22 cm Tube secured with: Tape Dental Injury: Teeth and Oropharynx as per pre-operative assessment

## 2021-05-14 NOTE — ED Provider Notes (Signed)
[facility] Provider Note   CSN: [id number] Arrival date & time: 05/14/21  0732     History Chief Complaint  Patient presents with   Abdominal Pain    Mikayla Tucker is a 60 y.o. female who reports a history of chronic back pain, hyperlipidemia otherwise healthy.  Patient presents for abdominal pain, initially had small amount of diarrhea 2-3 days ago followed by nausea and few episodes of nonbloody/nonbilious emesis.  Abdominal pain began around 2 days ago, she reports it is mostly in her right lower quadrant, moderate-severe intensity sharp constant no clear aggravating or alleviating factors, no radiation of pain.  Patient denies fever/chills, chest pain/shortness of breath, fall/injury, vaginal bleeding/discharge, dysuria/hematuria or any additional concerns.  HPI     Past Medical History:  Diagnosis Date   Allergy    BPPV (benign paroxysmal positional vertigo)    Golfer's elbow, left 05/27/2020   Hyperlipidemia    Lipoma of back 06/14/2014   throracic back, R. US shows lipoma. No intervention recommended.    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 10/23/2020   History of lumbar fusion 08/02/2020   Adrenal adenoma, right 08/06/2017   Macrocytosis without anemia 02/28/2016   Encounter for preventive health examination 02/27/2016   Overweight (BMI 25.0-29.9) 02/27/2016   Prediabetes 11/30/2014   Vitamin D deficiency 11/30/2014   History of adenomatous polyp of colon 06/13/2014   Hyperlipidemia 11/10/2013    Past Surgical History:  Procedure Laterality Date   APPENDECTOMY     BACK SURGERY     BREAST SURGERY     biopsy   CHOLECYSTECTOMY     COLONOSCOPY     LIPOMA EXCISION     under breast on R side     OB History   No obstetric history on file.      Family History  Adopted: Yes  Problem Relation Age of Onset   Colon cancer Neg Hx     Social History   Tobacco Use   Smoking status: Former    Packs/day: 1.00    Years: 10.00    Pack years: 10.00    Types: Cigarettes   Smokeless tobacco: Never   Tobacco comments:    quit 60 yrs old  Vaping Use   Vaping Use: Never used  Substance Use Topics   Alcohol use: Yes    Alcohol/week: 7.0 standard drinks    Types: 7 Glasses of wine per week   Drug use: No    Home Medications Prior to Admission medications   Medication Sig Start Date End Date Taking? Authorizing Provider  atorvastatin (LIPITOR) 80 MG tablet Take 1 tablet (80 mg total) by mouth daily. 02/19/21  Yes Kuneff, Renee A, DO  b complex vitamins capsule Take 1 capsule by mouth daily.   Yes [provider]  Diclofenac Sodium CR 100 MG 24 hr tablet Take 1 tablet (100 mg total) by mouth daily. 02/19/21  Yes Kuneff, Renee A, DO  ezetimibe (ZETIA) 10 MG tablet Take 1 tablet (10 mg total) by mouth daily. 02/19/21  Yes Kuneff, Renee A, DO  fexofenadine (ALLEGRA) 180 MG tablet Take 180 mg by mouth daily.   Yes [provider]  pregabalin (LYRICA) 75 MG capsule Take 1 capsule (75 mg total) by mouth 2 (two) times daily. 02/19/21  Yes Kuneff, Renee A, DO  Vitamin D, Cholecalciferol, 25 MCG (1000 UT) TABS Take 2,000 Units by mouth.   Yes [provider]    Allergies    Hydrocodone-acetaminophen  Review of Systems   Review of Systems Ten systems are reviewed and are negative for acute change except as noted in the HPI  Physical Exam Updated Vital Signs BP (!) 146/93   Pulse (!) 102   Temp 98.4 F (36.9 C) (Oral)   Resp 17   SpO2 95%   Physical Exam Constitutional:      General: She is not in acute distress.    Appearance: Normal appearance. She is well-developed. She is not ill-appearing or diaphoretic.  HENT:     Head: Normocephalic and atraumatic.  Eyes:     General: Vision grossly intact.  Gaze aligned appropriately.     Pupils: Pupils are equal, round, and reactive to light.  Neck:     Trachea: Trachea and phonation normal.  Pulmonary:     Effort: Pulmonary effort is normal. No respiratory distress.  Abdominal:     General: There is no distension.     Palpations: Abdomen is soft.     Tenderness: There is abdominal tenderness in the right lower quadrant. There is no guarding or rebound. Positive signs include Murphy's sign. Negative signs include Rovsing's sign.  Musculoskeletal:        General: Normal range of motion.     Cervical back: Normal range of motion.  Skin:    General: Skin is warm and dry.  Neurological:     Mental Status: She is alert.     GCS: GCS eye subscore is 4. GCS verbal subscore is 5. GCS motor subscore is 6.     Comments: Speech is clear and goal oriented, follows commands Major Cranial nerves without deficit, no facial droop Moves extremities without ataxia, coordination intact  Psychiatric:        Behavior: Behavior normal.    ED Results / Procedures / Treatments   Labs (all labs ordered are listed, but only abnormal results are displayed) Labs Reviewed  COMPREHENSIVE METABOLIC PANEL - Abnormal; Notable for the following components:      Result Value   Sodium 133 (*)    Potassium 3.4 (*)    Glucose, Bld 150 (*)    ALT 58 (*)    Total Bilirubin 1.3 (*)    All other components within normal limits  CBC - Abnormal; Notable for the following components:   WBC 18.8 (*)    MCV 103.0 (*)    MCH 34.9 (*)    All other components within normal limits  URINALYSIS, ROUTINE W REFLEX MICROSCOPIC - Abnormal; Notable for the following components:   APPearance HAZY (*)    Ketones, ur 80 (*)    Protein, ur 100 (*)    Bacteria, UA FEW (*)    All other components within normal limits  RESP PANEL BY RT-PCR (FLU A&B, COVID) ARPGX2  CULTURE, BLOOD (SINGLE)  LIPASE, BLOOD  LACTIC ACID, PLASMA    EKG None  Radiology CT ABDOMEN PELVIS W  CONTRAST  Result Date: 05/14/2021 CLINICAL DATA:  Abdominal abscess/infection suspected EXAM: CT ABDOMEN AND PELVIS WITH CONTRAST TECHNIQUE: Multidetector CT imaging of the abdomen and pelvis was performed using the standard protocol following bolus administration of intravenous contrast. CONTRAST:  116m OMNIPAQUE IOHEXOL 350 MG/ML SOLN COMPARISON:  11/25/2018, MRI 08/08/2017 FINDINGS: Lower chest: Mild left basilar atelectasis. Heart size within normal limits. Hepatobiliary: Diffusely  decreased attenuation of the hepatic parenchyma. No focal hepatic lesion is identified. Surgically absent gallbladder. No biliary dilatation. Pancreas: Unremarkable. No pancreatic ductal dilatation or surrounding inflammatory changes. Spleen: Normal in size without focal abnormality. Adrenals/Urinary Tract: 2.7 x 2.0 cm right adrenal gland nodule, which has been previously characterized as a benign adrenal adenoma. Normal left adrenal gland. Kidneys enhance symmetrically. No focal renal lesion, stone, or hydronephrosis. Urinary bladder is within normal limits. Stomach/Bowel: Dilated, inflamed appendix with periappendiceal fat stranding and trace free fluid. No organized periappendiceal fluid collection. No extraluminal air. Stomach and small bowel within normal limits. No colonic wall thickening. Vascular/Lymphatic: Scattered aortoiliac atherosclerotic calcifications without aneurysm. Circumaortic left renal vein, an anatomic variant no abdominopelvic lymphadenopathy. Reproductive: 3.5 cm simple fluid attenuation cyst within the left adnexa. No right adnexal abnormality. Uterus within normal limits. Other: No ascites.  No pneumoperitoneum.  No abdominal wall hernia. Musculoskeletal: Prior L4-5 PLIF.  No acute osseous abnormality. IMPRESSION: 1. Acute uncomplicated appendicitis. 2. Hepatic steatosis. 3. Slight interval increase in size of a 2.7 cm right adrenal gland nodule, which has been previously characterized as a benign adrenal  adenoma. 4. 3.5 cm simple fluid attenuation cyst within the left adnexa. Recommend follow-up US in 6-12 months. Note: This recommendation does not apply to premenarchal patients and to those with increased risk (genetic, family history, elevated tumor markers or other high-risk factors) of ovarian cancer. Reference: JACR 2020 Feb; 17(2):248-254 Aortic Atherosclerosis (ICD10-I70.0). Electronically Signed   By: Davina Poke D.O.   On: 05/14/2021 12:34    Procedures Procedures   Medications Ordered in ED Medications  cefTRIAXone (ROCEPHIN) 2 g in sodium chloride 0.9 % 100 mL IVPB (2 g Intravenous New Bag/Given 05/14/21 1259)    And  metroNIDAZOLE (FLAGYL) IVPB 500 mg (500 mg Intravenous New Bag/Given 05/14/21 1301)  fentaNYL (SUBLIMAZE) injection 50 mcg (has no administration in time range)  sodium chloride 0.9 % bolus 1,000 mL (0 mLs Intravenous Stopped 05/14/21 1249)  fentaNYL (SUBLIMAZE) injection 50 mcg (50 mcg Intravenous Given 05/14/21 1120)  iohexol (OMNIPAQUE) 350 MG/ML injection 100 mL (100 mLs Intravenous Contrast Given 05/14/21 1205)  sodium chloride 0.9 % bolus 1,000 mL (1,000 mLs Intravenous New Bag/Given 05/14/21 1257)    ED Course  I have reviewed the triage vital signs and the nursing notes.  Pertinent labs & imaging results that were available during my care of the patient were reviewed by me and considered in my medical decision making (see chart for details).  Clinical Course as of 05/14/21 1321  Tue May 14, 2021  1242 [facility] [BM]    Clinical Course User Index [BM] Gari Crown   MDM Rules/Calculators/A&P                          Additional history obtained from: Nursing notes from this visit. Review of electronic medical records. Family, patient's husband at bedside. ----------------- 60 year old female presented for right lower quadrant abdominal pain nausea vomiting and small amount of diarrhea.  On exam she was tender in the right lower  quadrant, history and exam is concerning for possible appendicitis.  Risk versus benefits of CT imaging were discussed with patient and her husband and they elected to continue with CT imaging at this visit.  IV fluids and pain medication were ordered.  Basic labs which were obtained in triage were reviewed and include a CBC, CMP, lipase and urinalysis.  CBC showed leukocytosis of 18.8 which is  concerning for infection, no anemia or thrombocytopenia. CMP showed no emergent electrolyte derangement or AKI.  No anion gap.  Minimal elevation of total bilirubin and ALT. Urinalysis shows some ketones suggestive of dehydration, no evidence for UTI. Lipase within normal limits, doubt pancreatitis. - Patient was initially tachycardic in triage this has resolved without intervention on my evaluation.  Patient does have a leukocytosis but I do not feel patient is septic at this time.  She is overall well-appearing and in no acute distress.  Will await CT imaging prior to initiation of antibiotics, do not feel patient necessitates 30 cc/kg fluid bolus at this time as blood pressure is stable.  This was discussed with attending physician Dr. Almyra Free who is in agreement.  I have added on a lactate, blood culture and COVID test to patient's work-up. - Lactic acid within normal limits is reassuring. COVID test negative.  CTAP:  IMPRESSION:  1. Acute uncomplicated appendicitis.  2. Hepatic steatosis.  3. Slight interval increase in size of a 2.7 cm right adrenal gland  nodule, which has been previously characterized as a benign adrenal  adenoma.  4. 3.5 cm simple fluid attenuation cyst within the left adnexa.  Recommend follow-up US in 6-12 months. Note: This recommendation  does not apply to premenarchal patients and to those with increased  risk (genetic, family history, elevated tumor markers or other  high-risk factors) of ovarian cancer. Reference: JACR 2020 Feb;  17(2):248-254     Aortic Atherosclerosis  (ICD10-I70.0).   Patient was updated on findings above and states understanding, patient remained stable on reevaluation.  Vital signs stable, she is in no acute distress.  Pain improved after fentanyl.  Consult placed to general surgery. - Consulted with Maximiano Coss from general surgery at 12:42 PM, patient was accepted to their service.   Note: Portions of this report may have been transcribed using voice recognition software. Every effort was made to ensure accuracy; however, inadvertent computerized transcription errors may still be present.  Final Clinical Impression(s) / ED Diagnoses Final diagnoses:  Acute appendicitis with localized peritonitis, without perforation, abscess, or gangrene    Rx / DC Orders ED Discharge Orders     None        Gari Crown 05/14/21 1321    Luna Fuse, MD 05/18/21 1512

## 2021-05-14 NOTE — Interval H&P Note (Signed)
History and Physical Interval Note:  05/14/2021 5:18 PM  Mikayla Tucker  has presented today for surgery, with the diagnosis of appendicitis.  The various methods of treatment have been discussed with the patient and family. After consideration of risks, benefits and other options for treatment, the patient has consented to  Procedure(s): APPENDECTOMY LAPAROSCOPIC (N/A) as a surgical intervention.  The patient's history has been reviewed, patient examined, no change in status, stable for surgery.  I have reviewed the patient's chart and labs.  Questions were answered to the patient's satisfaction.     [name] Rich Brave

## 2021-05-15 ENCOUNTER — Encounter (HOSPITAL_COMMUNITY): Payer: Self-pay | Admitting: Surgery

## 2021-05-15 LAB — BASIC METABOLIC PANEL
Anion gap: 10 (ref 5–15)
BUN: 6 mg/dL (ref 6–20)
CO2: 23 mmol/L (ref 22–32)
Calcium: 8.6 mg/dL — ABNORMAL LOW (ref 8.9–10.3)
Chloride: 102 mmol/L (ref 98–111)
Creatinine, Ser: 0.61 mg/dL (ref 0.44–1.00)
GFR, Estimated: >60 mL/min (ref >60)
Glucose, Bld: 164 mg/dL — ABNORMAL HIGH (ref 70–99)
Potassium: 3.5 mmol/L (ref 3.5–5.1)
Sodium: 135 mmol/L (ref 135–145)

## 2021-05-15 LAB — CBC
HCT: 34.9 % — ABNORMAL LOW (ref 36.0–46.0)
Hemoglobin: 11.9 g/dL — ABNORMAL LOW (ref 12.0–15.0)
MCH: 35.6 pg — ABNORMAL HIGH (ref 26.0–34.0)
MCHC: 34.1 g/dL (ref 30.0–36.0)
MCV: 104.5 fL — ABNORMAL HIGH (ref 80.0–100.0)
Platelets: 254 10*3/uL (ref 150–400)
RBC: 3.34 MIL/uL — ABNORMAL LOW (ref 3.87–5.11)
RDW: 12.4 % (ref 11.5–15.5)
WBC: 16 10*3/uL — ABNORMAL HIGH (ref 4.0–10.5)
nRBC: 0 % (ref 0.0–0.2)

## 2021-05-15 MED ORDER — OXYCODONE HCL 5 MG PO TABS: 5.0000 mg | ORAL_TABLET | Freq: Four times a day (QID) | ORAL | 0 refills | Status: AC | PRN

## 2021-05-15 MED ORDER — ACETAMINOPHEN 500 MG PO TABS: 1000.0000 mg | ORAL_TABLET | Freq: Four times a day (QID) | ORAL | 0 refills | Status: AC

## 2021-05-15 MED ORDER — DOCUSATE SODIUM 100 MG PO CAPS: 100.0000 mg | ORAL_CAPSULE | Freq: Two times a day (BID) | ORAL | 0 refills | Status: AC

## 2021-05-15 MED ORDER — DICLOFENAC SODIUM 25 MG PO TBEC
50.0000 mg | DELAYED_RELEASE_TABLET | Freq: Two times a day (BID) | ORAL | Status: DC
  Administered 2021-05-15: 50 mg via ORAL
  Filled 2021-05-15 (×2): qty 2
  Filled 2021-05-15: qty 1

## 2021-05-15 NOTE — Progress Notes (Signed)
Discharge instructions (including medications) discussed with and copy provided to patient/caregiver 

## 2021-05-15 NOTE — Discharge Summary (Signed)
[facility] Discharge Summary   Patient ID: Mikayla Tucker MRN: [id number] DOB/AGE: 1961-07-18 60 y.o.  Admit date: 05/14/2021 Discharge date: 05/15/2021  Admitting Diagnosis: Acute appendicitis  Discharge Diagnosis Acute appendicitis  Imaging: CT ABDOMEN PELVIS W CONTRAST  Result Date: 05/14/2021 CLINICAL DATA:  Abdominal abscess/infection suspected EXAM: CT ABDOMEN AND PELVIS WITH CONTRAST TECHNIQUE: Multidetector CT imaging of the abdomen and pelvis was performed using the standard protocol following bolus administration of intravenous contrast. CONTRAST:  144m OMNIPAQUE IOHEXOL 350 MG/ML SOLN COMPARISON:  11/25/2018, MRI 08/08/2017 FINDINGS: Lower chest: Mild left basilar atelectasis. Heart size within normal limits. Hepatobiliary: Diffusely decreased attenuation of the hepatic parenchyma. No focal hepatic lesion is identified. Surgically absent gallbladder. No biliary dilatation. Pancreas: Unremarkable. No pancreatic ductal dilatation or surrounding inflammatory changes. Spleen: Normal in size without focal abnormality. Adrenals/Urinary Tract: 2.7 x 2.0 cm right adrenal gland nodule, which has been previously characterized as a benign adrenal adenoma. Normal left adrenal gland. Kidneys enhance symmetrically. No focal renal lesion, stone, or hydronephrosis. Urinary bladder is within normal limits. Stomach/Bowel: Dilated, inflamed appendix with periappendiceal fat stranding and trace free fluid. No organized periappendiceal fluid collection. No extraluminal air. Stomach and small bowel within normal limits. No colonic wall thickening. Vascular/Lymphatic: Scattered aortoiliac atherosclerotic calcifications without aneurysm. Circumaortic left renal vein, an anatomic variant no abdominopelvic lymphadenopathy. Reproductive: 3.5 cm simple fluid attenuation cyst within the left adnexa. No right adnexal abnormality. Uterus within normal limits. Other: No ascites.  No pneumoperitoneum.  No  abdominal wall hernia. Musculoskeletal: Prior L4-5 PLIF.  No acute osseous abnormality. IMPRESSION: 1. Acute uncomplicated appendicitis. 2. Hepatic steatosis. 3. Slight interval increase in size of a 2.7 cm right adrenal gland nodule, which has been previously characterized as a benign adrenal adenoma. 4. 3.5 cm simple fluid attenuation cyst within the left adnexa. Recommend follow-up [facility] 6-12 months. Note: This recommendation does not apply to premenarchal patients and to those with increased risk (genetic, family history, elevated tumor markers or other high-risk factors) of ovarian cancer. Reference: JACR 2020 Feb; 17(2):248-254 Aortic Atherosclerosis (ICD10-I70.0). Electronically Signed   By: NDavina PokeD.O.   On: 05/14/2021 12:34    Procedures Dr. CRomana Juniper(05/14/21) - Laparoscopic Appendectomy  Hospital Course:  Patient is a 60y/o female with a history of chronic back pain and high cholesterol who began having some RLQ abdominal pain morning prior to admission.  She had associated nausea and vomiting as well as some diarrhea.  She had fevers as high as 100.9.  she had no appetite and didn't really eat or drink.  Her pain persisted and nothing seemed to help it.  She finally presented to the [facility] early 05/14/21 for evaluation.  She was found to have a WBC of 18K and a CT scan concerning for acute uncomplicated appendicitis.  We were asked to see her for further evaluation and treatment. Patient was admitted and underwent procedure listed above.  Tolerated procedure well and was transferred to the floor.  Diet was advanced as tolerated.  On POD1, the patient was voiding well, tolerating diet, ambulating well, pain well controlled, vital signs stable, incisions c/d/i and felt stable for discharge home.  Patient will follow up in our office in 3 weeks and knows to call with questions or concerns.   She was discharged with oxycodone '5mg'$  #20/0 for pain control  Physical Exam: General:  Alert,  NAD, pleasant, comfortable Abd:  Soft, mildly distended, mild tenderness, incisions C/D/I  I or a member of my  team have reviewed this patient in the Controlled Substance Database.   Allergies as of 05/15/2021       Reactions   Hydrocodone-acetaminophen Hives, Other (See Comments)   Pt states that she does not recall having a reaction to this medication. She may have taken this med after getting her appendix removed.        Medication List     TAKE these medications    acetaminophen 500 MG tablet Commonly known as: TYLENOL Take 2 tablets (1,000 mg total) by mouth every 6 (six) hours for 4 days.   atorvastatin 80 MG tablet Commonly known as: LIPITOR Take 1 tablet (80 mg total) by mouth daily.   b complex vitamins capsule Take 1 capsule by mouth daily.   Diclofenac Sodium CR 100 MG 24 hr tablet Take 1 tablet (100 mg total) by mouth daily.   docusate sodium 100 MG capsule Commonly known as: COLACE Take 1 capsule (100 mg total) by mouth 2 (two) times daily for 3 days.   ezetimibe 10 MG tablet Commonly known as: ZETIA Take 1 tablet (10 mg total) by mouth daily.   fexofenadine 180 MG tablet Commonly known as: ALLEGRA Take 180 mg by mouth daily.   oxyCODONE 5 MG immediate release tablet Commonly known as: Oxy IR/ROXICODONE Take 1 tablet (5 mg total) by mouth every 6 (six) hours as needed for up to 5 days for moderate pain or severe pain.   pregabalin 75 MG capsule Commonly known as: Lyrica Take 1 capsule (75 mg total) by mouth 2 (two) times daily.   Vitamin D (Cholecalciferol) 25 MCG (1000 UT) Tabs Take 2,000 Units by mouth.          Follow-up Information     [facility] Follow up on 06/06/2021.   Specialty: General Surgery Why: [phone] Please follow up on 06/06/21 at 2:00 pto complete the check in process for a 2:30 pm appointment. Please bring insurance card and photo ID Contact information: [place]  [address] [phone]                 Signed: Winferd Humphrey , [facility] Surgery 05/15/2021, 9:28 AM Please see Amion for pager number during day hours 7:00am-4:30pm

## 2021-05-15 NOTE — Plan of Care (Signed)
  Problem: Education: Goal: Knowledge of General Education information will improve Description: Including pain rating scale, medication(s)/side effects and non-pharmacologic comfort measures Outcome: Adequate for Discharge   

## 2021-05-15 NOTE — Discharge Instructions (Signed)
[facility] ______[facility] LAPAROSCOPIC SURGERY: POST OP INSTRUCTIONS Always review your discharge instruction sheet given to you by the facility where your surgery was performed. IF YOU HAVE DISABILITY OR FAMILY LEAVE FORMS, YOU MUST BRING THEM TO THE OFFICE FOR PROCESSING.   DO NOT GIVE THEM TO YOUR DOCTOR.  A prescription for pain medication may be given to you upon discharge.  Take your pain medication as prescribed, if needed.  If narcotic pain medicine is not needed, then you may take acetaminophen (Tylenol) or ibuprofen (Advil) as needed. Take your usually prescribed medications unless otherwise directed. If you need a refill on your pain medication, please contact your pharmacy.  They will contact our office to request authorization. Prescriptions will not be filled after 5pm or on week-ends. You should follow a light diet the first few days after arrival home, such as soup and crackers, etc.  Be sure to include lots of fluids daily. Most patients will experience some swelling and bruising in the area of the incisions.  Ice packs will help.  Swelling and bruising can take several days to resolve.  It is common to experience some constipation if taking pain medication after surgery.  Increasing fluid intake and taking a stool softener (such as Colace) will usually help or prevent this problem from occurring.  A mild laxative (Milk of Magnesia or Miralax) should be taken according to package instructions if there are no bowel movements after 48 hours. Unless discharge instructions indicate otherwise, you may remove your bandages 24-48 hours after surgery, and you may shower at that time.  You may have steri-strips (small skin tapes) in place directly over the incision.  These strips should be left on the skin for 7-10 days.  If your surgeon used skin glue on the incision, you may shower in 24 hours.  The glue will flake off over the next 2-3 weeks.  Any sutures or staples will be  removed at the office during your follow-up visit. ACTIVITIES:  You may resume regular (light) daily activities beginning the next day--such as daily self-care, walking, climbing stairs--gradually increasing activities as tolerated.  You may have sexual intercourse when it is comfortable.  Refrain from any heavy lifting or straining until approved by your doctor. You may drive when you are no longer taking prescription pain medication, you can comfortably wear a seatbelt, and you can safely maneuver your car and apply brakes. RETURN TO WORK:  __________________________________________________________ You should see your doctor in the office for a follow-up appointment approximately 2-3 weeks after your surgery.  Make sure that you call for this appointment within a day or two after you arrive home to insure a convenient appointment time. OTHER INSTRUCTIONS: __________________________________________________________________________________________________________________________ __________________________________________________________________________________________________________________________ WHEN TO CALL YOUR DOCTOR: Fever over 101.0 Inability to urinate Continued bleeding from incision. Increased pain, redness, or drainage from the incision. Increasing abdominal pain  The clinic staff is available to answer your questions during regular business hours.  Please don't hesitate to call and ask to speak to one of the nurses for clinical concerns.  If you have a medical emergency, go to the nearest emergency room or call 911.  A surgeon from [facility] is always on call at the hospital. [address], [address], [place], [place]  [address] ? [address] [address], [place], [place]   [address] [phone] ? [phone] ? FAX [phone] Web site: [email or web address]  

## 2021-05-17 LAB — SURGICAL PATHOLOGY

## 2021-05-19 LAB — CULTURE, BLOOD (SINGLE)
Culture: NO GROWTH
Special Requests: ADEQUATE

## 2021-05-22 ENCOUNTER — Other Ambulatory Visit: Payer: Self-pay | Admitting: Radiology

## 2021-05-22 DIAGNOSIS — N6310 Unspecified lump in the right breast, unspecified quadrant: Secondary | ICD-10-CM

## 2021-05-28 ENCOUNTER — Ambulatory Visit
Admission: RE | Admit: 2021-05-28 | Discharge: 2021-05-28 | Disposition: A | Discharge status: Home / Self Care | Payer: Commercial Managed Care - PPO | Source: Ambulatory Visit | Attending: Radiology | Admitting: Radiology

## 2021-05-28 ENCOUNTER — Other Ambulatory Visit: Payer: Self-pay

## 2021-05-28 ENCOUNTER — Telehealth: Payer: Self-pay | Admitting: Family Medicine

## 2021-05-28 DIAGNOSIS — N6310 Unspecified lump in the right breast, unspecified quadrant: Secondary | ICD-10-CM

## 2021-05-28 MED ORDER — PREGABALIN 75 MG PO CAPS: 75.0000 mg | ORAL_CAPSULE | Freq: Two times a day (BID) | ORAL | 0 refills | Status: DC

## 2021-05-28 MED ORDER — DICLOFENAC SODIUM ER 100 MG PO TB24: 100.0000 mg | ORAL_TABLET | Freq: Every day | ORAL | 0 refills | Status: DC

## 2021-05-28 NOTE — Telephone Encounter (Signed)
Patient requesting refills of diclofenac and Lyrica. States she has 4 days left and has appt scheduled 06/03/21.

## 2021-05-28 NOTE — Telephone Encounter (Signed)
30 d/s sent to local pharmacy.

## 2021-06-03 ENCOUNTER — Encounter: Payer: Self-pay | Admitting: Family Medicine

## 2021-06-03 ENCOUNTER — Other Ambulatory Visit: Payer: Self-pay

## 2021-06-03 ENCOUNTER — Ambulatory Visit (INDEPENDENT_AMBULATORY_CARE_PROVIDER_SITE_OTHER): Payer: Commercial Managed Care - PPO | Admitting: Family Medicine

## 2021-06-03 ENCOUNTER — Other Ambulatory Visit: Payer: Commercial Managed Care - PPO

## 2021-06-03 VITALS — BP 119/77 | HR 77 | Temp 98.4°F | Ht 64.0 in | Wt 153.0 lb

## 2021-06-03 DIAGNOSIS — Z981 Arthrodesis status: Secondary | ICD-10-CM | POA: Diagnosis not present

## 2021-06-03 DIAGNOSIS — Z23 Encounter for immunization: Secondary | ICD-10-CM

## 2021-06-03 DIAGNOSIS — M5116 Intervertebral disc disorders with radiculopathy, lumbar region: Secondary | ICD-10-CM

## 2021-06-03 MED ORDER — PREGABALIN 75 MG PO CAPS: 75.0000 mg | ORAL_CAPSULE | Freq: Two times a day (BID) | ORAL | 5 refills | Status: DC

## 2021-06-03 MED ORDER — DICLOFENAC SODIUM ER 100 MG PO TB24: 100.0000 mg | ORAL_TABLET | Freq: Every day | ORAL | 1 refills | Status: DC

## 2021-06-03 MED ORDER — TRAZODONE HCL 50 MG PO TABS: 25.0000 mg | ORAL_TABLET | Freq: Every evening | ORAL | 1 refills | Status: DC | PRN

## 2021-06-03 NOTE — Progress Notes (Signed)
This visit occurred during the SARS-CoV-2 public health emergency.  Safety protocols were in place, including screening questions prior to the visit, additional usage of staff PPE, and extensive cleaning of exam room while observing appropriate contact time as indicated for disinfecting solutions.    Patient ID: Mikayla [place], female  DOB: [birth year], 60 y.o.   MRN: [id number] Patient Care Team    Relationship Specialty Notifications Start End  Ma Hillock, DO PCP - General Family Medicine  08/07/15   Kennon Holter, NP Nurse Practitioner Nurse Practitioner  02/27/16    Comment: Gynecology  Gatha Mayer, MD Consulting Physician Gastroenterology  04/30/17     Chief Complaint  Patient presents with   Back Pain    Chronic medical conditions    Subjective:  Mikayla [place] is a 60 y.o.  Female  present for [facility] Prediabetes Patient reports she has been changing her diet.  She has lost weight.  She is attempting to exercise but has difficulty with her lower back discomfort.  She recently was in the [place] for appendicitis and had her appendix removed approximately 1 month ago.  Sleep disturbance: She reports she has not been sleeping well recently.  She is uncertain if it is because she cannot get comfortable reports she just has anxiety.  She is unable to tolerate melatonin secondary to nightmares.  She had been prescribed trazodone at 1 time in the past.  Mixed hyperlipidemia/Overweight (BMI 25.0-29.9) Patient reports compliance with atorvastatin 80 mg daily and Zetia 10 mg daily.  Macrocytosis without anemia Has been stable without decrease in other cell lines.  She had follow-up with hematology, which felt that she could continue to follow-up every 6 months with PCP with CBC.  She reports she has not been drinking much alcohol.  Vitamin D deficiency She is supplementing.  History of lumbar fusion/Herniation of nucleus pulposus of lumbar intervertebral disc with  sciatica Patient reports she has seen Dr. Rolena Infante for her lower back and receiving injections.  She does have continued discomfort.  She states the Voltaren is very helpful but she feels it wears off in the afternoon.   SHe has been on gabapentin, which we switched to Lyrica 75 mg twice daily at her last appointment secondary to gabapentin causing sedation.  She reports initially she thought it was helpful, but then she thought it started wearing off.  She admits she had not been taking it routinely twice a day.  Depression screen Mikayla [place] 2/9 02/19/2021 06/27/2020 05/09/2019 06/03/2018 11/03/2017  Decreased Interest 0 0 0 0 0  Down, Depressed, Hopeless 0 0 0 0 0  PHQ - 2 Score 0 0 0 0 0   GAD 7 : Generalized Anxiety Score 05/09/2019 06/03/2018  Nervous, Anxious, on Edge 0 0  Control/stop worrying 0 0  Worry too much - different things 0 0  Trouble relaxing 0 0  Restless 0 0  Easily annoyed or irritable 0 0  Afraid - awful might happen 0 0  Total GAD 7 Score 0 0  Anxiety Difficulty Not difficult at all -    Immunization History  Administered Date(s) Administered   Influenza,inj,Quad PF,6+ Mos 06/13/2014, 11/12/2016, 06/03/2018, 06/29/2019, 06/27/2020, 06/03/2021   PFIZER(Purple Top)SARS-COV-2 Vaccination 12/16/2019, 01/06/2020, 09/07/2020   Tdap 02/27/2016   Zoster Recombinat (Shingrix) 06/03/2018, 08/20/2018     Past Medical History:  Diagnosis Date   Acute appendicitis 05/14/2021   Allergy    BPPV (benign paroxysmal positional vertigo)    Golfer's elbow,  left 05/27/2020   Hyperlipidemia    Lipoma of back 06/14/2014   throracic back, R. US shows lipoma. No intervention recommended.    Rotator cuff tendinitis, right 07/22/2018   Squamous cell skin cancer    SCC   Allergies  Allergen Reactions   Hydrocodone-Acetaminophen Hives and Other (See Comments)    Pt states that she does not recall having a reaction to this medication. She may have taken this med after getting her appendix removed.    Past Surgical History:  Procedure Laterality Date   APPENDECTOMY     BACK SURGERY     BREAST SURGERY     biopsy   CHOLECYSTECTOMY     COLONOSCOPY     LAPAROSCOPIC APPENDECTOMY N/A 05/14/2021   Procedure: APPENDECTOMY LAPAROSCOPIC;  Surgeon: Clovis Riley, MD;  Location: [place];  Service: General;  Laterality: N/A;   LIPOMA EXCISION     under breast on R side   Family History  Adopted: Yes  Problem Relation Age of Onset   Colon cancer Neg Hx    Social History   Social History Narrative   [name] lives with her husband and daughter. She is originally from [place]-moved to [place]. She relocated to [place] about [place] from [place].    Allergies as of 06/03/2021       Reactions   Hydrocodone-acetaminophen Hives, Other (See Comments)   Pt states that she does not recall having a reaction to this medication. She may have taken this med after getting her appendix removed.        Medication List        Accurate as of June 03, 2021 11:53 AM. If you have any questions, ask your nurse or doctor.          atorvastatin 80 MG tablet Commonly known as: LIPITOR Take 1 tablet (80 mg total) by mouth daily.   b complex vitamins capsule Take 1 capsule by mouth daily.   Diclofenac Sodium CR 100 MG 24 hr tablet Take 1 tablet (100 mg total) by mouth daily.   ezetimibe 10 MG tablet Commonly known as: ZETIA Take 1 tablet (10 mg total) by mouth daily.   fexofenadine 180 MG tablet Commonly known as: ALLEGRA Take 180 mg by mouth daily.   pregabalin 75 MG capsule Commonly known as: Lyrica Take 1 capsule (75 mg total) by mouth 2 (two) times daily.   traZODone 50 MG tablet Commonly known as: DESYREL Take 0.5-1 tablets (25-50 mg total) by mouth at bedtime as needed for sleep. Started by: Howard Pouch, DO   Vitamin D (Cholecalciferol) 25 MCG (1000 UT) Tabs Take 2,000 Units by mouth.        All past medical history, surgical history, allergies, family history,  immunizations andmedications were updated in the EMR today and reviewed under the history and medication portions of their EMR.       ROS: 14 pt review of systems performed and negative (unless mentioned in an HPI)  Objective: BP 119/77   Pulse 77   Temp 98.4 F (36.9 C) (Oral)   Ht '5\' 4"'$  (1.626 m)   Wt 153 lb (69.4 kg)   SpO2 96%   BMI 26.26 kg/m  Gen: Afebrile. No acute distress.  Nontoxic, pleasant female. HENT: AT. [place].  Eyes:Pupils Equal Round Reactive to light, Extraocular movements intact,  Conjunctiva without redness, discharge or icterus. CV: RRR no murmur, no edema Chest: CTAB, no wheeze or crackles Skin: No rashes, purpura  or petechiae.  Neuro:  Normal gait. PERLA. EOMi. Alert. Oriented x3 Psych: Normal affect, dress and demeanor. Normal speech. Normal thought content and judgment.    No results found.  Assessment/plan: Mikayla [place] is a 60 y.o. female present for Mixed hyperlipidemia/overweight Stable.   Continue Zetia and Lipitor 80 mg daily  - follow yearly-fasting labs due next visit at cpe  Macrocytosis without anemia CBC reviewed from hosp visit 05/15/2021 for appendicitis.  Has seen heme and only needs to follow again if other cell lines are abnormal.  Cbc q 6 mos follow up recommend by provider appt. If macrocytosis is present.  B12, MMA will be collected at cpe next month.   Vitamin D deficiency/hypocalcemia - supplementing 2000u qd -labs due next visit vit d and calcium  Prediabetes/overweight Patient has been doing very well with her exercise and diet.  She has lost weight. A1c is 5.2 today.  Congratulated her this is a great change from last A1c of 6.0. Will collect at cpe next month.   History of lumbar fusion/Herniation of nucleus pulposus of lumbar intervertebral disc with sciatica Continue follow-ups with Dr. Rolena Infante.  Patient has decided to move forward with surgical correction. Continue Voltaren 100 mg daily Continue Lyrica 75 mg p.o.  increase to twice daily.   -Briefly discussed Cymbalta and Celebrex as well today  Sleep disturbance: Trial of trazodone 25 mg to 75 mg taper.  Patient has her CPE scheduled in 4 weeks, we will follow-up on the above lab work and changes in medication regimens at that time.      Orders Placed This Encounter  Procedures   Flu Vaccine QUAD 6+ mos PF IM (Fluarix Quad PF)   Meds ordered this encounter  Medications   Diclofenac Sodium CR 100 MG 24 hr tablet    Sig: Take 1 tablet (100 mg total) by mouth daily.    Dispense:  90 tablet    Refill:  1   traZODone (DESYREL) 50 MG tablet    Sig: Take 0.5-1 tablets (25-50 mg total) by mouth at bedtime as needed for sleep.    Dispense:  90 tablet    Refill:  1   pregabalin (LYRICA) 75 MG capsule    Sig: Take 1 capsule (75 mg total) by mouth 2 (two) times daily.    Dispense:  60 capsule    Refill:  5    Referral Orders  No referral(s) requested today     Electronically signed by: Howard Pouch, [place]

## 2021-06-28 ENCOUNTER — Ambulatory Visit (INDEPENDENT_AMBULATORY_CARE_PROVIDER_SITE_OTHER): Payer: Commercial Managed Care - PPO | Admitting: Family Medicine

## 2021-06-28 ENCOUNTER — Encounter: Payer: Self-pay | Admitting: Family Medicine

## 2021-06-28 ENCOUNTER — Other Ambulatory Visit: Payer: Self-pay

## 2021-06-28 VITALS — BP 118/78 | HR 77 | Temp 98.4°F | Ht 64.0 in | Wt 151.0 lb

## 2021-06-28 DIAGNOSIS — E663 Overweight: Secondary | ICD-10-CM

## 2021-06-28 DIAGNOSIS — Z86010 Personal history of colonic polyps: Secondary | ICD-10-CM

## 2021-06-28 DIAGNOSIS — R790 Abnormal level of blood mineral: Secondary | ICD-10-CM

## 2021-06-28 DIAGNOSIS — R7303 Prediabetes: Secondary | ICD-10-CM

## 2021-06-28 DIAGNOSIS — E538 Deficiency of other specified B group vitamins: Secondary | ICD-10-CM | POA: Diagnosis not present

## 2021-06-28 DIAGNOSIS — Z0000 Encounter for general adult medical examination without abnormal findings: Secondary | ICD-10-CM

## 2021-06-28 DIAGNOSIS — E559 Vitamin D deficiency, unspecified: Secondary | ICD-10-CM | POA: Diagnosis not present

## 2021-06-28 DIAGNOSIS — I700 Atherosclerosis of aorta: Secondary | ICD-10-CM | POA: Insufficient documentation

## 2021-06-28 DIAGNOSIS — D7589 Other specified diseases of blood and blood-forming organs: Secondary | ICD-10-CM

## 2021-06-28 DIAGNOSIS — E782 Mixed hyperlipidemia: Secondary | ICD-10-CM

## 2021-06-28 LAB — COMPREHENSIVE METABOLIC PANEL
ALT: 87 U/L — ABNORMAL HIGH (ref 0–35)
AST: 50 U/L — ABNORMAL HIGH (ref 0–37)
Albumin: 4.7 g/dL (ref 3.5–5.2)
Alkaline Phosphatase: 103 U/L (ref 39–117)
BUN: 13 mg/dL (ref 6–23)
CO2: 27 mEq/L (ref 19–32)
Calcium: 10 mg/dL (ref 8.4–10.5)
Chloride: 100 mEq/L (ref 96–112)
Creatinine, Ser: 0.67 mg/dL (ref 0.40–1.20)
GFR: 95.62 mL/min (ref >60.00)
Glucose, Bld: 86 mg/dL (ref 70–99)
Potassium: 4.8 mEq/L (ref 3.5–5.1)
Sodium: 137 mEq/L (ref 135–145)
Total Bilirubin: 0.6 mg/dL (ref 0.2–1.2)
Total Protein: 7.8 g/dL (ref 6.0–8.3)

## 2021-06-28 LAB — CBC WITH DIFFERENTIAL/PLATELET
Basophils Absolute: 0 10*3/uL (ref 0.0–0.1)
Basophils Relative: 0.6 % (ref 0.0–3.0)
Eosinophils Absolute: 0.2 10*3/uL (ref 0.0–0.7)
Eosinophils Relative: 2.1 % (ref 0.0–5.0)
HCT: 39.7 % (ref 36.0–46.0)
Hemoglobin: 13.4 g/dL (ref 12.0–15.0)
Lymphocytes Relative: 30.5 % (ref 12.0–46.0)
Lymphs Abs: 2.2 10*3/uL (ref 0.7–4.0)
MCHC: 33.8 g/dL (ref 30.0–36.0)
MCV: 104.7 fl — ABNORMAL HIGH (ref 78.0–100.0)
Monocytes Absolute: 0.5 10*3/uL (ref 0.1–1.0)
Monocytes Relative: 6.7 % (ref 3.0–12.0)
Neutro Abs: 4.4 10*3/uL (ref 1.4–7.7)
Neutrophils Relative %: 60.1 % (ref 43.0–77.0)
Platelets: 318 10*3/uL (ref 150.0–400.0)
RBC: 3.8 Mil/uL — ABNORMAL LOW (ref 3.87–5.11)
RDW: 13.1 % (ref 11.5–15.5)
WBC: 7.3 10*3/uL (ref 4.0–10.5)

## 2021-06-28 LAB — B12 AND FOLATE PANEL
Folate: >23.4 ng/mL (ref >5.9)
Vitamin B-12: 509 pg/mL (ref 211–911)

## 2021-06-28 LAB — TSH: TSH: 1.25 u[IU]/mL (ref 0.35–5.50)

## 2021-06-28 LAB — LIPID PANEL
Cholesterol: 244 mg/dL — ABNORMAL HIGH (ref 0–200)
HDL: 65.4 mg/dL (ref >39.00)
LDL Cholesterol: 141 mg/dL — ABNORMAL HIGH (ref 0–99)
NonHDL: 179.08
Total CHOL/HDL Ratio: 4
Triglycerides: 192 mg/dL — ABNORMAL HIGH (ref 0.0–149.0)
VLDL: 38.4 mg/dL (ref 0.0–40.0)

## 2021-06-28 LAB — HEMOGLOBIN A1C: Hgb A1c MFr Bld: 5.9 % (ref 4.6–6.5)

## 2021-06-28 LAB — VITAMIN D 25 HYDROXY (VIT D DEFICIENCY, FRACTURES): VITD: 40.45 ng/mL (ref 30.00–100.00)

## 2021-06-28 NOTE — Progress Notes (Addendum)
This visit occurred during the SARS-CoV-2 public health emergency.  Safety protocols were in place, including screening questions prior to the visit, additional usage of staff PPE, and extensive cleaning of exam room while observing appropriate contact time as indicated for disinfecting solutions.    Patient ID: Mikayla [place], female  DOB: 1961/03/24, 60 y.o.   MRN: [id number] Patient Care Team    Relationship Specialty Notifications Start End  Ma Hillock, DO PCP - General Family Medicine  08/07/15   Kennon Holter, NP Nurse Practitioner Nurse Practitioner  02/27/16    Comment: Gynecology  Gatha Mayer, MD Consulting Physician Gastroenterology  04/30/17     Chief Complaint  Patient presents with   Annual Exam    Pt is fasting    Subjective: Mikayla [place] is a 60 y.o.  Female  present for CPE. All past medical history, surgical history, allergies, family history, immunizations, medications and social history were updated in the electronic medical record today. All recent labs, ED visits and hospitalizations within the last year were reviewed.  Health maintenance:  Colonoscopy: Completed 07/04/2016, 5 year recall. One colon polyp. Family history unknown (adopted). Completed by Dr. Carlean Purl. Mammogram: Completed 05/2021, [facility]- gyn orderedp Cervical cancer screening: Completed 2019, [facility].  Immunizations: tdap UTD 02/27/2016,flu UTD 2022, Shingrix series  completed , covid series completed Infectious disease screening: HIV and hepatitis C completed Assistive device: none Oxygen OIB:BCWU Patient has a Dental home. Hospitalizations/ED visits: reviewed  Depression screen [facility] 2/9 06/28/2021 02/19/2021 06/27/2020 05/09/2019 06/03/2018  Decreased Interest 0 0 0 0 0  Down, Depressed, Hopeless 0 0 0 0 0  PHQ - 2 Score 0 0 0 0 0   GAD 7 : Generalized Anxiety Score 05/09/2019 06/03/2018  Nervous, Anxious, on Edge 0 0  Control/stop worrying 0 0  Worry too much -  different things 0 0  Trouble relaxing 0 0  Restless 0 0  Easily annoyed or irritable 0 0  Afraid - awful might happen 0 0  Total GAD 7 Score 0 0  Anxiety Difficulty Not difficult at all -   Immunization History  Administered Date(s) Administered   Influenza,inj,Quad PF,6+ Mos 06/13/2014, 11/12/2016, 06/03/2018, 06/29/2019, 06/27/2020, 06/03/2021   PFIZER(Purple Top)SARS-COV-2 Vaccination 12/16/2019, 01/06/2020, 09/07/2020   Tdap 02/27/2016   Zoster Recombinat (Shingrix) 06/03/2018, 08/20/2018   Past Medical History:  Diagnosis Date   Acute appendicitis 05/14/2021   Allergy    BPPV (benign paroxysmal positional vertigo)    Golfer's elbow, left 05/27/2020   Hyperlipidemia    Lipoma of back 06/14/2014   throracic back, R. US shows lipoma. No intervention recommended.    Rotator cuff tendinitis, right 07/22/2018   Squamous cell skin cancer    SCC   Allergies  Allergen Reactions   Hydrocodone-Acetaminophen Hives and Other (See Comments)    Pt states that she does not recall having a reaction to this medication. She may have taken this med after getting her appendix removed.   Past Surgical History:  Procedure Laterality Date   APPENDECTOMY     BACK SURGERY     BREAST SURGERY     biopsy   CHOLECYSTECTOMY     COLONOSCOPY     LAPAROSCOPIC APPENDECTOMY N/A 05/14/2021   Procedure: APPENDECTOMY LAPAROSCOPIC;  Surgeon: Clovis Riley, MD;  Location: [place];  Service: General;  Laterality: N/A;   LIPOMA EXCISION     under breast on R side   Family History  Adopted: Yes  Problem  Relation Age of Onset   Colon cancer Neg Hx    Social History   Social History Narrative   Mikayla [place] lives with her husband and daughter. She is originally from [place]-moved to [place]. She relocated to [facility] about [place] from [place].    Allergies as of 06/28/2021       Reactions   Hydrocodone-acetaminophen Hives, Other (See Comments)   Pt states that she does not recall having a reaction to this  medication. She may have taken this med after getting her appendix removed.        Medication List        Accurate as of June 28, 2021 12:14 PM. If you have any questions, ask your nurse or doctor.          atorvastatin 80 MG tablet Commonly known as: LIPITOR Take 1 tablet (80 mg total) by mouth daily.   b complex vitamins capsule Take 1 capsule by mouth daily.   Diclofenac Sodium CR 100 MG 24 hr tablet Take 1 tablet (100 mg total) by mouth daily.   ezetimibe 10 MG tablet Commonly known as: ZETIA Take 1 tablet (10 mg total) by mouth daily.   fexofenadine 180 MG tablet Commonly known as: ALLEGRA Take 180 mg by mouth daily.   pregabalin 75 MG capsule Commonly known as: Lyrica Take 1 capsule (75 mg total) by mouth 2 (two) times daily.   traZODone 50 MG tablet Commonly known as: DESYREL Take 0.5-1 tablets (25-50 mg total) by mouth at bedtime as needed for sleep.   Vitamin D (Cholecalciferol) 25 MCG (1000 UT) Tabs Take 2,000 Units by mouth.        All past medical history, surgical history, allergies, family history, immunizations andmedications were updated in the EMR today and reviewed under the history and medication portions of their EMR.       MM DIAG BREAST TOMO BILATERAL Result Date: 05/28/2021 IMPRESSION: 1. No mammographic or sonographic evidence of malignancy or other imaging abnormality at the palpable site of concern in the lower-inner right breast. 2.  No mammographic evidence of malignancy in the left breast. RECOMMENDATION: 1. Recommend any further workup of the palpable site in the right breast be on a clinical basis. 2.  Screening mammogram in one year.(Code:SM-B-01Y) I have discussed the findings and recommendations with the patient. If applicable, a reminder letter will be sent to the patient regarding the next appointment. BI-RADS CATEGORY  1: Negative. Electronically Signed   By: Audie Pinto M.D.   On: 05/28/2021 11:05    ROS: 14 pt  review of systems performed and negative (unless mentioned in an HPI)  Objective: BP 118/78   Pulse 77   Temp 98.4 F (36.9 C) (Oral)   Ht 5\' 4"  (1.626 m)   Wt 151 lb (68.5 kg)   SpO2 99%   BMI 25.92 kg/m  Gen: Afebrile. No acute distress. Nontoxic in appearance, well-developed, well-nourished,  very pleasant female.  HENT: AT. [place]. Bilateral TM visualized and normal in appearance, normal external auditory canal. MMM, no oral lesions, adequate dentition. Bilateral nares within normal limits. Throat without erythema, ulcerations or exudates. no Cough on exam, no hoarseness on exam. Eyes:Pupils Equal Round Reactive to light, Extraocular movements intact,  Conjunctiva without redness, discharge or icterus. Neck/lymp/endocrine: Supple,no lymphadenopathy, no thyromegaly CV: RRR no murmur, no edema, +2/4 P posterior tibialis pulses.  Chest: CTAB, no wheeze, rhonchi or crackles. normal Respiratory effort. good Air movement. Abd: Soft. flat. NTND.  BS present. no Masses palpated. No hepatosplenomegaly. No rebound tenderness or guarding. Skin: no rashes, purpura or petechiae. Warm and well-perfused. Skin intact. Neuro/Msk:  Normal gait. PERLA. EOMi. Alert. Oriented x3.  Cranial nerves II through XII intact. Muscle strength 5/5 upper/lower extremity. DTRs equal bilaterally. Psych: Normal affect, dress and demeanor. Normal speech. Normal thought content and judgment.   No results found.  Assessment/plan: Mikayla [place] is a 60 y.o. female present for CPE  History of adenomatous polyp of colon Rpt due 06/2022 Prediabetes - Hemoglobin A1c Vitamin D deficiency - VITAMIN D 25 Hydroxy (Vit-D Deficiency, Fractures) Overweight (BMI 25.0-29.9) Has been doing great with weight loss.  Macrocytosis without anemia - CBC with Differential/Platelet Mixed hyperlipidemia/statin therapy/aortic atherosclerosis -Continue lipitor 80 qhs - Comprehensive metabolic panel - Lipid panel - TSH Abnormal blood level  of iron - Iron, TIBC and Ferritin Panel B12 deficiency - B12 and Folate Panel  Encounter for preventive health examination Colonoscopy: Completed 07/04/2016, 5 year recall. One colon polyp. Family history unknown (adopted). Completed by Dr. Carlean Purl. Mammogram: Completed 05/2021, [facility] gyn orderedp Cervical cancer screening: Completed 2019, [facility].  Immunizations: tdap UTD 02/27/2016,flu UTD 2022, Shingrix series  completed , covid series completed Infectious disease screening: HIV and hepatitis C completed Patient was encouraged to exercise greater than 150 minutes a week. Patient was encouraged to choose a diet filled with fresh fruits and vegetables, and lean meats. AVS provided to patient today for education/recommendation on gender specific health and safety maintenance.  Return in about 5 months (around 12/05/2021) for [place] (30 min). Only if still needing refills. She may  not  need diclofenac, lyrica or trazodone any longer after/if having back surgery. If not needing refills her conditions can be followed yearly with cpe (unless labs indicate need to bring back sooner).   Orders Placed This Encounter  Procedures   CBC with Differential/Platelet   Hemoglobin A1c   Comprehensive metabolic panel   Lipid panel   TSH   VITAMIN D 25 Hydroxy (Vit-D Deficiency, Fractures)   Iron, TIBC and Ferritin Panel   B12 and Folate Panel   No orders of the defined types were placed in this encounter.  Referral Orders  No referral(s) requested today     Electronically signed by: Howard Pouch, [place]

## 2021-06-28 NOTE — Patient Instructions (Signed)
Great to see you today.  I have refilled the medication(s) we provide.   If labs were collected, we will inform you of lab results once received either by echart message or telephone call.   - echart message- for normal results that have been seen by the patient already.   - telephone call: abnormal results or if patient has not viewed results in their echart. Health Maintenance, Female Adopting a healthy lifestyle and getting preventive care are important in promoting health and wellness. Ask your health care provider about: The right schedule for you to have regular tests and exams. Things you can do on your own to prevent diseases and keep yourself healthy. What should I know about diet, weight, and exercise? Eat a healthy diet  Eat a diet that includes plenty of vegetables, fruits, low-fat dairy products, and lean protein. Do not eat a lot of foods that are high in solid fats, added sugars, or sodium. Maintain a healthy weight Body mass index (BMI) is used to identify weight problems. It estimates body fat based on height and weight. Your health care provider can help determine your BMI and help you achieve or maintain a healthy weight. Get regular exercise Get regular exercise. This is one of the most important things you can do for your health. Most adults should: Exercise for at least 150 minutes each week. The exercise should increase your heart rate and make you sweat (moderate-intensity exercise). Do strengthening exercises at least twice a week. This is in addition to the moderate-intensity exercise. Spend less time sitting. Even light physical activity can be beneficial. Watch cholesterol and blood lipids Have your blood tested for lipids and cholesterol at 59 years of age, then have this test every 5 years. Have your cholesterol levels checked more often if: Your lipid or cholesterol levels are high. You are older than 59 years of age. You are at high risk for heart  disease. What should I know about cancer screening? Depending on your health history and family history, you may need to have cancer screening at various ages. This may include screening for: Breast cancer. Cervical cancer. Colorectal cancer. Skin cancer. Lung cancer. What should I know about heart disease, diabetes, and high blood pressure? Blood pressure and heart disease High blood pressure causes heart disease and increases the risk of stroke. This is more likely to develop in people who have high blood pressure readings, are of African descent, or are overweight. Have your blood pressure checked: Every 3-5 years if you are 18-39 years of age. Every year if you are 40 years old or older. Diabetes Have regular diabetes screenings. This checks your fasting blood sugar level. Have the screening done: Once every three years after age 40 if you are at a normal weight and have a low risk for diabetes. More often and at a younger age if you are overweight or have a high risk for diabetes. What should I know about preventing infection? Hepatitis B If you have a higher risk for hepatitis B, you should be screened for this virus. Talk with your health care provider to find out if you are at risk for hepatitis B infection. Hepatitis C Testing is recommended for: Everyone born from 1945 through 1965. Anyone with known risk factors for hepatitis C. Sexually transmitted infections (STIs) Get screened for STIs, including gonorrhea and chlamydia, if: You are sexually active and are younger than 59 years of age. You are older than 59 years of age and your   health care provider tells you that you are at risk for this type of infection. Your sexual activity has changed since you were last screened, and you are at increased risk for chlamydia or gonorrhea. Ask your health care provider if you are at risk. Ask your health care provider about whether you are at high risk for HIV. Your health care provider  may recommend a prescription medicine to help prevent HIV infection. If you choose to take medicine to prevent HIV, you should first get tested for HIV. You should then be tested every 3 months for as long as you are taking the medicine. Pregnancy If you are about to stop having your period (premenopausal) and you may become pregnant, seek counseling before you get pregnant. Take 400 to 800 micrograms (mcg) of folic acid every day if you become pregnant. Ask for birth control (contraception) if you want to prevent pregnancy. Osteoporosis and menopause Osteoporosis is a disease in which the bones lose minerals and strength with aging. This can result in bone fractures. If you are 65 years old or older, or if you are at risk for osteoporosis and fractures, ask your health care provider if you should: Be screened for bone loss. Take a calcium or vitamin D supplement to lower your risk of fractures. Be given hormone replacement therapy (HRT) to treat symptoms of menopause. Follow these instructions at home: Lifestyle Do not use any products that contain nicotine or tobacco, such as cigarettes, e-cigarettes, and chewing tobacco. If you need help quitting, ask your health care provider. Do not use street drugs. Do not share needles. Ask your health care provider for help if you need support or information about quitting drugs. Alcohol use Do not drink alcohol if: Your health care provider tells you not to drink. You are pregnant, may be pregnant, or are planning to become pregnant. If you drink alcohol: Limit how much you use to 0-1 drink a day. Limit intake if you are breastfeeding. Be aware of how much alcohol is in your drink. In the [place] one drink equals one 12 oz bottle of beer (355 mL), one 5 oz glass of wine (148 mL), or one 1 oz glass of hard liquor (44 mL). General instructions Schedule regular health, dental, and eye exams. Stay current with your vaccines. Tell your health care  provider if: You often feel depressed. You have ever been abused or do not feel safe at home. Summary Adopting a healthy lifestyle and getting preventive care are important in promoting health and wellness. Follow your health care provider's instructions about healthy diet, exercising, and getting tested or screened for diseases. Follow your health care provider's instructions on monitoring your cholesterol and blood pressure. This information is not intended to replace advice given to you by your health care provider. Make sure you discuss any questions you have with your health care provider. Document Revised: 11/30/2020 Document Reviewed: 09/15/2018 Elsevier Patient Education  2022 Elsevier Inc.  

## 2021-06-29 LAB — IRON,TIBC AND FERRITIN PANEL
%SAT: 29 % (calc) (ref 16–45)
Ferritin: 218 ng/mL (ref 16–232)
Iron: 98 ug/dL (ref 45–160)
TIBC: 334 mcg/dL (calc) (ref 250–450)

## 2021-07-01 ENCOUNTER — Telehealth: Payer: Self-pay

## 2021-07-01 NOTE — Telephone Encounter (Signed)
Retrieved voicemail from patient.  Returning Mikayla Tucker's call.  [phone]

## 2021-07-01 NOTE — Telephone Encounter (Signed)
Spoke with patient regarding results/recommendations,voiced understanding.  

## 2021-08-16 ENCOUNTER — Encounter: Payer: Self-pay | Admitting: Internal Medicine

## 2021-10-24 ENCOUNTER — Encounter: Payer: Self-pay | Admitting: Internal Medicine

## 2021-11-27 ENCOUNTER — Other Ambulatory Visit: Payer: Self-pay | Admitting: Family Medicine

## 2021-12-04 ENCOUNTER — Other Ambulatory Visit: Payer: Self-pay

## 2021-12-05 ENCOUNTER — Encounter: Payer: Self-pay | Admitting: Family Medicine

## 2021-12-05 ENCOUNTER — Ambulatory Visit (INDEPENDENT_AMBULATORY_CARE_PROVIDER_SITE_OTHER): Payer: Commercial Managed Care - PPO | Admitting: Family Medicine

## 2021-12-05 VITALS — BP 110/74 | HR 91 | Temp 98.3°F | Ht 64.0 in | Wt 157.0 lb

## 2021-12-05 DIAGNOSIS — D171 Benign lipomatous neoplasm of skin and subcutaneous tissue of trunk: Secondary | ICD-10-CM

## 2021-12-05 DIAGNOSIS — D7589 Other specified diseases of blood and blood-forming organs: Secondary | ICD-10-CM

## 2021-12-05 DIAGNOSIS — Z981 Arthrodesis status: Secondary | ICD-10-CM

## 2021-12-05 DIAGNOSIS — E559 Vitamin D deficiency, unspecified: Secondary | ICD-10-CM

## 2021-12-05 DIAGNOSIS — L03012 Cellulitis of left finger: Secondary | ICD-10-CM

## 2021-12-05 DIAGNOSIS — R7303 Prediabetes: Secondary | ICD-10-CM

## 2021-12-05 DIAGNOSIS — I700 Atherosclerosis of aorta: Secondary | ICD-10-CM

## 2021-12-05 DIAGNOSIS — E663 Overweight: Secondary | ICD-10-CM

## 2021-12-05 DIAGNOSIS — E782 Mixed hyperlipidemia: Secondary | ICD-10-CM

## 2021-12-05 MED ORDER — EZETIMIBE 10 MG PO TABS: 10.0000 mg | ORAL_TABLET | Freq: Every day | ORAL | 3 refills | Status: DC

## 2021-12-05 MED ORDER — ATORVASTATIN CALCIUM 80 MG PO TABS: 80.0000 mg | ORAL_TABLET | Freq: Every day | ORAL | 3 refills | Status: DC

## 2021-12-05 MED ORDER — MUPIROCIN 2 % EX OINT: 1.0000 "application " | TOPICAL_OINTMENT | Freq: Three times a day (TID) | CUTANEOUS | 1 refills | Status: DC

## 2021-12-05 MED ORDER — TRAZODONE HCL 50 MG PO TABS
25.0000 mg | ORAL_TABLET | Freq: Every evening | ORAL | 1 refills | Status: DC | PRN
Start: 2021-12-05 — End: 2022-12-16

## 2021-12-05 NOTE — Patient Instructions (Addendum)
? ? ?Paronychia ?Paronychia is an infection of the skin. It happens near a fingernail or toenail. It may cause pain and swelling around the nail. In some cases, a fluid-filled bump (abscess) can form near or under the nail. ?Often, this condition is not serious, and it clears up with treatment. ?What are the causes? ?This condition may be caused by a germ. The germ may be bacteria or a fungus. These germs can enter the body through an opening in the skin, such as a cut or a hangnail. Other causes include: ?Repeated injuries to your fingernails or toenails. ?Irritation of the base and sides of the nail (cuticle). ?What increases the risk? ?This condition is more likely to develop in people who: ?Get their hands wet often, such as a dishwasher. ?Bite their fingernails or the base and sides of their nails. ?Have other skin problems. ?Have hangnails or hurt fingertips. ?Come into contact with chemicals like detergents. ?Have diabetes. ?What are the signs or symptoms? ?Redness and swelling of the skin near the nail. ?A tender feeling around the nail. ?Pus-filled bumps under the skin at the base and sides of the nail. ?Fluid or pus under the nail. ?Pain in the area. ?How is this treated? ?Treatment depends on the cause of your condition and how bad it is. If your condition is mild, it may clear up on its own in a few days or after soaking in warm water. If needed, treatment may include: ?Antibiotic medicine. ?Antifungal medicine. ?A procedure to drain pus from a fluid-filled bump. ?Medicine to treat irritation and swelling (corticosteroids). ?Taking off part of an ingrown toenail. ?A bandage (dressing) may be placed over the nail area. ?Follow these instructions at home: ?Wound care ?Keep the affected area clean. ?Soak the fingers or toes in warm water as told by your doctor. You may be told to do this for 20 minutes, 2-3 times a day. ?Keep the area dry when you are not soaking it. ?Do not try to drain a fluid-filled  bump on your own. ?Follow instructions from your doctor about how to take care of the affected area. Make sure you: ?Wash your hands with soap and water for at least 20 seconds before and after you change your bandage. If you cannot use soap and water, use hand sanitizer. ?Change your bandage as told by your doctor. ?If you had a fluid-filled bump and your doctor drained it, check the area every day for signs of infection. Check for: ?Redness, swelling, or pain. ?Fluid or blood. ?Warmth. ?Pus or a bad smell. ?Medicines ? ?Take over-the-counter and prescription medicines only as told by your doctor. ?If you were prescribed an antibiotic medicine, take it as told by your doctor. Do not stop taking it even if you start to feel better. ?General instructions ?Avoid contact with anything that irritates your skin or that you are allergic to. ?Do not pick at the affected area. ?Keep all follow-up visits. ?Prevention ?To prevent this condition from happening again: ?Wear rubber gloves when putting your hands in water for washing dishes or other tasks. ?Wear gloves if your hands might touch cleaners or chemicals. ?Avoid injuring your nails or fingertips. ?Do not bite your nails or tear hangnails. ?Do not cut your nails very short. ?Do not cut the skin at the base and sides of the nail. ?Use clean nail clippers or scissors when trimming nails. ?Contact a doctor if: ?You feel worse. ?You do not get better. ?You keep having or you have more fluid,  blood, or pus coming from the affected area. ?Your affected finger, toe, or joint gets swollen or hard to move. ?You have a fever or chills. ?There is redness spreading from the affected area. ?Summary ?Paronychia is an infection of the skin. It happens near a fingernail or toenail. ?This condition may cause pain and swelling around the nail. ?Soak the fingers or toes in warm water as told by your doctor. ?Often, this condition is not serious, and it clears up with treatment. ?This  information is not intended to replace advice given to you by your health care provider. Make sure you discuss any questions you have with your health care provider. ?Document Revised: 12/24/2020 Document Reviewed: 12/24/2020 ?Elsevier Patient Education ? [employer]. ? ?

## 2021-12-05 NOTE — Progress Notes (Signed)
This visit occurred during the SARS-CoV-2 public health emergency.  Safety protocols were in place, including screening questions prior to the visit, additional usage of staff PPE, and extensive cleaning of exam room while observing appropriate contact time as indicated for disinfecting solutions.    Patient ID: Mikayla Tucker, female  DOB: [birth year], 61 y.o.   MRN: [id number] Patient Care Team    Relationship Specialty Notifications Start End  Ma Hillock, DO PCP - General Family Medicine  08/07/15   Kennon Holter, NP Nurse Practitioner Nurse Practitioner  02/27/16    Comment: Gynecology  Gatha Mayer, MD Consulting Physician Gastroenterology  04/30/17     Chief Complaint  Patient presents with   Hypertension    C,c; pt is fasting    Subjective:  Mikayla Tucker is a 61 y.o.  Female  present for [facility] Lipoma: Patient reports she has a significantly large lipoma on her right upper back.  She feels it is growing in size and it is uncomfortable.  She would like to be referred to a surgeon to discuss removal. Left finger pain: Patient reports the tip of her left ring finger has been painful.  She denies any swelling or drainage.  She states it is mildly red.  She does have false nails. Sleep disturbance: She reports she has not been sleeping well recently.-Because she is taking the gabapentin.  She stopped the trazodone for now, but would like it refilled if she needs it when she stops the gabapentin which she plans to do shortly.    Mixed hyperlipidemia/Overweight (BMI 25.0-29.9) Patient reports compliance with atorvastatin 80 mg daily and Zetia 10 mg daily.  Macrocytosis without anemia Has been stable without decrease in other cell lines.  She had follow-up with hematology, which felt that she could continue to follow-up every 6 months with PCP with CBC.  She reports she has not been drinking much alcohol.  Vitamin D deficiency She is supplementing.  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  She is no longer taking the Voltaren or the Lyrica.  She has tapered down on the gabapentin and is hoping to getting off of this medication as well.  Depression screen [facility] 2/9 12/05/2021 06/28/2021 02/19/2021 06/27/2020 05/09/2019  Decreased Interest 0 0 0 0 0  Down, Depressed, Hopeless 0 0 0 0 0  PHQ - 2 Score 0 0 0 0 0   GAD 7 : Generalized Anxiety Score 05/09/2019 06/03/2018  Nervous, Anxious, on Edge 0 0  Control/stop worrying 0 0  Worry too much - different things 0 0  Trouble relaxing 0 0  Restless 0 0  Easily annoyed or irritable 0 0  Afraid - awful might happen 0 0  Total GAD 7 Score 0 0  Anxiety Difficulty Not difficult at all -    Immunization History  Administered Date(s) Administered   Influenza Split 07/28/2017   Influenza,inj,Quad PF,6+ Mos 06/13/2014, 11/12/2016, 06/03/2018, 06/29/2019, 06/27/2020, 06/03/2021   PFIZER(Purple Top)SARS-COV-2 Vaccination 12/16/2019, 01/06/2020, 09/07/2020   Tdap 02/27/2016   Zoster Recombinat (Shingrix) 06/03/2018, 08/20/2018     Past Medical History:  Diagnosis Date   Acute appendicitis 05/14/2021   Allergy    BPPV (benign paroxysmal positional vertigo)    Golfer's elbow, left 05/27/2020   Hx of adenomatous polyp of colon 2017   Hyperlipidemia  Lipoma of back 06/14/2014   throracic back, R. US shows lipoma. No intervention recommended.    Rotator cuff tendinitis, right 07/22/2018   Squamous cell skin cancer    SCC   Allergies  Allergen Reactions   Hydrocodone-Acetaminophen Hives and Other (See Comments)    Pt states that she does not recall having a reaction to this medication. She may have taken this med after getting her appendix removed.   Past Surgical History:  Procedure Laterality Date   APPENDECTOMY     BACK  SURGERY     BREAST SURGERY     biopsy   CHOLECYSTECTOMY     COLONOSCOPY     LAPAROSCOPIC APPENDECTOMY N/A 05/14/2021   Procedure: APPENDECTOMY LAPAROSCOPIC;  Surgeon: Clovis Riley, MD;  Location: [facility];  Service: General;  Laterality: N/A;   LIPOMA EXCISION     under breast on R side   MICRODISCECTOMY LUMBAR     Family History  Adopted: Yes  Problem Relation Age of Onset   Colon cancer Neg Hx    Social History   Social History Narrative   Ms. Mikayla Tucker lives with her husband and daughter. She is originally from [place]-moved to [place]. She relocated to [facility] about [place] from [place].    Allergies as of 12/05/2021       Reactions   Hydrocodone-acetaminophen Hives, Other (See Comments)   Pt states that she does not recall having a reaction to this medication. She may have taken this med after getting her appendix removed.        Medication List        Accurate as of December 05, 2021 12:06 PM. If you have any questions, ask your nurse or doctor.          STOP taking these medications    Diclofenac Sodium CR 100 MG 24 hr tablet Stopped by: Howard Pouch, DO   pregabalin 75 MG capsule Commonly known as: Lyrica Stopped by: Howard Pouch, DO       TAKE these medications    atorvastatin 80 MG tablet Commonly known as: LIPITOR Take 1 tablet (80 mg total) by mouth daily.   b complex vitamins capsule Take 1 capsule by mouth daily.   ezetimibe 10 MG tablet Commonly known as: ZETIA Take 1 tablet (10 mg total) by mouth daily.   fexofenadine 180 MG tablet Commonly known as: ALLEGRA Take 180 mg by mouth daily.   gabapentin 300 MG capsule Commonly known as: NEURONTIN Take by mouth.   meloxicam 7.5 MG tablet Commonly known as: MOBIC Take 7.5 mg by mouth 2 (two) times daily as needed.   mupirocin ointment 2 % Commonly known as: BACTROBAN Apply 1 application topically 3 (three) times daily. Cream or ointment- whatever on formulary si ok. Started by: Howard Pouch, DO   traZODone 50 MG tablet Commonly known as: DESYREL Take 0.5-1 tablets (25-50 mg total) by mouth at bedtime as needed for sleep.   Vitamin D (Cholecalciferol) 25 MCG (1000 UT) Tabs Take 2,000 Units by mouth.        All past medical history, surgical history, allergies, family history, immunizations andmedications were updated in the EMR today and reviewed under the history and medication portions of their EMR.       ROS: 14 pt review of systems performed and negative (unless mentioned in an HPI)  Objective: BP 110/74    Pulse 91    Temp 98.3 F (36.8 C) (Oral)  Ht 5\' 4"  (1.626 m)    Wt 157 lb (71.2 kg)    SpO2 97%    BMI 26.95 kg/m  Physical Exam Vitals and nursing note reviewed.  Constitutional:      General: She is not in acute distress.    Appearance: Normal appearance. She is not ill-appearing, toxic-appearing or diaphoretic.  HENT:     Head: Normocephalic and atraumatic.  Eyes:     General: No scleral icterus.       Right eye: No discharge.        Left eye: No discharge.     Extraocular Movements: Extraocular movements intact.     Conjunctiva/sclera: Conjunctivae normal.     Pupils: Pupils are equal, round, and reactive to light.  Cardiovascular:     Rate and Rhythm: Normal rate and regular rhythm.     Heart sounds: No murmur heard.   No friction rub. No gallop.  Pulmonary:     Effort: Pulmonary effort is normal. No respiratory distress.     Breath sounds: Normal breath sounds. No wheezing, rhonchi or rales.  Musculoskeletal:        General: Deformity (Large lipoma 9 cm x 6 cm right thoracic above bra line.) present.     Cervical back: Neck supple. No tenderness.  Lymphadenopathy:     Cervical: No cervical adenopathy.  Skin:    General: Skin is warm and dry.     Coloration: Skin is not jaundiced or pale.     Findings: Erythema (Left finger with mild redness and tenderness medial nail fold.  No drainage.  No abscess noted.) present. No rash.   Neurological:     Mental Status: She is alert and oriented to person, place, and time. Mental status is at baseline.     Motor: No weakness.     Gait: Gait normal.  Psychiatric:        Mood and Affect: Mood normal.        Behavior: Behavior normal.        Thought Content: Thought content normal.        Judgment: Judgment normal.     No results found.  Assessment/plan: ZANOBIA GRIEBEL is a 61 y.o. female present for Mixed hyperlipidemia/overweight Stable.   Continue Zetia and Lipitor 80 mg daily  - follow yearly-fasting labs due next visit at [facility]  Macrocytosis without anemia Has seen heme and only needs to follow again if other cell lines are abnormal.  Cbc q 6 mos follow up recommend by provider appt. If macrocytosis is present.  Labs due next visit Vitamin D deficiency/hypocalcemia - supplementing 2000u qd -labs due next visit vit d and calcium  Sleep disturbance: Stable.  Continue trazodone 25 mg to 75 mg taper as needed.  She May restart after tapering off gabapentin for her back.  Prediabetes Has been stable.  Last A1c down to 5.2.  Labs due next visit  Lipoma of torso Rather large lipoma of her upper back.  It is starting to become uncomfortable as it grows.  She would like to consider surgical removal.  Will refer to [facility].  Infection of nail bed of finger of left hand Very mild infection of nail fold.  There is some erythema present without drainage or abscess.  Discussed soaking in warm water and Epsom salt soaks 15 minutes at a time daily until she starts seeing improvement.  Encouraged her to have her false nail removed until completely healed. After soaking placed Bactroban cream twice  daily If worsening, she is to be seen immediately to see if antibiotics are warranted at that time.  Patient reports understanding   Orders Placed This Encounter  Procedures   Ambulatory referral to General Surgery   Meds ordered this encounter  Medications    atorvastatin (LIPITOR) 80 MG tablet    Sig: Take 1 tablet (80 mg total) by mouth daily.    Dispense:  90 tablet    Refill:  3   ezetimibe (ZETIA) 10 MG tablet    Sig: Take 1 tablet (10 mg total) by mouth daily.    Dispense:  90 tablet    Refill:  3   traZODone (DESYREL) 50 MG tablet    Sig: Take 0.5-1 tablets (25-50 mg total) by mouth at bedtime as needed for sleep.    Dispense:  90 tablet    Refill:  1    Hold until pt request please.   mupirocin ointment (BACTROBAN) 2 %    Sig: Apply 1 application topically 3 (three) times daily. Cream or ointment- whatever on formulary si ok.    Dispense:  15 g    Refill:  1    Referral Orders         Ambulatory referral to General Surgery       Electronically signed by: Howard Pouch, DO [place]

## 2021-12-13 ENCOUNTER — Telehealth: Payer: Self-pay | Admitting: Family Medicine

## 2021-12-13 NOTE — Telephone Encounter (Signed)
Pt called about update on referral. ?Informed pt that referal was put in on 12/05/2021 to [facility] surgery. ? ?Informed pt it takes up to 2 weeks for referrals. ?Told pt someone will contact her to schedule an appt.  ? ?Pt cell: [phone] ?

## 2021-12-17 NOTE — Telephone Encounter (Signed)
Pt calling again about referral, told pt it takes roughly up to 2 weeks. ? ?Told pt someone will contact her from the surgical location.  ?

## 2021-12-19 NOTE — Telephone Encounter (Signed)
Revd call from [facility] this morning, They will be calling her today to schedule  ?

## 2021-12-19 NOTE — Telephone Encounter (Signed)
Recvd a call back from [place] at [facility]. She states she has tried to make 2 calls to the patient today. There is no VM set up an the phone just rings. If she calls back please have her call them directly to schedule [phone] ?

## 2022-01-13 NOTE — Telephone Encounter (Signed)
Done.. fax resent ?

## 2022-01-13 NOTE — Telephone Encounter (Signed)
Received call from patient regarding status of referral to [facility]. Patient stated its been over a month and she has not received any calls or messages from their office. ?I gave Ori [facility] phone number, she will follow up with them. ? ? ?Kamren called back before I could send 1st note (above) [facility] had the incorrect phone number for patient. They have updated their file with correct phone number.  ? ?Lattie Haw at [facility] requested our office to send referral and notes. ? ?Please fax to Attention: Lattie Haw ?[phone] ? ? ?  ?

## 2022-01-30 ENCOUNTER — Telehealth: Payer: Self-pay

## 2022-01-30 NOTE — Telephone Encounter (Signed)
Pt sched for f/u appt ?

## 2022-01-30 NOTE — Telephone Encounter (Signed)
Patient was seen for finger infection.  Patient states it is not any better.  What are next steps? ?Referral? ? ?Patient can be reached at [phone] ?

## 2022-01-31 ENCOUNTER — Encounter: Payer: Self-pay | Admitting: Family Medicine

## 2022-01-31 ENCOUNTER — Ambulatory Visit (INDEPENDENT_AMBULATORY_CARE_PROVIDER_SITE_OTHER): Payer: Commercial Managed Care - PPO | Admitting: Family Medicine

## 2022-01-31 VITALS — BP 114/71 | HR 81 | Temp 98.5°F | Ht 64.0 in | Wt 156.0 lb

## 2022-01-31 DIAGNOSIS — M79646 Pain in unspecified finger(s): Secondary | ICD-10-CM

## 2022-01-31 MED ORDER — AMOXICILLIN-POT CLAVULANATE 875-125 MG PO TABS: 1.0000 | ORAL_TABLET | Freq: Two times a day (BID) | ORAL | 0 refills | Status: DC

## 2022-01-31 MED ORDER — TRIAMCINOLONE ACETONIDE 0.1 % EX CREA: 1.0000 "application " | TOPICAL_CREAM | Freq: Two times a day (BID) | CUTANEOUS | 0 refills | Status: DC

## 2022-01-31 NOTE — Patient Instructions (Signed)
I do not believe this is a paronychia at this point, but more of an injury. ? ?Paronychia ?Paronychia is an infection of the skin. It happens near a fingernail or toenail. It may cause pain and swelling around the nail. In some cases, a fluid-filled bump (abscess) can form near or under the nail. ?Often, this condition is not serious, and it clears up with treatment. ?What are the causes? ?This condition may be caused by a germ. The germ may be bacteria or a fungus. These germs can enter the body through an opening in the skin, such as a cut or a hangnail. Other causes include: ?Repeated injuries to your fingernails or toenails. ?Irritation of the base and sides of the nail (cuticle). ?What increases the risk? ?This condition is more likely to develop in people who: ?Get their hands wet often, such as a dishwasher. ?Bite their fingernails or the base and sides of their nails. ?Have other skin problems. ?Have hangnails or hurt fingertips. ?Come into contact with chemicals like detergents. ?Have diabetes. ?What are the signs or symptoms? ?Redness and swelling of the skin near the nail. ?A tender feeling around the nail. ?Pus-filled bumps under the skin at the base and sides of the nail. ?Fluid or pus under the nail. ?Pain in the area. ?How is this treated? ?Treatment depends on the cause of your condition and how bad it is. If your condition is mild, it may clear up on its own in a few days or after soaking in warm water. If needed, treatment may include: ?Antibiotic medicine. ?Antifungal medicine. ?A procedure to drain pus from a fluid-filled bump. ?Medicine to treat irritation and swelling (corticosteroids). ?Taking off part of an ingrown toenail. ?A bandage (dressing) may be placed over the nail area. ?Follow these instructions at home: ?Wound care ?Keep the affected area clean. ?Soak the fingers or toes in warm water as told by your doctor. You may be told to do this for 20 minutes, 2-3 times a day. ?Keep the area  dry when you are not soaking it. ?Do not try to drain a fluid-filled bump on your own. ?Follow instructions from your doctor about how to take care of the affected area. Make sure you: ?Wash your hands with soap and water for at least 20 seconds before and after you change your bandage. If you cannot use soap and water, use hand sanitizer. ?Change your bandage as told by your doctor. ?If you had a fluid-filled bump and your doctor drained it, check the area every day for signs of infection. Check for: ?Redness, swelling, or pain. ?Fluid or blood. ?Warmth. ?Pus or a bad smell. ?Medicines ? ?Take over-the-counter and prescription medicines only as told by your doctor. ?If you were prescribed an antibiotic medicine, take it as told by your doctor. Do not stop taking it even if you start to feel better. ?General instructions ?Avoid contact with anything that irritates your skin or that you are allergic to. ?Do not pick at the affected area. ?Keep all follow-up visits. ?Prevention ?To prevent this condition from happening again: ?Wear rubber gloves when putting your hands in water for washing dishes or other tasks. ?Wear gloves if your hands might touch cleaners or chemicals. ?Avoid injuring your nails or fingertips. ?Do not bite your nails or tear hangnails. ?Do not cut your nails very short. ?Do not cut the skin at the base and sides of the nail. ?Use clean nail clippers or scissors when trimming nails. ?Contact a doctor if: ?You  feel worse. ?You do not get better. ?You keep having or you have more fluid, blood, or pus coming from the affected area. ?Your affected finger, toe, or joint gets swollen or hard to move. ?You have a fever or chills. ?There is redness spreading from the affected area. ?Summary ?Paronychia is an infection of the skin. It happens near a fingernail or toenail. ?This condition may cause pain and swelling around the nail. ?Soak the fingers or toes in warm water as told by your doctor. ?Often, this  condition is not serious, and it clears up with treatment. ?This information is not intended to replace advice given to you by your health care provider. Make sure you discuss any questions you have with your health care provider. ?Document Revised: 12/24/2020 Document Reviewed: 12/24/2020 ?Elsevier Patient Education ? [place]. ? ?

## 2022-01-31 NOTE — Progress Notes (Signed)
? ? ? ? ? ?Mikayla Tucker , 1960/11/16, 61 y.o., female ?MRN: [id number] ?Patient Care Team  ?  Relationship Specialty Notifications Start End  ?Mikayla Hillock, DO PCP - General Family Medicine  08/07/15   ?Mikayla Holter, NP Nurse Practitioner Nurse Practitioner  02/27/16   ? Comment: Gynecology  ?Mikayla Mayer, MD Consulting Physician Gastroenterology  04/30/17   ? ? ?Chief Complaint  ?Patient presents with  ? Finger Injury  ?  F/u  ? ?  ?Subjective: Pt presents for an OV with complaints of finger discomfort. She was seen about 8 weeks ago for this concern and she feels it is better, but still present. She denies drainage or abscess. She has been soaking in epson salt and applying Bactroban ointment. She has had the acrylic nail removed.  ? ? ?  12/05/2021  ? 10:48 AM 06/28/2021  ? 10:28 AM 02/19/2021  ?  2:12 PM 06/27/2020  ? 10:11 AM 05/09/2019  ?  1:37 PM  ?Depression screen PHQ 2/9  ?Decreased Interest 0 0 0 0 0  ?Down, Depressed, Hopeless 0 0 0 0 0  ?PHQ - 2 Score 0 0 0 0 0  ? ? ?Allergies  ?Allergen Reactions  ? Hydrocodone-Acetaminophen Hives and Other (See Comments)  ?  Pt states that she does not recall having a reaction to this medication. She may have taken this med after getting her appendix removed.  ? ?Social History  ? ?Social History Narrative  ? Ms. Mikayla Tucker lives with her husband and daughter. She is originally from [place]-moved to [place]. She relocated to [facility] about [place] from [place].  ? ?Past Medical History:  ?Diagnosis Date  ? Acute appendicitis 05/14/2021  ? Allergy   ? BPPV (benign paroxysmal positional vertigo)   ? Golfer's elbow, left 05/27/2020  ? Hx of adenomatous polyp of colon 2017  ? Hyperlipidemia   ? Lipoma of back 06/14/2014  ? throracic back, R. US shows lipoma. No intervention recommended.   ? Rotator cuff tendinitis, right 07/22/2018  ? Squamous cell skin cancer   ? SCC  ? ?Past Surgical History:  ?Procedure Laterality Date  ? APPENDECTOMY    ? BACK SURGERY    ? BREAST SURGERY     ? biopsy  ? CHOLECYSTECTOMY    ? COLONOSCOPY    ? LAPAROSCOPIC APPENDECTOMY N/A 05/14/2021  ? Procedure: APPENDECTOMY LAPAROSCOPIC;  Surgeon: Clovis Riley, MD;  Location: [facility];  Service: General;  Laterality: N/A;  ? LIPOMA EXCISION    ? under breast on R side  ? MICRODISCECTOMY LUMBAR    ? ?Family History  ?Adopted: Yes  ?Problem Relation Age of Onset  ? Colon cancer Neg Hx   ? ?Allergies as of 01/31/2022   ? ?   Reactions  ? Hydrocodone-acetaminophen Hives, Other (See Comments)  ? Pt states that she does not recall having a reaction to this medication. She may have taken this med after getting her appendix removed.  ? ?  ? ?  ?Medication List  ?  ? ?  ? Accurate as of January 31, 2022 11:06 AM. If you have any questions, ask your nurse or doctor.  ?  ?  ? ?  ? ?STOP taking these medications   ? ?mupirocin ointment 2 % ?Commonly known as: BACTROBAN ?Stopped by: Howard Pouch, DO ?  ? ?  ? ?TAKE these medications   ? ?amoxicillin-clavulanate 875-125 MG tablet ?Commonly known as: AUGMENTIN ?Take 1  tablet by mouth 2 (two) times daily. ?Started by: Howard Pouch, DO ?  ?atorvastatin 80 MG tablet ?Commonly known as: LIPITOR ?Take 1 tablet (80 mg total) by mouth daily. ?  ?b complex vitamins capsule ?Take 1 capsule by mouth daily. ?  ?ezetimibe 10 MG tablet ?Commonly known as: ZETIA ?Take 1 tablet (10 mg total) by mouth daily. ?  ?fexofenadine 180 MG tablet ?Commonly known as: ALLEGRA ?Take 180 mg by mouth daily. ?  ?gabapentin 300 MG capsule ?Commonly known as: NEURONTIN ?Take by mouth. ?  ?meloxicam 7.5 MG tablet ?Commonly known as: MOBIC ?Take 7.5 mg by mouth 2 (two) times daily as needed. ?  ?traZODone 50 MG tablet ?Commonly known as: DESYREL ?Take 0.5-1 tablets (25-50 mg total) by mouth at bedtime as needed for sleep. ?  ?triamcinolone cream 0.1 % ?Commonly known as: KENALOG ?Apply 1 application. topically 2 (two) times daily. ?Started by: Howard Pouch, DO ?  ?Vitamin D (Cholecalciferol) 25 MCG (1000 UT)  Tabs ?Take 2,000 Units by mouth. ?  ? ?  ? ? ?All past medical history, surgical history, allergies, family history, immunizations andmedications were updated in the EMR today and reviewed under the history and medication portions of their EMR.    ? ?ROS ?Negative, with the exception of above mentioned in HPI ? ? ?Objective:  ?BP 114/71   Pulse 81   Temp 98.5 ?F (36.9 ?C) (Oral)   Ht '5\' 4"'$  (1.626 m)   Wt 156 lb (70.8 kg)   SpO2 98%   BMI 26.78 kg/m?  ?Body mass index is 26.78 kg/m?. ? ?Physical Exam ?Vitals and nursing note reviewed.  ?Constitutional:   ?   General: She is not in acute distress. ?   Appearance: Normal appearance. She is normal weight. She is not ill-appearing or toxic-appearing.  ?Eyes:  ?   Extraocular Movements: Extraocular movements intact.  ?   Conjunctiva/sclera: Conjunctivae normal.  ?   Pupils: Pupils are equal, round, and reactive to light.  ?Musculoskeletal:  ?   Comments: Very mild erythema along lateral nailbed. Lateral nail has mild discoloration (> distal portion than proximal). No drainage or abscess. Mild TTP only.   ?Neurological:  ?   Mental Status: She is alert and oriented to person, place, and time. Mental status is at baseline.  ?Psychiatric:     ?   Mood and Affect: Mood normal.     ?   Behavior: Behavior normal.     ?   Thought Content: Thought content normal.     ?   Judgment: Judgment normal.  ? ? ? ?No results found. ?No results found. ?No results found for this or any previous visit (from the past 24 hour(s)). ? ?Assessment/Plan: ?Mikayla Tucker is a 61 y.o. female present for OV for  ?Infection of nail bed of finger of left hand ?Will cover with 5 days of abx and start steroid cream to area. I believe her discomfort is from injury during [place]. ?She understands to monitor for redness or drainage, and be seen if occurs. Otherwise, allowing nail to grow out will take time. ?  ?Reviewed expectations re: course of current medical issues. ?Discussed self-management of  symptoms. ?Outlined signs and symptoms indicating need for more acute intervention. ?Patient verbalized understanding and all questions were answered. ?Patient received an After-Visit Summary. ? ? ? ?No orders of the defined types were placed in this encounter. ? ?Meds ordered this encounter  ?Medications  ? amoxicillin-clavulanate (AUGMENTIN) 875-125 MG tablet  ?  Sig: Take 1 tablet by mouth 2 (two) times daily.  ?  Dispense:  10 tablet  ?  Refill:  0  ? triamcinolone cream (KENALOG) 0.1 %  ?  Sig: Apply 1 application. topically 2 (two) times daily.  ?  Dispense:  30 g  ?  Refill:  0  ? ?Referral Orders  ?No referral(s) requested today  ? ? ? ?Note is dictated utilizing voice recognition software. Although note has been proof read prior to signing, occasional typographical errors still can be missed. If any questions arise, please do not hesitate to call for verification.  ? ?electronically signed by: ? ?Howard Pouch, DO  ?[facility] [place] ? ? ? ?

## 2022-02-03 ENCOUNTER — Ambulatory Visit: Payer: Commercial Managed Care - PPO | Admitting: Family Medicine

## 2022-02-28 ENCOUNTER — Telehealth: Payer: Self-pay

## 2022-02-28 NOTE — Telephone Encounter (Signed)
   Reason for Referral Request:  right index finger  Has patient been seen PCP for this complaint? Yes  No,  please schedule patient for appointment for complaint.  Patient scheduled on:   Yes, please find out following information.  Referral for which specialty:  General surgery  Preferred office/provider:   [facility]

## 2022-02-28 NOTE — Telephone Encounter (Signed)
Referral to hand surgery is not appropriate without first re-evaluating patient.   Surgeons will only see patients that require surgery, and last we seen her she would not have met that criteria.

## 2022-02-28 NOTE — Telephone Encounter (Signed)
Please advise on referral request to General surgery

## 2022-02-28 NOTE — Telephone Encounter (Signed)
Pt would like to Dr. Gerrit Heck General Surgery.

## 2022-03-04 NOTE — Telephone Encounter (Signed)
Pt scheduled for 05/31

## 2022-03-05 ENCOUNTER — Ambulatory Visit (INDEPENDENT_AMBULATORY_CARE_PROVIDER_SITE_OTHER): Payer: Commercial Managed Care - PPO | Admitting: Family Medicine

## 2022-03-05 ENCOUNTER — Encounter: Payer: Self-pay | Admitting: Family Medicine

## 2022-03-05 VITALS — BP 114/77 | HR 78 | Temp 98.0°F | Ht 64.0 in | Wt 156.0 lb

## 2022-03-05 DIAGNOSIS — L600 Ingrowing nail: Secondary | ICD-10-CM | POA: Diagnosis not present

## 2022-03-05 MED ORDER — FLUOCINOLONE ACETONIDE 0.01 % EX CREA: TOPICAL_CREAM | Freq: Two times a day (BID) | CUTANEOUS | 1 refills | Status: DC

## 2022-03-05 NOTE — Progress Notes (Signed)
Mikayla Tucker , 1961/02/09, 61 y.o., female MRN: [id number] Patient Care Team    Relationship Specialty Notifications Start End  Ma Hillock, DO PCP - General Family Medicine  08/07/15   Kennon Holter, NP Nurse Practitioner Nurse Practitioner  02/27/16    Comment: Gynecology  Gatha Mayer, MD Consulting Physician Gastroenterology  04/30/17     Chief Complaint  Patient presents with   Hand Pain    F/u; worsening     Subjective: Pt presents for an OV with complaints of finger pain that is recurrent for her.  She has been treated with antibiotics and steroid, each time symptoms improved but returned.  There is no fever, abscess or drainage.  It is tender. Prior note: complaints of finger discomfort. She was seen about 8 weeks ago for this concern and she feels it is better, but still present. She denies drainage or abscess. She has been soaking in epson salt and applying Bactroban ointment. She has had the acrylic nail removed.      12/05/2021   10:48 AM 06/28/2021   10:28 AM 02/19/2021    2:12 PM 06/27/2020   10:11 AM 05/09/2019    1:37 PM  Depression screen PHQ 2/9  Decreased Interest 0 0 0 0 0  Down, Depressed, Hopeless 0 0 0 0 0  PHQ - 2 Score 0 0 0 0 0    Allergies  Allergen Reactions   Hydrocodone-Acetaminophen Hives and Other (See Comments)    Pt states that she does not recall having a reaction to this medication. She may have taken this med after getting her appendix removed.   Social History   Social History Narrative   Ms. Kuba lives with her husband and daughter. She is originally from [place]-moved to [place]. She relocated to [facility] about [place] from [place].   Past Medical History:  Diagnosis Date   Acute appendicitis 05/14/2021   Allergy    BPPV (benign paroxysmal positional vertigo)    Golfer's elbow, left 05/27/2020   Hx of adenomatous polyp of colon 2017   Hyperlipidemia    Lipoma of back 06/14/2014   throracic back, R. US shows lipoma.  No intervention recommended.    Rotator cuff tendinitis, right 07/22/2018   Squamous cell skin cancer    SCC   Past Surgical History:  Procedure Laterality Date   APPENDECTOMY     BACK SURGERY     BREAST SURGERY     biopsy   CHOLECYSTECTOMY     COLONOSCOPY     LAPAROSCOPIC APPENDECTOMY N/A 05/14/2021   Procedure: APPENDECTOMY LAPAROSCOPIC;  Surgeon: Clovis Riley, MD;  Location: [facility];  Service: General;  Laterality: N/A;   LIPOMA EXCISION     under breast on R side   MICRODISCECTOMY LUMBAR     Family History  Adopted: Yes  Problem Relation Age of Onset   Colon cancer Neg Hx    Allergies as of 03/05/2022       Reactions   Hydrocodone-acetaminophen Hives, Other (See Comments)   Pt states that she does not recall having a reaction to this medication. She may have taken this med after getting her appendix removed.        Medication List        Accurate as of Mar 05, 2022 10:25 AM. If you have any questions, ask your nurse or doctor.          STOP taking these  medications    triamcinolone cream 0.1 % Commonly known as: KENALOG Stopped by: Howard Pouch, DO       TAKE these medications    amoxicillin-clavulanate 875-125 MG tablet Commonly known as: AUGMENTIN Take 1 tablet by mouth 2 (two) times daily.   atorvastatin 80 MG tablet Commonly known as: LIPITOR Take 1 tablet (80 mg total) by mouth daily.   b complex vitamins capsule Take 1 capsule by mouth daily.   ezetimibe 10 MG tablet Commonly known as: ZETIA Take 1 tablet (10 mg total) by mouth daily.   fexofenadine 180 MG tablet Commonly known as: ALLEGRA Take 180 mg by mouth daily.   fluocinolone 0.01 % cream Commonly known as: VANOS Apply topically 2 (two) times daily. BID to finger Started by: Howard Pouch, DO   gabapentin 300 MG capsule Commonly known as: NEURONTIN Take by mouth.   meloxicam 7.5 MG tablet Commonly known as: MOBIC Take 7.5 mg by mouth 2 (two) times daily as needed.    traZODone 50 MG tablet Commonly known as: DESYREL Take 0.5-1 tablets (25-50 mg total) by mouth at bedtime as needed for sleep.   Vitamin D (Cholecalciferol) 25 MCG (1000 UT) Tabs Take 2,000 Units by mouth.        All past medical history, surgical history, allergies, family history, immunizations andmedications were updated in the EMR today and reviewed under the history and medication portions of their EMR.     ROS Negative, with the exception of above mentioned in HPI   Objective:  BP 114/77   Pulse 78   Temp 98 F (36.7 C) (Oral)   Ht '5\' 4"'$  (1.626 m)   Wt 156 lb (70.8 kg)   SpO2 98%   BMI 26.78 kg/m  Body mass index is 26.78 kg/m. Physical Exam Vitals and nursing note reviewed.  Constitutional:      General: She is not in acute distress.    Appearance: Normal appearance. She is normal weight. She is not ill-appearing or toxic-appearing.  Eyes:     Extraocular Movements: Extraocular movements intact.     Conjunctiva/sclera: Conjunctivae normal.     Pupils: Pupils are equal, round, and reactive to light.  Musculoskeletal:     Comments: No erythema.  No drainage lateral nail has mild swelling (> distal portion than proximal). No drainage or abscess.  Mild tenderness to palpation.  Neurological:     Mental Status: She is alert and oriented to person, place, and time. Mental status is at baseline.  Psychiatric:        Mood and Affect: Mood normal.        Behavior: Behavior normal.        Thought Content: Thought content normal.        Judgment: Judgment normal.    No results found. No results found. No results found for this or any previous visit (from the past 24 hour(s)).  Assessment/Plan: Mikayla Tucker is a 61 y.o. female present for OV for  Ingrown nail of index finger Patient had a treated paronychia of her index finger.  It is currently not infected.  There is very mild swelling along the medial nail fold without erythema or drainage. Vanos steroid cream  twice daily 2-4 weeks until symptoms resolved.  Okay to place Band-Aid over but loosely.  I suspect this is possibly part of the reason she has had return of symptoms secondary to pressure from Band-Aid application. If symptoms do not resolve we will be happy to place  referral to hand surgeon for her to consider partial nail removal.   Reviewed expectations re: course of current medical issues. Discussed self-management of symptoms. Outlined signs and symptoms indicating need for more acute intervention. Patient verbalized understanding and all questions were answered. Patient received an After-Visit Summary.    No orders of the defined types were placed in this encounter.  Meds ordered this encounter  Medications   fluocinolone (VANOS) 0.01 % cream    Sig: Apply topically 2 (two) times daily. BID to finger    Dispense:  60 g    Refill:  1   Referral Orders  No referral(s) requested today     Note is dictated utilizing voice recognition software. Although note has been proof read prior to signing, occasional typographical errors still can be missed. If any questions arise, please do not hesitate to call for verification.   electronically signed by:  Howard Pouch, DO  [place]

## 2022-03-05 NOTE — Patient Instructions (Signed)
Paronychia Paronychia is an infection of the skin. It happens near a fingernail or toenail. It may cause pain and swelling around the nail. In some cases, a fluid-filled bump (abscess) can form near or under the nail. Often, this condition is not serious, and it clears up with treatment. What are the causes? This condition may be caused by a germ. The germ may be bacteria or a fungus. These germs can enter the body through an opening in the skin, such as a cut or a hangnail. Other causes include: Repeated injuries to your fingernails or toenails. Irritation of the base and sides of the nail (cuticle). What increases the risk? This condition is more likely to develop in people who: Get their hands wet often, such as a dishwasher. Bite their fingernails or the base and sides of their nails. Have other skin problems. Have hangnails or hurt fingertips. Come into contact with chemicals like detergents. Have diabetes. What are the signs or symptoms? Redness and swelling of the skin near the nail. A tender feeling around the nail. Pus-filled bumps under the skin at the base and sides of the nail. Fluid or pus under the nail. Pain in the area. How is this treated? Treatment depends on the cause of your condition and how bad it is. If your condition is mild, it may clear up on its own in a few days or after soaking in warm water. If needed, treatment may include: Antibiotic medicine. Antifungal medicine. A procedure to drain pus from a fluid-filled bump. Medicine to treat irritation and swelling (corticosteroids). Taking off part of an ingrown toenail. A bandage (dressing) may be placed over the nail area. Follow these instructions at home: Wound care Keep the affected area clean. Soak the fingers or toes in warm water as told by your doctor. You may be told to do this for 20 minutes, 2-3 times a day. Keep the area dry when you are not soaking it. Do not try to drain a fluid-filled bump on  your own. Follow instructions from your doctor about how to take care of the affected area. Make sure you: Wash your hands with soap and water for at least 20 seconds before and after you change your bandage. If you cannot use soap and water, use hand sanitizer. Change your bandage as told by your doctor. If you had a fluid-filled bump and your doctor drained it, check the area every day for signs of infection. Check for: Redness, swelling, or pain. Fluid or blood. Warmth. Pus or a bad smell. Medicines  Take over-the-counter and prescription medicines only as told by your doctor. If you were prescribed an antibiotic medicine, take it as told by your doctor. Do not stop taking it even if you start to feel better. General instructions Avoid contact with anything that irritates your skin or that you are allergic to. Do not pick at the affected area. Keep all follow-up visits. Prevention To prevent this condition from happening again: Wear rubber gloves when putting your hands in water for washing dishes or other tasks. Wear gloves if your hands might touch cleaners or chemicals. Avoid injuring your nails or fingertips. Do not bite your nails or tear hangnails. Do not cut your nails very short. Do not cut the skin at the base and sides of the nail. Use clean nail clippers or scissors when trimming nails. Contact a doctor if: You feel worse. You do not get better. You keep having or you have more fluid, blood, or   pus coming from the affected area. Your affected finger, toe, or joint gets swollen or hard to move. You have a fever or chills. There is redness spreading from the affected area. Summary Paronychia is an infection of the skin. It happens near a fingernail or toenail. This condition may cause pain and swelling around the nail. Soak the fingers or toes in warm water as told by your doctor. Often, this condition is not serious, and it clears up with treatment. This information  is not intended to replace advice given to you by your health care provider. Make sure you discuss any questions you have with your health care provider. Document Revised: 12/24/2020 Document Reviewed: 12/24/2020 Elsevier Patient Education  2023 [employer]  

## 2022-05-09 ENCOUNTER — Other Ambulatory Visit: Payer: Self-pay | Admitting: Family Medicine

## 2022-06-16 ENCOUNTER — Encounter (HOSPITAL_COMMUNITY): Payer: Self-pay | Admitting: Surgery

## 2022-06-16 ENCOUNTER — Other Ambulatory Visit: Payer: Self-pay

## 2022-06-16 NOTE — Progress Notes (Signed)
PCP - Ma Hillock, DO  ERAS Protcol - Clears until 0430  Anesthesia review: N  Patient verbally denies any shortness of breath, fever, cough and chest pain during phone call   -------------  SDW INSTRUCTIONS given:  Your procedure is scheduled on 06/17/22.  Report to [facility] Main Entrance "A" at 0530 A.M., and check in at the Admitting office.  Call this number if you have problems the morning of surgery:  [phone]   Remember:  Do not eat after midnight the night before your surgery  You may drink clear liquids until 0430 the morning of your surgery.   Clear liquids allowed are: Water, Non-Citrus Juices (without pulp), Carbonated Beverages, Clear Tea, Black Coffee Only, and Gatorade    Take these medicines the morning of surgery with A SIP OF WATER  atorvastatin (LIPITOR) ezetimibe (ZETIA) fexofenadine (ALLEGRA) gabapentin (NEURONTIN)    As of today, STOP taking any Aspirin (unless otherwise instructed by your surgeon) Aleve, Naproxen, Ibuprofen, Motrin, Advil, Goody's, BC's, all herbal medications, fish oil, and all vitamins.                      Do not wear jewelry, make up, or nail polish            Do not wear lotions, powders, perfumes/colognes, or deodorant.            Do not shave 48 hours prior to surgery.  Men may shave face and neck.            Do not bring valuables to the hospital.            [facility] is not responsible for any belongings or valuables.  Do NOT Smoke (Tobacco/Vaping) 24 hours prior to your procedure If you use a CPAP at night, you may bring all equipment for your overnight stay.   Contacts, glasses, dentures or bridgework may not be worn into surgery.      For patients admitted to the hospital, discharge time will be determined by your treatment team.   Patients discharged the day of surgery will not be allowed to drive home, and someone needs to stay with them for 24 hours.    Special instructions:   [facility]- Preparing  For Surgery  Before surgery, you can play an important role. Because skin is not sterile, your skin needs to be as free of germs as possible. You can reduce the number of germs on your skin by washing with CHG (chlorahexidine gluconate) Soap before surgery.  CHG is an antiseptic cleaner which kills germs and bonds with the skin to continue killing germs even after washing.    Oral Hygiene is also important to reduce your risk of infection.  Remember - BRUSH YOUR TEETH THE MORNING OF SURGERY WITH YOUR REGULAR TOOTHPASTE  Please do not use if you have an allergy to CHG or antibacterial soaps. If your skin becomes reddened/irritated stop using the CHG.  Do not shave (including legs and underarms) for at least 48 hours prior to first CHG shower. It is OK to shave your face.  Please follow these instructions carefully.   Shower the NIGHT BEFORE SURGERY and the MORNING OF SURGERY with DIAL Soap.   Pat yourself dry with a CLEAN TOWEL.  Wear CLEAN PAJAMAS to bed the night before surgery  Place CLEAN SHEETS on your bed the night of your first shower and DO NOT SLEEP WITH PETS.   Day of Surgery: Please  shower morning of surgery  Wear Clean/Comfortable clothing the morning of surgery Do not apply any deodorants/lotions.   Remember to brush your teeth WITH YOUR REGULAR TOOTHPASTE.   Questions were answered. Patient verbalized understanding of instructions.

## 2022-06-17 ENCOUNTER — Ambulatory Visit: Payer: Self-pay | Admitting: Surgery

## 2022-06-17 ENCOUNTER — Encounter (HOSPITAL_COMMUNITY): Payer: Self-pay | Admitting: Surgery

## 2022-06-17 ENCOUNTER — Other Ambulatory Visit: Payer: Self-pay

## 2022-06-17 ENCOUNTER — Encounter (HOSPITAL_COMMUNITY)
Admission: RE | Disposition: A | Discharge status: Home / Self Care | Payer: Self-pay | Source: Home / Self Care | Attending: Surgery

## 2022-06-17 ENCOUNTER — Ambulatory Visit (HOSPITAL_COMMUNITY)
Admission: RE | Admit: 2022-06-17 | Discharge: 2022-06-17 | Disposition: A | Discharge status: Home / Self Care | Payer: Commercial Managed Care - PPO | Attending: Surgery | Admitting: Surgery

## 2022-06-17 ENCOUNTER — Ambulatory Visit (HOSPITAL_BASED_OUTPATIENT_CLINIC_OR_DEPARTMENT_OTHER): Payer: Commercial Managed Care - PPO | Admitting: Anesthesiology

## 2022-06-17 ENCOUNTER — Ambulatory Visit (HOSPITAL_COMMUNITY): Payer: Commercial Managed Care - PPO | Admitting: Anesthesiology

## 2022-06-17 DIAGNOSIS — I10 Essential (primary) hypertension: Secondary | ICD-10-CM | POA: Diagnosis not present

## 2022-06-17 DIAGNOSIS — M7989 Other specified soft tissue disorders: Secondary | ICD-10-CM

## 2022-06-17 DIAGNOSIS — Z87891 Personal history of nicotine dependence: Secondary | ICD-10-CM | POA: Diagnosis not present

## 2022-06-17 DIAGNOSIS — L821 Other seborrheic keratosis: Secondary | ICD-10-CM | POA: Insufficient documentation

## 2022-06-17 DIAGNOSIS — D171 Benign lipomatous neoplasm of skin and subcutaneous tissue of trunk: Secondary | ICD-10-CM | POA: Diagnosis present

## 2022-06-17 HISTORY — DX: Other specified postprocedural states: Z98.890

## 2022-06-17 HISTORY — PX: MASS EXCISION: SHX2000

## 2022-06-17 HISTORY — DX: COVID-19: U07.1

## 2022-06-17 HISTORY — DX: Other specified postprocedural states: R11.2

## 2022-06-17 LAB — CBC
HCT: 39 % (ref 36.0–46.0)
Hemoglobin: 13.8 g/dL (ref 12.0–15.0)
MCH: 36.4 pg — ABNORMAL HIGH (ref 26.0–34.0)
MCHC: 35.4 g/dL (ref 30.0–36.0)
MCV: 102.9 fL — ABNORMAL HIGH (ref 80.0–100.0)
Platelets: 306 10*3/uL (ref 150–400)
RBC: 3.79 MIL/uL — ABNORMAL LOW (ref 3.87–5.11)
RDW: 12.1 % (ref 11.5–15.5)
WBC: 6.8 10*3/uL (ref 4.0–10.5)
nRBC: 0 % (ref 0.0–0.2)

## 2022-06-17 LAB — BASIC METABOLIC PANEL
Anion gap: 9 (ref 5–15)
BUN: 9 mg/dL (ref 6–20)
CO2: 23 mmol/L (ref 22–32)
Calcium: 9.4 mg/dL (ref 8.9–10.3)
Chloride: 108 mmol/L (ref 98–111)
Creatinine, Ser: 0.54 mg/dL (ref 0.44–1.00)
GFR, Estimated: >60 mL/min (ref >60)
Glucose, Bld: 107 mg/dL — ABNORMAL HIGH (ref 70–99)
Potassium: 3.9 mmol/L (ref 3.5–5.1)
Sodium: 140 mmol/L (ref 135–145)

## 2022-06-17 SURGERY — EXCISION MASS
Anesthesia: General | Laterality: Right

## 2022-06-17 MED ORDER — BUPIVACAINE-EPINEPHRINE (PF) 0.25% -1:200000 IJ SOLN
INTRAMUSCULAR | Status: AC
  Filled 2022-06-17: qty 30

## 2022-06-17 MED ORDER — PROPOFOL 10 MG/ML IV BOLUS
INTRAVENOUS | Status: AC
  Filled 2022-06-17: qty 20

## 2022-06-17 MED ORDER — LIDOCAINE 2% (20 MG/ML) 5 ML SYRINGE
INTRAMUSCULAR | Status: AC
  Filled 2022-06-17: qty 5

## 2022-06-17 MED ORDER — ONDANSETRON HCL 4 MG/2ML IJ SOLN
INTRAMUSCULAR | Status: AC
  Filled 2022-06-17: qty 2

## 2022-06-17 MED ORDER — HEPARIN SODIUM (PORCINE) 5000 UNIT/ML IJ SOLN
INTRAMUSCULAR | Status: AC
  Administered 2022-06-17: 5000 [IU] via SUBCUTANEOUS
  Filled 2022-06-17: qty 1

## 2022-06-17 MED ORDER — CHLORHEXIDINE GLUCONATE CLOTH 2 % EX PADS: 6.0000 | MEDICATED_PAD | Freq: Once | CUTANEOUS | Status: DC

## 2022-06-17 MED ORDER — MIDAZOLAM HCL 2 MG/2ML IJ SOLN
INTRAMUSCULAR | Status: DC | PRN
  Administered 2022-06-17: 2 mg via INTRAVENOUS

## 2022-06-17 MED ORDER — ORAL CARE MOUTH RINSE: 15.0000 mL | Freq: Once | OROMUCOSAL | Status: AC

## 2022-06-17 MED ORDER — OXYCODONE HCL 5 MG PO TABS
5.0000 mg | ORAL_TABLET | Freq: Once | ORAL | Status: AC | PRN
  Administered 2022-06-17: 5 mg via ORAL

## 2022-06-17 MED ORDER — PHENYLEPHRINE 80 MCG/ML (10ML) SYRINGE FOR IV PUSH (FOR BLOOD PRESSURE SUPPORT)
PREFILLED_SYRINGE | INTRAVENOUS | Status: AC
  Filled 2022-06-17: qty 10

## 2022-06-17 MED ORDER — FENTANYL CITRATE (PF) 100 MCG/2ML IJ SOLN
INTRAMUSCULAR | Status: DC | PRN
  Administered 2022-06-17 (×3): 50 ug via INTRAVENOUS

## 2022-06-17 MED ORDER — LACTATED RINGERS IV SOLN: INTRAVENOUS | Status: DC

## 2022-06-17 MED ORDER — 0.9 % SODIUM CHLORIDE (POUR BTL) OPTIME
TOPICAL | Status: DC | PRN
  Administered 2022-06-17: 1000 mL

## 2022-06-17 MED ORDER — FENTANYL CITRATE (PF) 250 MCG/5ML IJ SOLN
INTRAMUSCULAR | Status: AC
  Filled 2022-06-17: qty 5

## 2022-06-17 MED ORDER — AMISULPRIDE (ANTIEMETIC) 5 MG/2ML IV SOLN
10.0000 mg | Freq: Once | INTRAVENOUS | Status: AC | PRN
  Administered 2022-06-17: 10 mg via INTRAVENOUS

## 2022-06-17 MED ORDER — IBUPROFEN 600 MG PO TABS: 600.0000 mg | ORAL_TABLET | Freq: Four times a day (QID) | ORAL | 1 refills | Status: DC | PRN

## 2022-06-17 MED ORDER — METHOCARBAMOL 750 MG PO TABS: 750.0000 mg | ORAL_TABLET | Freq: Four times a day (QID) | ORAL | 1 refills | Status: DC

## 2022-06-17 MED ORDER — AMISULPRIDE (ANTIEMETIC) 5 MG/2ML IV SOLN
INTRAVENOUS | Status: AC
  Filled 2022-06-17: qty 4

## 2022-06-17 MED ORDER — LIDOCAINE 2% (20 MG/ML) 5 ML SYRINGE
INTRAMUSCULAR | Status: DC | PRN
  Administered 2022-06-17: 60 mg via INTRAVENOUS

## 2022-06-17 MED ORDER — ACETAMINOPHEN 500 MG PO TABS
1000.0000 mg | ORAL_TABLET | Freq: Once | ORAL | Status: AC
  Administered 2022-06-17: 1000 mg via ORAL
  Filled 2022-06-17: qty 2

## 2022-06-17 MED ORDER — BUPIVACAINE-EPINEPHRINE (PF) 0.25% -1:200000 IJ SOLN
INTRAMUSCULAR | Status: DC | PRN
  Administered 2022-06-17: 30 mL

## 2022-06-17 MED ORDER — SUGAMMADEX SODIUM 200 MG/2ML IV SOLN
INTRAVENOUS | Status: DC | PRN
  Administered 2022-06-17: 200 mg via INTRAVENOUS

## 2022-06-17 MED ORDER — CHLORHEXIDINE GLUCONATE 0.12 % MT SOLN
15.0000 mL | Freq: Once | OROMUCOSAL | Status: AC
  Administered 2022-06-17: 15 mL via OROMUCOSAL
  Filled 2022-06-17: qty 15

## 2022-06-17 MED ORDER — MIDAZOLAM HCL 2 MG/2ML IJ SOLN
INTRAMUSCULAR | Status: AC
  Filled 2022-06-17: qty 2

## 2022-06-17 MED ORDER — CEFAZOLIN SODIUM-DEXTROSE 2-4 GM/100ML-% IV SOLN
2.0000 g | INTRAVENOUS | Status: AC
  Administered 2022-06-17: 2 g via INTRAVENOUS

## 2022-06-17 MED ORDER — PHENYLEPHRINE 80 MCG/ML (10ML) SYRINGE FOR IV PUSH (FOR BLOOD PRESSURE SUPPORT)
PREFILLED_SYRINGE | INTRAVENOUS | Status: DC | PRN
  Administered 2022-06-17 (×2): 160 ug via INTRAVENOUS

## 2022-06-17 MED ORDER — OXYCODONE HCL 5 MG/5ML PO SOLN: 5.0000 mg | Freq: Once | ORAL | Status: AC | PRN

## 2022-06-17 MED ORDER — ROCURONIUM BROMIDE 10 MG/ML (PF) SYRINGE
PREFILLED_SYRINGE | INTRAVENOUS | Status: AC
  Filled 2022-06-17: qty 10

## 2022-06-17 MED ORDER — KETOROLAC TROMETHAMINE 30 MG/ML IJ SOLN
INTRAMUSCULAR | Status: DC | PRN
  Administered 2022-06-17: 30 mg via INTRAVENOUS

## 2022-06-17 MED ORDER — OXYCODONE HCL 5 MG PO TABS
ORAL_TABLET | ORAL | Status: AC
  Filled 2022-06-17: qty 1

## 2022-06-17 MED ORDER — CEFAZOLIN SODIUM-DEXTROSE 2-4 GM/100ML-% IV SOLN
INTRAVENOUS | Status: AC
  Filled 2022-06-17: qty 100

## 2022-06-17 MED ORDER — HYDROMORPHONE HCL 1 MG/ML IJ SOLN: 0.2500 mg | INTRAMUSCULAR | Status: DC | PRN

## 2022-06-17 MED ORDER — SCOPOLAMINE 1 MG/3DAYS TD PT72
1.0000 | MEDICATED_PATCH | TRANSDERMAL | Status: DC
  Administered 2022-06-17: 1.5 mg via TRANSDERMAL
  Filled 2022-06-17: qty 1

## 2022-06-17 MED ORDER — ONDANSETRON HCL 4 MG/2ML IJ SOLN
INTRAMUSCULAR | Status: DC | PRN
  Administered 2022-06-17: 4 mg via INTRAVENOUS

## 2022-06-17 MED ORDER — DEXAMETHASONE SODIUM PHOSPHATE 10 MG/ML IJ SOLN
INTRAMUSCULAR | Status: DC | PRN
  Administered 2022-06-17: 10 mg via INTRAVENOUS

## 2022-06-17 MED ORDER — ACETAMINOPHEN 500 MG PO TABS: 1000.0000 mg | ORAL_TABLET | Freq: Four times a day (QID) | ORAL | 3 refills | Status: DC | PRN

## 2022-06-17 MED ORDER — ONDANSETRON HCL 4 MG/2ML IJ SOLN
4.0000 mg | Freq: Once | INTRAMUSCULAR | Status: AC | PRN
  Administered 2022-06-17: 4 mg via INTRAVENOUS

## 2022-06-17 MED ORDER — ROCURONIUM BROMIDE 10 MG/ML (PF) SYRINGE
PREFILLED_SYRINGE | INTRAVENOUS | Status: DC | PRN
  Administered 2022-06-17: 70 mg via INTRAVENOUS

## 2022-06-17 MED ORDER — HEPARIN SODIUM (PORCINE) 5000 UNIT/ML IJ SOLN: 5000.0000 [IU] | Freq: Once | INTRAMUSCULAR | Status: AC

## 2022-06-17 MED ORDER — PROPOFOL 10 MG/ML IV BOLUS
INTRAVENOUS | Status: DC | PRN
  Administered 2022-06-17: 130 mg via INTRAVENOUS

## 2022-06-17 MED ORDER — KETOROLAC TROMETHAMINE 30 MG/ML IJ SOLN
INTRAMUSCULAR | Status: AC
  Filled 2022-06-17: qty 1

## 2022-06-17 MED ORDER — OXYCODONE HCL 5 MG PO TABS: 5.0000 mg | ORAL_TABLET | ORAL | 0 refills | Status: DC | PRN

## 2022-06-17 MED ORDER — DOCUSATE SODIUM 100 MG PO CAPS: 100.0000 mg | ORAL_CAPSULE | Freq: Two times a day (BID) | ORAL | 2 refills | Status: DC

## 2022-06-17 MED ORDER — DEXAMETHASONE SODIUM PHOSPHATE 10 MG/ML IJ SOLN
INTRAMUSCULAR | Status: AC
  Filled 2022-06-17: qty 1

## 2022-06-17 SURGICAL SUPPLY — 30 items
BAG COUNTER SPONGE SURGICOUNT (BAG) ×1 IMPLANT
CANISTER SUCT 3000ML PPV (MISCELLANEOUS) ×1 IMPLANT
CNTNR URN SCR LID CUP LEK RST (MISCELLANEOUS) IMPLANT
CONT SPEC 4OZ STRL OR WHT (MISCELLANEOUS) ×1
COVER SURGICAL LIGHT HANDLE (MISCELLANEOUS) ×1 IMPLANT
DRAPE LAPAROSCOPIC ABDOMINAL (DRAPES) IMPLANT
DRAPE LAPAROTOMY 100X72 PEDS (DRAPES) IMPLANT
DRSG TELFA 3X8 NADH (GAUZE/BANDAGES/DRESSINGS) ×1 IMPLANT
ELECT BLADE 4.0 EZ CLEAN MEGAD (MISCELLANEOUS) ×1
ELECT REM PT RETURN 9FT ADLT (ELECTROSURGICAL) ×1
ELECTRODE BLDE 4.0 EZ CLN MEGD (MISCELLANEOUS) IMPLANT
ELECTRODE REM PT RTRN 9FT ADLT (ELECTROSURGICAL) ×1 IMPLANT
GAUZE SPONGE 4X4 12PLY STRL (GAUZE/BANDAGES/DRESSINGS) IMPLANT
GLOVE BIO SURGEON STRL SZ 6.5 (GLOVE) ×1 IMPLANT
GLOVE BIOGEL PI IND STRL 6 (GLOVE) ×1 IMPLANT
GLOVE BIOGEL PI IND STRL 7.0 (GLOVE) IMPLANT
GOWN STRL REUS W/ TWL LRG LVL3 (GOWN DISPOSABLE) ×2 IMPLANT
GOWN STRL REUS W/TWL LRG LVL3 (GOWN DISPOSABLE) ×2
KIT BASIN (CUSTOM PROCEDURE TRAY) ×1 IMPLANT
KIT TURNOVER KIT B (KITS) ×1 IMPLANT
NS IRRIG 1000ML POUR BTL (IV SOLUTION) ×1 IMPLANT
PACK GENERAL/GYN (CUSTOM PROCEDURE TRAY) ×1 IMPLANT
PAD ARMBOARD 7.5X6 YLW CONV (MISCELLANEOUS) ×1 IMPLANT
PAD DRESSING TELFA 3X8 NADH (GAUZE/BANDAGES/DRESSINGS) IMPLANT
SUT ETHILON 2 0 FS 18 (SUTURE) IMPLANT
SUT MNCRL AB 4-0 PS2 18 (SUTURE) IMPLANT
SUT VIC AB 2-0 FS1 27 (SUTURE) IMPLANT
SUT VIC AB 2-0 SH 18 (SUTURE) IMPLANT
TOWEL GREEN STERILE (TOWEL DISPOSABLE) ×1 IMPLANT
TOWEL GREEN STERILE FF (TOWEL DISPOSABLE) ×1 IMPLANT

## 2022-06-17 NOTE — Op Note (Signed)
   Operative Note   Date: 06/17/2022  Procedure: excision of soft tissue mass, right upper back; excision of skin lesion of back  Pre-op diagnosis: soft tissue mass, right upper back (8x4.9x6.8cm); skin lesion, central back (1.9x1cm) Post-op diagnosis: same  Indication and clinical history: The patient is a 61 y.o. year old female with soft tissue mass, right upper back (8x4.9x6.8cm); skin lesion, central back (1.9x1cm)  Surgeon: Jesusita Oka, MD  Anesthesiologist: Doroteo Glassman, DO Anesthesia: General  Findings:  Specimen: soft tissue mass, right upper back; skin lesion, back (orientation: short-superior, long-lateral, double-deep) EBL: 15cc Drains/Implants: none  Disposition: PACU - hemodynamically stable.  Description of procedure: The patient was positioned supine on the operating room table. General anesthetic induction and intubation were uneventful. Foley catheter insertion was performed and was atraumatic. Time-out was performed verifying correct patient, procedure, signature of informed consent, and administration of pre-operative antibiotics. The back was prepped and draped in the usual sterile fashion.  A transverse incision was made overlying the right upper back mass and the mass circumferentially excised and sent to pathology as a permanent specimen. The wound was closed in layers using vicryl suture, a subcuticular 4-0 monocryl, and 2-0 nylon mattress sutures after irrigation and confirmation of hemostasis. Next, the skin lesion was excised using an elliptical incision. The mass was oriented with undyed vicryl suture: short-superior, long-lateral, double-deep and sent to pathology. The wound was closed with one layer of vicryl, one layer of monocryl, and one layer of nylon.   Sterile dressings were applied. All sponge and instrument counts were correct at the conclusion of the procedure. The patient was awakened from anesthesia, extubated uneventfully, and transported to the  PACU in good condition. There were no complications.    Jesusita Oka, MD General and [facility]

## 2022-06-17 NOTE — Transfer of Care (Signed)
Immediate Anesthesia Transfer of Care Note  Patient: Mikayla Tucker  Procedure(s) Performed: EXCISION OF SOFT TISSUE MASS RIGHT UPPER BACK, EXCISION OF RIGHT SHOULDER LESION (Right)  Patient Location: PACU  Anesthesia Type:General  Level of Consciousness: awake, alert  and oriented  Airway & Oxygen Therapy: Patient Spontanous Breathing  Post-op Assessment: Report given to RN  Post vital signs: Reviewed and stable  Last Vitals:  Vitals Value Taken Time  BP 1496/86   Temp    Pulse 94 06/17/22 0928  Resp    SpO2 94 % 06/17/22 0928  Vitals shown include unvalidated device data.  Last Pain:  Vitals:   06/17/22 0557  TempSrc:   PainSc: 0-No pain      Patients Stated Pain Goal: 0 (82/08/13 8871)  Complications: No notable events documented.

## 2022-06-17 NOTE — Anesthesia Procedure Notes (Signed)
Procedure Name: Intubation Date/Time: 06/17/2022 7:38 AM  Performed by: Barrington Ellison, CRNAPre-anesthesia Checklist: Patient identified, Emergency Drugs available, Suction available and Patient being monitored Patient Re-evaluated:Patient Re-evaluated prior to induction Oxygen Delivery Method: Circle System Utilized Preoxygenation: Pre-oxygenation with 100% oxygen Induction Type: IV induction Ventilation: Mask ventilation without difficulty Laryngoscope Size: Mac and 3 Grade View: Grade I Tube type: Oral Tube size: 7.0 mm Number of attempts: 1 Airway Equipment and Method: Stylet and Oral airway Placement Confirmation: ETT inserted through vocal cords under direct vision, positive ETCO2 and breath sounds checked- equal and bilateral Secured at: 21 cm Tube secured with: Tape Dental Injury: Teeth and Oropharynx as per pre-operative assessment

## 2022-06-17 NOTE — Anesthesia Postprocedure Evaluation (Signed)
Anesthesia Post Note  Patient: Mikayla Tucker  Procedure(s) Performed: EXCISION OF SOFT TISSUE MASS RIGHT UPPER BACK, EXCISION OF RIGHT SHOULDER LESION (Right)     Patient location during evaluation: PACU Anesthesia Type: General Level of consciousness: awake and alert, oriented and patient cooperative Pain management: pain level controlled Vital Signs Assessment: post-procedure vital signs reviewed and stable Respiratory status: spontaneous breathing, nonlabored ventilation and respiratory function stable Cardiovascular status: blood pressure returned to baseline and stable Postop Assessment: no apparent nausea or vomiting Anesthetic complications: no   No notable events documented.  Last Vitals:  Vitals:   06/17/22 1000 06/17/22 1015  BP: (!) 151/90 (!) 145/86  Pulse: 84 85  Resp: 14 13  Temp:  36.7 C  SpO2: 93% 94%    Last Pain:  Vitals:   06/17/22 1013  TempSrc:   PainSc: [place]

## 2022-06-17 NOTE — Discharge Instructions (Addendum)
May shower beginning 06/18/2022. Remove dressing 06/19/2022 and okay to continue to shower. May allow warm soapy water to run over incision, then rinse and pat dry. Do not soak in any water (tubs, hot tubs, pools, lakes, oceans) for one week. No lifting your arms above the level of your shoulders for one week. May resume sexual activity when it is comfortable.   Pain regimen: take over-the-counter tylenol (acetaminophen) '1000mg'$  every six hours, the prescription ibuprofen ('600mg'$ ) every six hours and the robaxin (methocarbamol) '750mg'$  every six hours. With all three of these, you should be taking something every two hours. Example: tylenol ( acetaminophen) at 8am, ibuprofen at 10am, robaxin (methocarbamol) at 12pm, tylenol (acetaminophen) again at 2pm, ibuprofen again at 4pm, robaxin (methocarbamol) at 6pm. You also have a prescription for oxycodone, which should be taken if the tylenol (acetaminophen), ibuprofen, and robaxin (methocarbamol) are not enough to control your pain. You may take the oxycodone as frequently as every four hours as needed, but if you are taking the other medications as above, you should not need the oxycodone this frequently. You have also been given a prescription for colace (docusate) which is a stool softener. Please take this as prescribed because the oxycodone can cause constipation and the colace (docusate) will minimize or prevent constipation. Do not drive while taking or under the influence of the oxycodone as it is a narcotic medication.  Call the office at [phone] for temperature greater than 101.74F, worsening pain, redness or warmth at the incision site.  Please call [phone] to make an appointment for 2-3 weeks after surgery for wound check.

## 2022-06-17 NOTE — H&P (Addendum)
Doreena Maulden Fruchter is an 61 y.o. female.   HPI: 35F with soft tissue mass of R upepr back. The patient has had no hospitalizations, ER visits, or newly diagnosed allergies since being seen in the office. Seh did have a cyst removed from her R index finger ~3w ago by Dr. Amedeo Plenty, no post-op issues.    Past Medical History:  Diagnosis Date   Acute appendicitis 05/14/2021   Allergy    BPPV (benign paroxysmal positional vertigo)    COVID-19    03/10/21   Golfer's elbow, left 05/27/2020   Hx of adenomatous polyp of colon 2017   Hyperlipidemia    Lipoma of back 06/14/2014   throracic back, R. US shows lipoma. No intervention recommended.    PONV (postoperative nausea and vomiting)    Rotator cuff tendinitis, right 07/22/2018   Squamous cell skin cancer    SCC    Past Surgical History:  Procedure Laterality Date   APPENDECTOMY     BACK SURGERY     BREAST SURGERY     biopsy   CHOLECYSTECTOMY     COLONOSCOPY     LAPAROSCOPIC APPENDECTOMY N/A 05/14/2021   Procedure: APPENDECTOMY LAPAROSCOPIC;  Surgeon: Clovis Riley, MD;  Location: [facility];  Service: General;  Laterality: N/A;   LIPOMA EXCISION     under breast on R side   MICRODISCECTOMY LUMBAR      Family History  Adopted: Yes  Problem Relation Age of Onset   Colon cancer Neg Hx     Social History:  reports that she has quit smoking. Her smoking use included cigarettes. She has a 10.00 pack-year smoking history. She has never used smokeless tobacco. She reports current alcohol use of about 7.0 standard drinks of alcohol per week. She reports that she does not use drugs.  Allergies: No Known Allergies  Medications: I have reviewed the patient's current medications.  Results for orders placed or performed during the hospital encounter of 06/17/22 (from the past 48 hour(s))  Basic metabolic panel per protocol     Status: Abnormal   Collection Time: 06/17/22  5:53 AM  Result Value Ref Range   Sodium 140 135 - 145 mmol/L    Potassium 3.9 3.5 - 5.1 mmol/L   Chloride 108 98 - 111 mmol/L   CO2 23 22 - 32 mmol/L   Glucose, Bld 107 (H) 70 - 99 mg/dL    Comment: Glucose reference range applies only to samples taken after fasting for at least 8 hours.   BUN 9 6 - 20 mg/dL   Creatinine, Ser 0.54 0.44 - 1.00 mg/dL   Calcium 9.4 8.9 - 10.3 mg/dL   GFR, Estimated >60 >60 mL/min    Comment: (NOTE) Calculated using the CKD-EPI Creatinine Equation (2021)    Anion gap 9 5 - 15    Comment: Performed at [facility] [address] [place], [place] [address]  CBC per protocol     Status: Abnormal   Collection Time: 06/17/22  5:53 AM  Result Value Ref Range   WBC 6.8 4.0 - 10.5 K/uL   RBC 3.79 (L) 3.87 - 5.11 MIL/uL   Hemoglobin 13.8 12.0 - 15.0 g/dL   HCT 39.0 36.0 - 46.0 %   MCV 102.9 (H) 80.0 - 100.0 fL   MCH 36.4 (H) 26.0 - 34.0 pg   MCHC 35.4 30.0 - 36.0 g/dL   RDW 12.1 11.5 - 15.5 %   Platelets 306 150 - 400 K/uL  nRBC 0.0 0.0 - 0.2 %    Comment: Performed at [facility], [place] [address], [place], [place] [address]    No results found.  ROS 10 point review of systems is negative except as listed above in HPI.   Physical Exam Blood pressure (!) 155/92, pulse 82, temperature 98.3 F (36.8 C), temperature source Oral, resp. rate 18, height '5\' 4"'$  (1.626 m), weight 69.4 kg, SpO2 94 %. Constitutional: well-developed, well-nourished HEENT: pupils equal, round, reactive to light, 2m b/l, moist conjunctiva, external inspection of ears and nose normal, hearing intact Oropharynx: normal oropharyngeal mucosa, normal dentition Neck: no thyromegaly, trachea midline, no midline cervical tenderness to palpation Chest: breath sounds equal bilaterally, normal respiratory effort, no midline or lateral chest wall tenderness to palpation/deformity Abdomen: soft, NT, no bruising, no hepatosplenomegaly GU: normal female genitalia  Back: no wounds, no thoracic/lumbar spine tenderness to palpation, no  thoracic/lumbar spine stepoffs Rectal: deferred Extremities: 2+ radial and pedal pulses bilaterally, intact motor and sensation bilateral UE and LE, no peripheral edema MSK: normal gait/station, no clubbing/cyanosis of fingers/toes, normal ROM of all four extremities, R upper back mass ~5x5cm Skin: warm, dry, no rashes Psych: normal memory, normal mood/affect     Assessment/Plan: 43F with soft tissue mass of R upper back, here today for removal. Informed consent was obtained after detailed explanation of risks, including bleeding, infection, hematoma/seroma, wound dehiscence, temporary or permanent neuropathy, recurrence. All questions answered to the patient's satisfaction.   AJesusita Oka MD General and [facility]

## 2022-06-17 NOTE — Anesthesia Preprocedure Evaluation (Addendum)
Anesthesia Evaluation  Patient identified by MRN, date of birth, ID band Patient awake    Reviewed: Allergy & Precautions, NPO status , Patient's Chart, lab work & pertinent test results  History of Anesthesia Complications (+) PONV and history of anesthetic complications  Airway Mallampati: III  TM Distance: >3 FB Neck ROM: Full    Dental no notable dental hx. (+) Teeth Intact, Dental Advisory Given   Pulmonary former smoker,  10 pack year history  Snores at night, no history of sleep study    Pulmonary exam normal breath sounds clear to auscultation       Cardiovascular hypertension (155/92 in preop, normally 125/70s), Normal cardiovascular exam Rhythm:Regular Rate:Normal     Neuro/Psych negative neurological ROS  negative psych ROS   GI/Hepatic negative GI ROS, (+)       alcohol use,   Endo/Other  negative endocrine ROS  Renal/GU negative Renal ROS  negative genitourinary   Musculoskeletal negative musculoskeletal ROS (+)   Abdominal   Peds negative pediatric ROS (+)  Hematology negative hematology ROS (+)   Anesthesia Other Findings Right upper back soft tissue mass  Reproductive/Obstetrics negative OB ROS                            Anesthesia Physical Anesthesia Plan  ASA: 1  Anesthesia Plan: General   Post-op Pain Management:    Induction: Intravenous  PONV Risk Score and Plan: Ondansetron, Dexamethasone, Midazolam, Scopolamine patch - Pre-op, Treatment may vary due to age or medical condition and Diphenhydramine  Airway Management Planned: Oral ETT  Additional Equipment: None  Intra-op Plan:   Post-operative Plan: Extubation in OR  Informed Consent: I have reviewed the patients History and Physical, chart, labs and discussed the procedure including the risks, benefits and alternatives for the proposed anesthesia with the patient or authorized representative who  has indicated his/her understanding and acceptance.     Dental advisory given  Plan Discussed with: CRNA  Anesthesia Plan Comments:         Anesthesia Quick Evaluation

## 2022-06-18 ENCOUNTER — Encounter (HOSPITAL_COMMUNITY): Payer: Self-pay | Admitting: Surgery

## 2022-06-18 LAB — SURGICAL PATHOLOGY

## 2022-07-02 ENCOUNTER — Ambulatory Visit (INDEPENDENT_AMBULATORY_CARE_PROVIDER_SITE_OTHER): Payer: Commercial Managed Care - PPO | Admitting: Family Medicine

## 2022-07-02 ENCOUNTER — Encounter: Payer: Self-pay | Admitting: Family Medicine

## 2022-07-02 VITALS — BP 110/72 | HR 73 | Temp 98.3°F | Ht 64.0 in | Wt 154.0 lb

## 2022-07-02 DIAGNOSIS — I700 Atherosclerosis of aorta: Secondary | ICD-10-CM

## 2022-07-02 DIAGNOSIS — E782 Mixed hyperlipidemia: Secondary | ICD-10-CM | POA: Diagnosis not present

## 2022-07-02 DIAGNOSIS — Z23 Encounter for immunization: Secondary | ICD-10-CM

## 2022-07-02 DIAGNOSIS — E559 Vitamin D deficiency, unspecified: Secondary | ICD-10-CM | POA: Diagnosis not present

## 2022-07-02 DIAGNOSIS — Z79899 Other long term (current) drug therapy: Secondary | ICD-10-CM

## 2022-07-02 DIAGNOSIS — G479 Sleep disorder, unspecified: Secondary | ICD-10-CM

## 2022-07-02 DIAGNOSIS — R7303 Prediabetes: Secondary | ICD-10-CM

## 2022-07-02 DIAGNOSIS — E663 Overweight: Secondary | ICD-10-CM

## 2022-07-02 DIAGNOSIS — D7589 Other specified diseases of blood and blood-forming organs: Secondary | ICD-10-CM

## 2022-07-02 DIAGNOSIS — E2839 Other primary ovarian failure: Secondary | ICD-10-CM

## 2022-07-02 DIAGNOSIS — Z0000 Encounter for general adult medical examination without abnormal findings: Secondary | ICD-10-CM | POA: Diagnosis not present

## 2022-07-02 HISTORY — DX: Sleep disorder, unspecified: G47.9

## 2022-07-02 LAB — COMPREHENSIVE METABOLIC PANEL
ALT: 39 U/L — ABNORMAL HIGH (ref 0–35)
AST: 22 U/L (ref 0–37)
Albumin: 4.7 g/dL (ref 3.5–5.2)
Alkaline Phosphatase: 109 U/L (ref 39–117)
BUN: 12 mg/dL (ref 6–23)
CO2: 26 mEq/L (ref 19–32)
Calcium: 10.2 mg/dL (ref 8.4–10.5)
Chloride: 101 mEq/L (ref 96–112)
Creatinine, Ser: 0.62 mg/dL (ref 0.40–1.20)
GFR: 96.74 mL/min (ref >60.00)
Glucose, Bld: 97 mg/dL (ref 70–99)
Potassium: 4.8 mEq/L (ref 3.5–5.1)
Sodium: 138 mEq/L (ref 135–145)
Total Bilirubin: 0.6 mg/dL (ref 0.2–1.2)
Total Protein: 7.8 g/dL (ref 6.0–8.3)

## 2022-07-02 LAB — LIPID PANEL
Cholesterol: 251 mg/dL — ABNORMAL HIGH (ref 0–200)
HDL: 61.6 mg/dL (ref >39.00)
NonHDL: 189.84
Total CHOL/HDL Ratio: 4
Triglycerides: 299 mg/dL — ABNORMAL HIGH (ref 0.0–149.0)
VLDL: 59.8 mg/dL — ABNORMAL HIGH (ref 0.0–40.0)

## 2022-07-02 LAB — CBC
HCT: 40.1 % (ref 36.0–46.0)
Hemoglobin: 13.6 g/dL (ref 12.0–15.0)
MCHC: 33.9 g/dL (ref 30.0–36.0)
MCV: 104.8 fl — ABNORMAL HIGH (ref 78.0–100.0)
Platelets: 397 10*3/uL (ref 150.0–400.0)
RBC: 3.82 Mil/uL — ABNORMAL LOW (ref 3.87–5.11)
RDW: 12.7 % (ref 11.5–15.5)
WBC: 8.5 10*3/uL (ref 4.0–10.5)

## 2022-07-02 LAB — VITAMIN D 25 HYDROXY (VIT D DEFICIENCY, FRACTURES): VITD: 63.23 ng/mL (ref 30.00–100.00)

## 2022-07-02 LAB — TSH: TSH: 1.69 u[IU]/mL (ref 0.35–5.50)

## 2022-07-02 LAB — LDL CHOLESTEROL, DIRECT: Direct LDL: 147 mg/dL

## 2022-07-02 LAB — HEMOGLOBIN A1C: Hgb A1c MFr Bld: 6.2 % (ref 4.6–6.5)

## 2022-07-02 MED ORDER — FLUCONAZOLE 150 MG PO TABS: 150.0000 mg | ORAL_TABLET | Freq: Once | ORAL | 0 refills | Status: AC

## 2022-07-02 MED ORDER — ATORVASTATIN CALCIUM 80 MG PO TABS: 80.0000 mg | ORAL_TABLET | Freq: Every day | ORAL | 3 refills | Status: DC

## 2022-07-02 MED ORDER — EZETIMIBE 10 MG PO TABS: 10.0000 mg | ORAL_TABLET | Freq: Every day | ORAL | 3 refills | Status: DC

## 2022-07-02 NOTE — Progress Notes (Unsigned)
Patient ID: Mikayla Tucker, female  DOB: 21-Aug-1961, 61 y.o.   MRN: [id number] Patient Care Team    Relationship Specialty Notifications Start End  Ma Hillock, DO PCP - General Family Medicine  08/07/15   Kennon Holter, NP Nurse Practitioner Nurse Practitioner  02/27/16    Comment: Gynecology  Gatha Mayer, MD Consulting Physician Gastroenterology  04/30/17     Chief Complaint  Patient presents with   Annual Exam    [facility]; pt is fasting    Subjective:  Mikayla Tucker is a 61 y.o.  Female  present for [place] All past medical history, surgical history, allergies, family history, immunizations, medications and social history were updated in the electronic medical record today. All recent labs, ED visits and hospitalizations within the last year were reviewed.  Health maintenance:  Colonoscopy: Completed 07/04/2016, 7 year recall. One colon polyp. Family history unknown (adopted). Completed by Dr. Carlean Purl. Mammogram: Completed 05/2022, [facility]- gyn orderedp Cervical cancer screening: Completed 2019, [facility].  Immunizations: tdap UTD 02/27/2016,flu administered today, Shingrix series  completed , covid series completed Infectious disease screening: HIV and hepatitis C completed Bone density: DEXA ordered at gyn completed-requested records Assistive device: None Oxygen use: None Patient has a Dental home. Hospitalizations/ED visits: Reviewed  Lipoma: recently surgically removed- recovering well.  Left finger pain: Patient reports the tip of her left ring finger has been painful.  She denies any swelling or drainage.  She states it is mildly red.  She does have false nails. Sleep disturbance: She reports she has not been sleeping well recently.-Because she is taking the gabapentin. *** She stopped the trazodone for now, but would like it refilled if she needs it when she stops the gabapentin which she plans to do shortly.    Mixed hyperlipidemia/Overweight (BMI  25.0-29.9) Patient reports compliance with atorvastatin 80 mg daily and Zetia 10 mg daily.  Macrocytosis without anemia Has been stable without decrease in other cell lines.  She had follow-up with hematology, which felt that she could continue to follow-up every 6 months with PCP with CBC.  She reports she reports her alcohol use ***  Vitamin D deficiency She is supplementing.      07/02/2022    9:01 AM 12/05/2021   10:48 AM 06/28/2021   10:28 AM 02/19/2021    2:12 PM 06/27/2020   10:11 AM  Depression screen PHQ 2/9  Decreased Interest 0 0 0 0 0  Down, Depressed, Hopeless 1 0 0 0 0  PHQ - 2 Score 1 0 0 0 0      05/09/2019    1:44 PM 06/03/2018   10:09 AM  GAD 7 : Generalized Anxiety Score  Nervous, Anxious, on Edge 0 0  Control/stop worrying 0 0  Worry too much - different things 0 0  Trouble relaxing 0 0  Restless 0 0  Easily annoyed or irritable 0 0  Afraid - awful might happen 0 0  Total GAD 7 Score 0 0  Anxiety Difficulty Not difficult at all            Immunization History  Administered Date(s) Administered   Influenza Split 07/28/2017   Influenza,inj,Quad PF,6+ Mos 06/13/2014, 11/12/2016, 06/03/2018, 06/29/2019, 06/27/2020, 06/03/2021   PFIZER(Purple Top)SARS-COV-2 Vaccination 12/16/2019, 01/06/2020, 09/07/2020   Tdap 02/27/2016   Zoster Recombinat (Shingrix) 06/03/2018, 08/20/2018     Past Medical History:  Diagnosis Date   Acute appendicitis 05/14/2021   Allergy    BPPV (benign paroxysmal  positional vertigo)    COVID-19    03/10/21   Golfer's elbow, left 05/27/2020   Hx of adenomatous polyp of colon 2017   Hyperlipidemia    Lipoma of back 06/14/2014   throracic back, R. US shows lipoma. No intervention recommended.    PONV (postoperative nausea and vomiting)    Rotator cuff tendinitis, right 07/22/2018   Squamous cell skin cancer    SCC   No Known Allergies Past Surgical History:  Procedure Laterality Date   APPENDECTOMY     BACK SURGERY      BREAST SURGERY     biopsy   CHOLECYSTECTOMY     COLONOSCOPY     LAPAROSCOPIC APPENDECTOMY N/A 05/14/2021   Procedure: APPENDECTOMY LAPAROSCOPIC;  Surgeon: Clovis Riley, MD;  Location: [place];  Service: General;  Laterality: N/A;   LIPOMA EXCISION     under breast on R side   MASS EXCISION Right 06/17/2022   Procedure: EXCISION OF SOFT TISSUE MASS RIGHT UPPER BACK, EXCISION OF RIGHT SHOULDER LESION;  Surgeon: Jesusita Oka, MD;  Location: [place];  Service: General;  Laterality: Right;   MICRODISCECTOMY LUMBAR     Family History  Adopted: Yes  Problem Relation Age of Onset   Colon cancer Neg Hx    Social History   Social History Narrative   Ms. Proia lives with her husband and daughter. She is originally from [place]-moved to [place]. She relocated to [facility] about [place] from [place].    Allergies as of 07/02/2022   No Known Allergies      Medication List        Accurate as of July 02, 2022  9:25 AM. If you have any questions, ask your nurse or doctor.          STOP taking these medications    acetaminophen 500 MG tablet Commonly known as: TYLENOL Stopped by: Howard Pouch, DO   docusate sodium 100 MG capsule Commonly known as: Colace Stopped by: Howard Pouch, DO   fluocinolone 0.01 % cream Commonly known as: VANOS Stopped by: Howard Pouch, DO   ibuprofen 600 MG tablet Commonly known as: ADVIL Stopped by: Howard Pouch, DO   meloxicam 7.5 MG tablet Commonly known as: MOBIC Stopped by: Howard Pouch, DO   oxyCODONE 5 MG immediate release tablet Commonly known as: Roxicodone Stopped by: Howard Pouch, DO       TAKE these medications    atorvastatin 80 MG tablet Commonly known as: LIPITOR Take 1 tablet (80 mg total) by mouth daily.   b complex vitamins capsule Take 1 capsule by mouth daily.   cyanocobalamin 1000 MCG tablet Commonly known as: VITAMIN B12 Take 1,000 mcg by mouth daily.   ezetimibe 10 MG tablet Commonly known as: ZETIA Take  1 tablet (10 mg total) by mouth daily.   fexofenadine 180 MG tablet Commonly known as: ALLEGRA Take 180 mg by mouth daily.   gabapentin 300 MG capsule Commonly known as: NEURONTIN Take 300 mg by mouth 2 (two) times daily.   methocarbamol 750 MG tablet Commonly known as: Robaxin-750 Take 1 tablet (750 mg total) by mouth 4 (four) times daily.   traZODone 50 MG tablet Commonly known as: DESYREL Take 0.5-1 tablets (25-50 mg total) by mouth at bedtime as needed for sleep.   Vitamin D (Cholecalciferol) 25 MCG (1000 UT) Tabs Take 2,000 Units by mouth daily.        All past medical history, surgical history, allergies, family history,  immunizations andmedications were updated in the EMR today and reviewed under the history and medication portions of their EMR.      No results found.   ROS 14 pt review of systems performed and negative (unless mentioned in an HPI)  Objective: BP 110/72   Pulse 73   Temp 98.3 F (36.8 C) (Oral)   Ht '5\' 4"'$  (1.626 m)   Wt 154 lb (69.9 kg)   SpO2 97%   BMI 26.43 kg/m  Physical Exam Vitals and nursing note reviewed.  Constitutional:      General: She is not in acute distress.    Appearance: Normal appearance. She is not ill-appearing or toxic-appearing.  HENT:     Head: Normocephalic and atraumatic.     Right Ear: Tympanic membrane, ear canal and external ear normal. There is no impacted cerumen.     Left Ear: Tympanic membrane, ear canal and external ear normal. There is no impacted cerumen.     Nose: No congestion or rhinorrhea.     Mouth/Throat:     Mouth: Mucous membranes are moist.     Pharynx: Oropharynx is clear. No oropharyngeal exudate or posterior oropharyngeal erythema.  Eyes:     General: No scleral icterus.       Right eye: No discharge.        Left eye: No discharge.     Extraocular Movements: Extraocular movements intact.     Conjunctiva/sclera: Conjunctivae normal.     Pupils: Pupils are equal, round, and reactive to  light.  Cardiovascular:     Rate and Rhythm: Normal rate and regular rhythm.     Pulses: Normal pulses.     Heart sounds: Normal heart sounds. No murmur heard.    No friction rub. No gallop.  Pulmonary:     Effort: Pulmonary effort is normal. No respiratory distress.     Breath sounds: Normal breath sounds. No stridor. No wheezing, rhonchi or rales.  Chest:     Chest wall: No tenderness.  Abdominal:     General: Abdomen is flat. Bowel sounds are normal. There is no distension.     Palpations: Abdomen is soft. There is no mass.     Tenderness: There is no abdominal tenderness. There is no right CVA tenderness, left CVA tenderness, guarding or rebound.     Hernia: No hernia is present.  Musculoskeletal:        General: No swelling, tenderness or deformity. Normal range of motion.     Cervical back: Normal range of motion and neck supple. No rigidity or tenderness.     Right lower leg: No edema.     Left lower leg: No edema.  Lymphadenopathy:     Cervical: No cervical adenopathy.  Skin:    General: Skin is warm and dry.     Coloration: Skin is not jaundiced or pale.     Findings: No bruising, erythema, lesion or rash.  Neurological:     General: No focal deficit present.     Mental Status: She is alert and oriented to person, place, and time. Mental status is at baseline.     Cranial Nerves: No cranial nerve deficit.     Sensory: No sensory deficit.     Motor: No weakness.     Coordination: Coordination normal.     Gait: Gait normal.     Deep Tendon Reflexes: Reflexes normal.  Psychiatric:        Mood and Affect: Mood normal.  Behavior: Behavior normal.        Thought Content: Thought content normal.        Judgment: Judgment normal.        No results found.  Assessment/plan: EMMERSYN KRATZKE is a 62 y.o. female present for CPE/CMC Mixed hyperlipidemia/overweight Stable.   Continue Zetia and Lipitor 80 mg daily  - follow yearly-fasting labs due next visit at  cpe  Macrocytosis without anemia Has seen heme and only needs to follow again if other cell lines are abnormal.  Cbc q 6 mos follow up recommend by provider appt. If macrocytosis is present.  Labs due next visit Vitamin D deficiency/hypocalcemia - supplementing 2000u qd -labs due next visit vit d and calcium  Sleep disturbance: Stable.  Continue trazodone 25 mg to 75 mg taper as needed.  She May restart after tapering off gabapentin for her back.  Prediabetes Has been stable.  Last A1c down to 5.2.  Labs due next visit  Lipoma of torso Rather large lipoma of her upper back.  It is starting to become uncomfortable as it grows.  She would like to consider surgical removal.  Will refer to [facility].  Infection of nail bed of finger of left hand Very mild infection of nail fold.  There is some erythema present without drainage or abscess.  Discussed soaking in warm water and Epsom salt soaks 15 minutes at a time daily until she starts seeing improvement.  Encouraged her to have her false nail removed until completely healed. After soaking placed Bactroban cream twice daily If worsening, she is to be seen immediately to see if antibiotics are warranted at that time.  Patient reports understanding    Patient was encouraged to exercise greater than 150 minutes a week. Patient was encouraged to choose a diet filled with fresh fruits and vegetables, and lean meats. AVS provided to patient today for education/recommendation on gender specific health and safety maintenance. Return in about 24 weeks (around 12/17/2022) for Routine chronic condition follow-up.  Orders Placed This Encounter  Procedures   Flu Vaccine QUAD 6+ mos PF IM (Fluarix Quad PF)   CBC   Comprehensive metabolic panel   Hemoglobin A1c   Lipid panel   TSH   VITAMIN D 25 Hydroxy (Vit-D Deficiency, Fractures)    Orders Placed This Encounter  Procedures   Flu Vaccine QUAD 6+ mos PF IM (Fluarix Quad PF)   CBC    Comprehensive metabolic panel   Hemoglobin A1c   Lipid panel   TSH   VITAMIN D 25 Hydroxy (Vit-D Deficiency, Fractures)   No orders of the defined types were placed in this encounter.  Referral Orders  No referral(s) requested today     Electronically signed by: Howard Pouch, [place]

## 2022-07-02 NOTE — Patient Instructions (Addendum)
Return in about 24 weeks (around 12/17/2022) for Routine chronic condition follow-up.   RSV vaccine recommended.  Bivalent covid booster first- then consider the monovalent new covid vaccine at least 3 months after the booster.  Remember to get the ultrasound for left ovary cyst at your gynecology       Great to see you today.  I have refilled the medication(s) we provide.   If labs were collected, we will inform you of lab results once received either by echart message or telephone call.   - echart message- for normal results that have been seen by the patient already.   - telephone call: abnormal results or if patient has not viewed results in their echart.   Health Maintenance, Female Adopting a healthy lifestyle and getting preventive care are important in promoting health and wellness. Ask your health care provider about: The right schedule for you to have regular tests and exams. Things you can do on your own to prevent diseases and keep yourself healthy. What should I know about diet, weight, and exercise? Eat a healthy diet  Eat a diet that includes plenty of vegetables, fruits, low-fat dairy products, and lean protein. Do not eat a lot of foods that are high in solid fats, added sugars, or sodium. Maintain a healthy weight Body mass index (BMI) is used to identify weight problems. It estimates body fat based on height and weight. Your health care provider can help determine your BMI and help you achieve or maintain a healthy weight. Get regular exercise Get regular exercise. This is one of the most important things you can do for your health. Most adults should: Exercise for at least 150 minutes each week. The exercise should increase your heart rate and make you sweat (moderate-intensity exercise). Do strengthening exercises at least twice a week. This is in addition to the moderate-intensity exercise. Spend less time sitting. Even light physical activity can be  beneficial. Watch cholesterol and blood lipids Have your blood tested for lipids and cholesterol at 60 years of age, then have this test every 5 years. Have your cholesterol levels checked more often if: Your lipid or cholesterol levels are high. You are older than 60 years of age. You are at high risk for heart disease. What should I know about cancer screening? Depending on your health history and family history, you may need to have cancer screening at various ages. This may include screening for: Breast cancer. Cervical cancer. Colorectal cancer. Skin cancer. Lung cancer. What should I know about heart disease, diabetes, and high blood pressure? Blood pressure and heart disease High blood pressure causes heart disease and increases the risk of stroke. This is more likely to develop in people who have high blood pressure readings or are overweight. Have your blood pressure checked: Every 3-5 years if you are 59-6 years of age. Every year if you are 52 years old or older. Diabetes Have regular diabetes screenings. This checks your fasting blood sugar level. Have the screening done: Once every three years after age 106 if you are at a normal weight and have a low risk for diabetes. More often and at a younger age if you are overweight or have a high risk for diabetes. What should I know about preventing infection? Hepatitis B If you have a higher risk for hepatitis B, you should be screened for this virus. Talk with your health care provider to find out if you are at risk for hepatitis B infection. Hepatitis C  Testing is recommended for: Everyone born from [birth year] through 1964. Anyone with known risk factors for hepatitis C. Sexually transmitted infections (STIs) Get screened for STIs, including gonorrhea and chlamydia, if: You are sexually active and are younger than 60 years of age. You are older than 60 years of age and your health care provider tells you that you are at risk for  this type of infection. Your sexual activity has changed since you were last screened, and you are at increased risk for chlamydia or gonorrhea. Ask your health care provider if you are at risk. Ask your health care provider about whether you are at high risk for HIV. Your health care provider may recommend a prescription medicine to help prevent HIV infection. If you choose to take medicine to prevent HIV, you should first get tested for HIV. You should then be tested every 3 months for as long as you are taking the medicine. Pregnancy If you are about to stop having your period (premenopausal) and you may become pregnant, seek counseling before you get pregnant. Take 400 to 800 micrograms (mcg) of folic acid every day if you become pregnant. Ask for birth control (contraception) if you want to prevent pregnancy. Osteoporosis and menopause Osteoporosis is a disease in which the bones lose minerals and strength with aging. This can result in bone fractures. If you are 41 years old or older, or if you are at risk for osteoporosis and fractures, ask your health care provider if you should: Be screened for bone loss. Take a calcium or vitamin D supplement to lower your risk of fractures. Be given hormone replacement therapy (HRT) to treat symptoms of menopause. Follow these instructions at home: Alcohol use Do not drink alcohol if: Your health care provider tells you not to drink. You are pregnant, may be pregnant, or are planning to become pregnant. If you drink alcohol: Limit how much you have to: 0-1 drink a day. Know how much alcohol is in your drink. In the [place] one drink equals one 12 oz bottle of beer (355 mL), one 5 oz glass of wine (148 mL), or one 1 oz glass of hard liquor (44 mL). Lifestyle Do not use any products that contain nicotine or tobacco. These products include cigarettes, chewing tobacco, and vaping devices, such as e-cigarettes. If you need help quitting, ask your health  care provider. Do not use street drugs. Do not share needles. Ask your health care provider for help if you need support or information about quitting drugs. General instructions Schedule regular health, dental, and eye exams. Stay current with your vaccines. Tell your health care provider if: You often feel depressed. You have ever been abused or do not feel safe at home. Summary Adopting a healthy lifestyle and getting preventive care are important in promoting health and wellness. Follow your health care provider's instructions about healthy diet, exercising, and getting tested or screened for diseases. Follow your health care provider's instructions on monitoring your cholesterol and blood pressure. This information is not intended to replace advice given to you by your health care provider. Make sure you discuss any questions you have with your health care provider. Document Revised: 02/11/2021 Document Reviewed: 02/11/2021 Elsevier Patient Education  [employer].

## 2022-07-03 ENCOUNTER — Telehealth: Payer: Self-pay | Admitting: Family Medicine

## 2022-07-03 MED ORDER — FENOFIBRATE 145 MG PO TABS: 145.0000 mg | ORAL_TABLET | Freq: Every day | ORAL | 3 refills | Status: DC

## 2022-07-03 NOTE — Telephone Encounter (Signed)
Spoke with pt regarding labs and instructions.   

## 2022-07-03 NOTE — Telephone Encounter (Signed)
Please call patient kidney and thyroid function are normal Liver function continues to decrease closer to normal.  Her AST is now completely normal and her ALT is 39 which is almost normal. Vitamin D levels look great Blood cell counts and electrolytes are normal. The size of the red blood cell is still elevated, but stable from prior collections.  The HDL/good cholesterol is excellent at 61.  Her LDL still 147.  I would encourage her to make sure she is taking her atorvastatin 80 before bed, the Zetia and I also called in a new medication called fenofibrate which helps lower the triglyceride portion.  Her triglycerides have been elevated the last 2 collections and this last time they were even higher at 299.  Goal triglycerides is less than 150.  Diabetes screening/A1c is higher this time at 6.2, was 5.9.  She still has a normal fasting glucose which is reassuring.  However 6.2 is in the prediabetic range.  Would encourage her to follow a low sugar diet full of fresh fruits vegetables and lean meats.  Once fully recuperated from her surgery, encouraged her to get into a regular exercise routine.  We will repeat her A1c at her normal 18-monthfollow-up

## 2022-07-21 ENCOUNTER — Telehealth: Payer: Self-pay

## 2022-07-21 NOTE — Telephone Encounter (Signed)
Patient is taking cholesterol meds at night, she thinks it may be keeping her up, she is having problems sleeping since the change.   Can she take cholesterol med in the morning?  Please advise.  [phone]

## 2022-07-22 NOTE — Telephone Encounter (Signed)
Patient aware of Dr. Lucita Lora directions regarding her cholesterol meds.  Copied and pasted below: "Atorvastatin should be taken after dinner or before bed. The Zetia and fenofibrate can be taken at anytime of the day."  No further questions or concerns at this time.  Close  encounter.

## 2022-07-22 NOTE — Telephone Encounter (Signed)
Atorvastatin should be taken after dinner or before bed. The Zetia and fenofibrate can be taken at anytime of the day.

## 2022-07-22 NOTE — Telephone Encounter (Signed)
Spoke with patient regarding results/recommendations.  

## 2022-07-30 ENCOUNTER — Other Ambulatory Visit: Payer: Self-pay | Admitting: Family Medicine

## 2022-07-30 NOTE — Telephone Encounter (Signed)
Pt had back surgery but has now been released from the surgeon. She is wanting to continue Gabapentin as she still has neuropathy from that surgery and Gabapentin helps. Surgery office informed her PCP would need to continue refills on that medication if needed.

## 2022-07-30 NOTE — Telephone Encounter (Signed)
VV scheduled.

## 2022-07-30 NOTE — Telephone Encounter (Signed)
Medicine has not been prescribed by [place] in the past.  Patient would need an appointment to discuss.  May be virtual

## 2022-08-05 ENCOUNTER — Ambulatory Visit (INDEPENDENT_AMBULATORY_CARE_PROVIDER_SITE_OTHER): Payer: Commercial Managed Care - PPO | Admitting: Family Medicine

## 2022-08-05 ENCOUNTER — Encounter: Payer: Self-pay | Admitting: Family Medicine

## 2022-08-05 VITALS — BP 113/77 | HR 84 | Temp 98.6°F | Wt 155.4 lb

## 2022-08-05 DIAGNOSIS — R29898 Other symptoms and signs involving the musculoskeletal system: Secondary | ICD-10-CM

## 2022-08-05 DIAGNOSIS — M5116 Intervertebral disc disorders with radiculopathy, lumbar region: Secondary | ICD-10-CM

## 2022-08-05 MED ORDER — GABAPENTIN 300 MG PO CAPS: ORAL_CAPSULE | ORAL | 1 refills | Status: DC

## 2022-08-05 MED ORDER — MELOXICAM 15 MG PO TABS: 15.0000 mg | ORAL_TABLET | Freq: Every day | ORAL | 1 refills | Status: DC

## 2022-08-05 NOTE — Progress Notes (Signed)
Mikayla Tucker , Apr 11, 1961, 61 y.o., female MRN: [id number] Patient Care Team    Relationship Specialty Notifications Start End  Ma Hillock, DO PCP - General Family Medicine  08/07/15   Kennon Holter, NP Nurse Practitioner Nurse Practitioner  02/27/16    Comment: Gynecology  Gatha Mayer, MD Consulting Physician Gastroenterology  04/30/17     Chief Complaint  Patient presents with   Pain Management     Subjective: Pt presents for an OV to discuss her gabapentin use for her neuropathy/herniation of nucleus pulposus of the lumbar intervertebral disc with sciatica.  She reports she has been prescribed gabapentin 300 mg twice daily by her orthopedic team.  However when she asked for refill they told her to follow-up with her PCP for further refills.  She endorses continued neuropathy but possibly a fraction improved.  She is also taking meloxicam and would like this prescribed as well if possible.  She states couple days ago she had a leg weakness where it felt like her leg was going to give out on a couple occasions.  That had not happened before and has not happened since but she does feel her right leg has become little more weak.       07/02/2022    9:01 AM 12/05/2021   10:48 AM 06/28/2021   10:28 AM 02/19/2021    2:12 PM 06/27/2020   10:11 AM  Depression screen PHQ 2/9  Decreased Interest 0 0 0 0 0  Down, Depressed, Hopeless 1 0 0 0 0  PHQ - 2 Score 1 0 0 0 0    No Known Allergies Social History   Social History Narrative   Ms. Deleonardis lives with her husband and daughter. She is originally from [place]-moved to [place]. She relocated to [facility] about [place] from [place].   Past Medical History:  Diagnosis Date   Acute appendicitis 05/14/2021   Allergy    BPPV (benign paroxysmal positional vertigo)    COVID-19    03/10/21   Golfer's elbow, left 05/27/2020   Hx of adenomatous polyp of colon 2017   Hyperlipidemia    Lipoma of back 06/14/2014   throracic back, R.  US shows lipoma. No intervention recommended.    PONV (postoperative nausea and vomiting)    Rotator cuff tendinitis, right 07/22/2018   Squamous cell skin cancer    SCC   Past Surgical History:  Procedure Laterality Date   APPENDECTOMY     BACK SURGERY     BREAST SURGERY     biopsy   CHOLECYSTECTOMY     COLONOSCOPY     LAPAROSCOPIC APPENDECTOMY N/A 05/14/2021   Procedure: APPENDECTOMY LAPAROSCOPIC;  Surgeon: Clovis Riley, MD;  Location: [facility];  Service: General;  Laterality: N/A;   LIPOMA EXCISION     under breast on R side   MASS EXCISION Right 06/17/2022   Procedure: EXCISION OF SOFT TISSUE MASS RIGHT UPPER BACK, EXCISION OF RIGHT SHOULDER LESION;  Surgeon: Jesusita Oka, MD;  Location: [facility];  Service: General;  Laterality: Right;   MICRODISCECTOMY LUMBAR     Family History  Adopted: Yes  Problem Relation Age of Onset   Colon cancer Neg Hx    Allergies as of 08/05/2022   No Known Allergies      Medication List        Accurate as of August 05, 2022 11:23 AM. If you have any questions, ask your  nurse or doctor.          STOP taking these medications    methocarbamol 750 MG tablet Commonly known as: Robaxin-750 Stopped by: Howard Pouch, DO       TAKE these medications    atorvastatin 80 MG tablet Commonly known as: LIPITOR Take 1 tablet (80 mg total) by mouth daily.   b complex vitamins capsule Take 1 capsule by mouth daily.   cyanocobalamin 1000 MCG tablet Commonly known as: VITAMIN B12 Take 1,000 mcg by mouth daily.   ezetimibe 10 MG tablet Commonly known as: ZETIA Take 1 tablet (10 mg total) by mouth daily.   fenofibrate 145 MG tablet Commonly known as: Tricor Take 1 tablet (145 mg total) by mouth daily.   fexofenadine 180 MG tablet Commonly known as: ALLEGRA Take 180 mg by mouth daily.   gabapentin 300 MG capsule Commonly known as: NEURONTIN 1 capsule in the morning and 2 capsules before bed. What changed:  how much to  take how to take this when to take this additional instructions Changed by: Howard Pouch, DO   meloxicam 15 MG tablet Commonly known as: MOBIC Take 1 tablet (15 mg total) by mouth daily. Started by: Howard Pouch, DO   traZODone 50 MG tablet Commonly known as: DESYREL Take 0.5-1 tablets (25-50 mg total) by mouth at bedtime as needed for sleep.   Vitamin D (Cholecalciferol) 25 MCG (1000 UT) Tabs Take 2,000 Units by mouth daily.        All past medical history, surgical history, allergies, family history, immunizations andmedications were updated in the EMR today and reviewed under the history and medication portions of their EMR.     ROS Negative, with the exception of above mentioned in HPI   Objective:  BP 113/77   Pulse 84   Temp 98.6 F (37 C)   Wt 155 lb 6.4 oz (70.5 kg)   SpO2 97%   BMI 26.67 kg/m  Body mass index is 26.67 kg/m. Physical Exam Vitals and nursing note reviewed.  Constitutional:      General: She is not in acute distress.    Appearance: Normal appearance. She is normal weight. She is not ill-appearing or toxic-appearing.  Eyes:     Extraocular Movements: Extraocular movements intact.     Conjunctiva/sclera: Conjunctivae normal.     Pupils: Pupils are equal, round, and reactive to light.  Neurological:     Mental Status: She is alert and oriented to person, place, and time. Mental status is at baseline.     Coordination: Coordination normal.     Gait: Gait normal.  Psychiatric:        Mood and Affect: Mood normal.        Behavior: Behavior normal.        Thought Content: Thought content normal.        Judgment: Judgment normal.      No results found. No results found. No results found for this or any previous visit (from the past 24 hour(s)).  Assessment/Plan: GWENEVERE Tucker is a 61 y.o. female present for OV for  Herniation of nucleus pulposus of lumbar intervertebral disc with sciatica/right leg weakness Discussed options with her  today for treatment. Increase gabapentin to 300 mg morning dose and 600 mg evening dose Continue Mobic 15 mg daily with food Referral to physical therapy in [facility] placed for her today to help with her strength in her right lower extremity. - Ambulatory referral to Physical Therapy  Reviewed expectations re: course of current medical issues. Discussed self-management of symptoms. Outlined signs and symptoms indicating need for more acute intervention. Patient verbalized understanding and all questions were answered. Patient received an After-Visit Summary.    Orders Placed This Encounter  Procedures   Ambulatory referral to Physical Therapy   Meds ordered this encounter  Medications   gabapentin (NEURONTIN) 300 MG capsule    Sig: 1 capsule in the morning and 2 capsules before bed.    Dispense:  270 capsule    Refill:  1   meloxicam (MOBIC) 15 MG tablet    Sig: Take 1 tablet (15 mg total) by mouth daily.    Dispense:  90 tablet    Refill:  1   Referral Orders         Ambulatory referral to Physical Therapy       Note is dictated utilizing voice recognition software. Although note has been proof read prior to signing, occasional typographical errors still can be missed. If any questions arise, please do not hesitate to call for verification.   electronically signed by:  Howard Pouch, DO  [place]

## 2022-08-05 NOTE — Patient Instructions (Signed)
No follow-ups on file.        Great to see you today.  I have refilled the medication(s) we provide.   If labs were collected, we will inform you of lab results once received either by echart message or telephone call.   - echart message- for normal results that have been seen by the patient already.   - telephone call: abnormal results or if patient has not viewed results in their echart.  

## 2022-08-08 ENCOUNTER — Telehealth: Payer: Self-pay | Admitting: Family Medicine

## 2022-08-08 NOTE — Telephone Encounter (Signed)
Pt called and said [facility] in [place] is saying the Gabapentin was not called in, however it shows that is was sent.  Patient is needing to pick this medication up today.

## 2022-08-08 NOTE — Telephone Encounter (Signed)
Spoke with pharmacy and Rx is ready for pick up. LVM for pt to call office

## 2022-11-29 IMAGING — MG DIGITAL DIAGNOSTIC BILAT W/ TOMO W/ CAD
8 of 14 series · 8 of 40 positions shown · non-contrast
Comparison: Previous exam(s).

CLINICAL DATA: 60-year-old female presenting with a new lump in the
right breast.

EXAM:
DIGITAL DIAGNOSTIC BILATERAL MAMMOGRAM WITH TOMOSYNTHESIS AND CAD;
ULTRASOUND RIGHT BREAST LIMITED
TECHNIQUE: Bilateral digital diagnostic mammography and breast tomosynthesis
was performed. The images were evaluated with computer-aided
detection.; Targeted ultrasound examination of the right breast was
performed

[R CC synth-2D (1 of 2)]
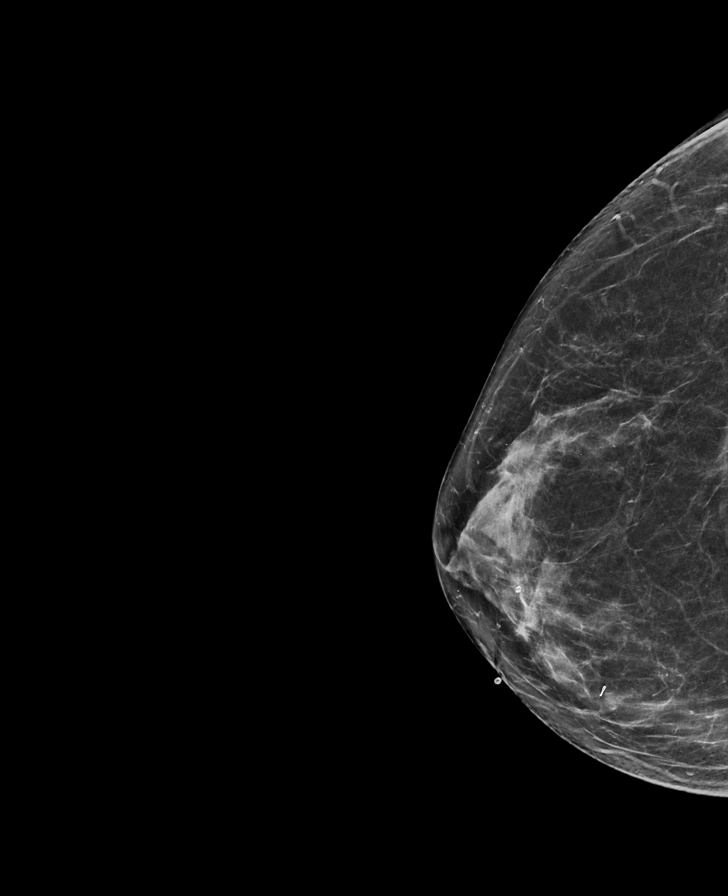

[L CC synth-2D]
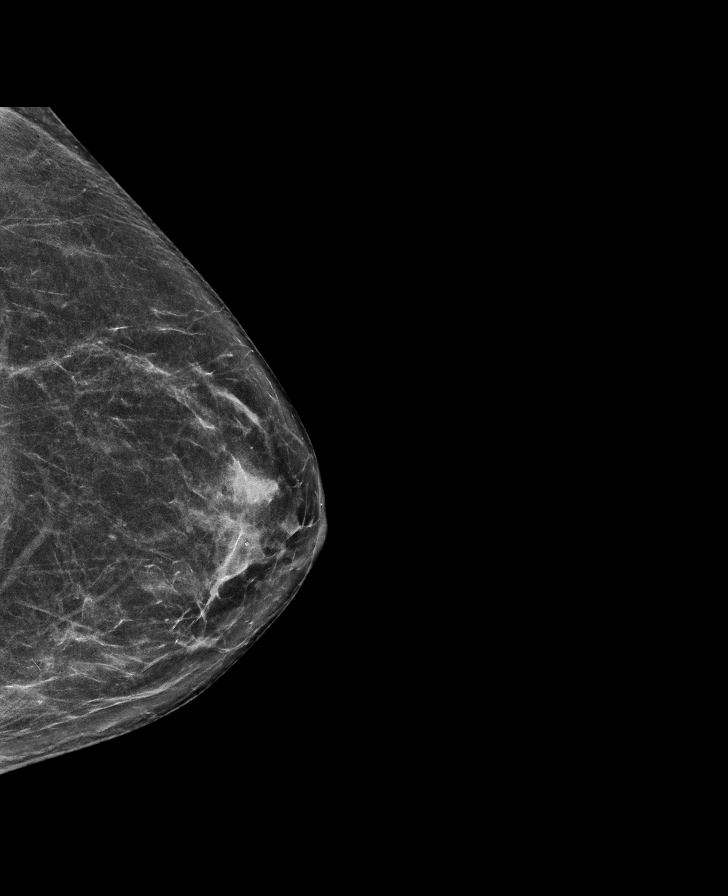

[L MLO synth-2D]
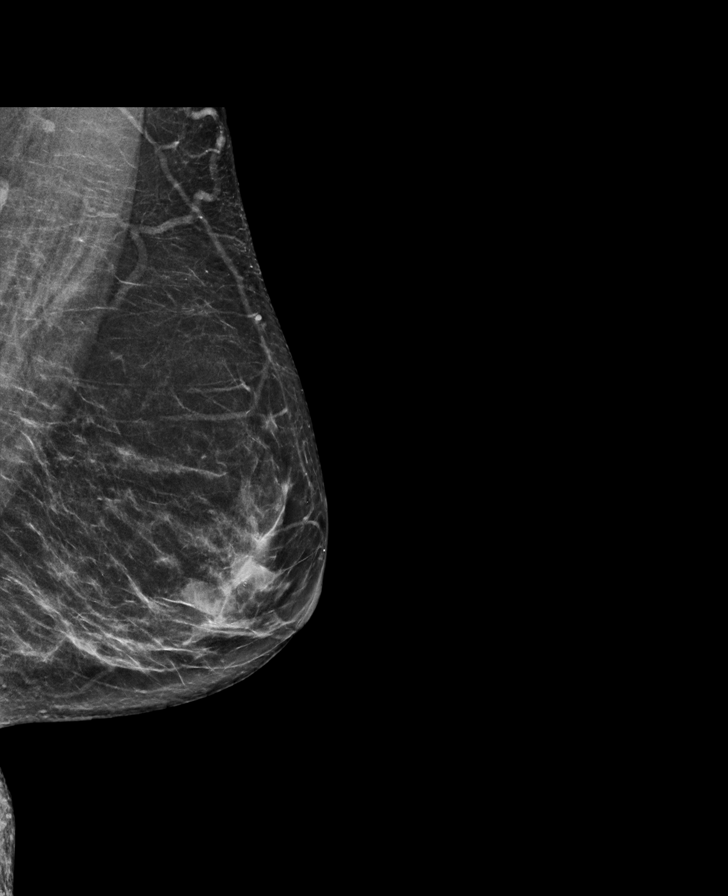

[R TAN synth-2D]
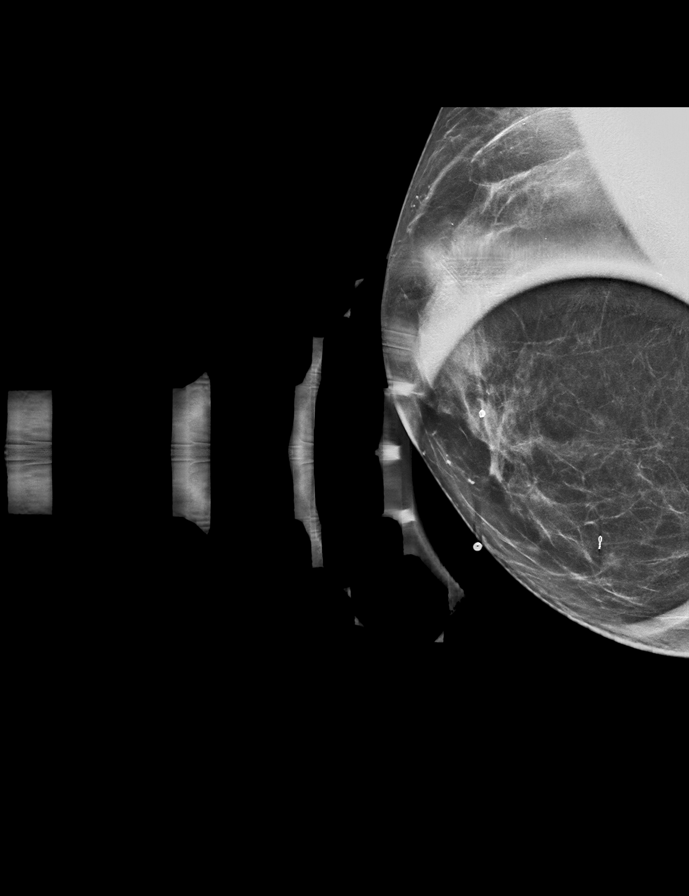

[R CC synth-2D (2 of 2)]
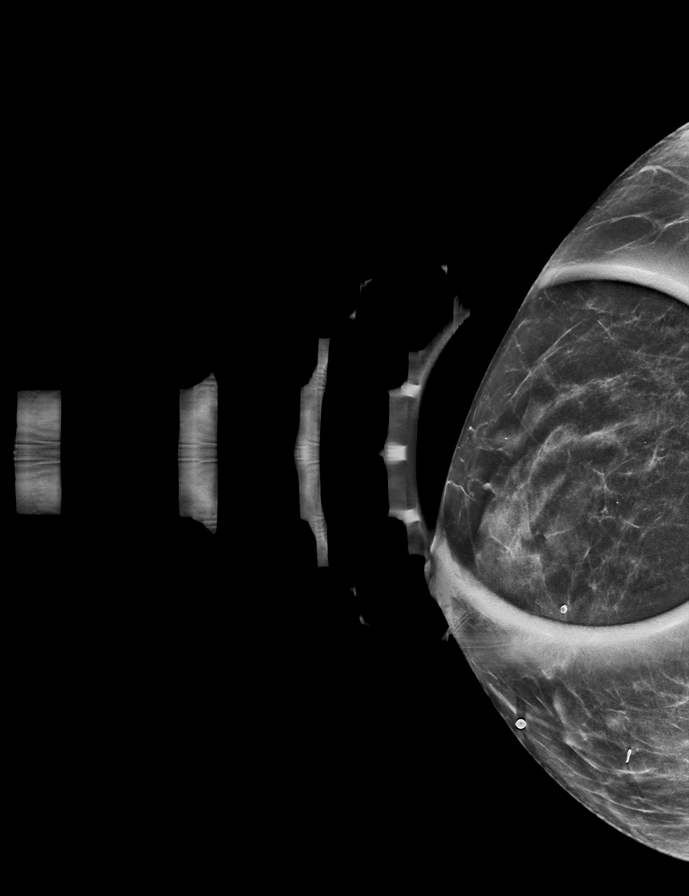

[R MLO synth-2D (1 of 2)]
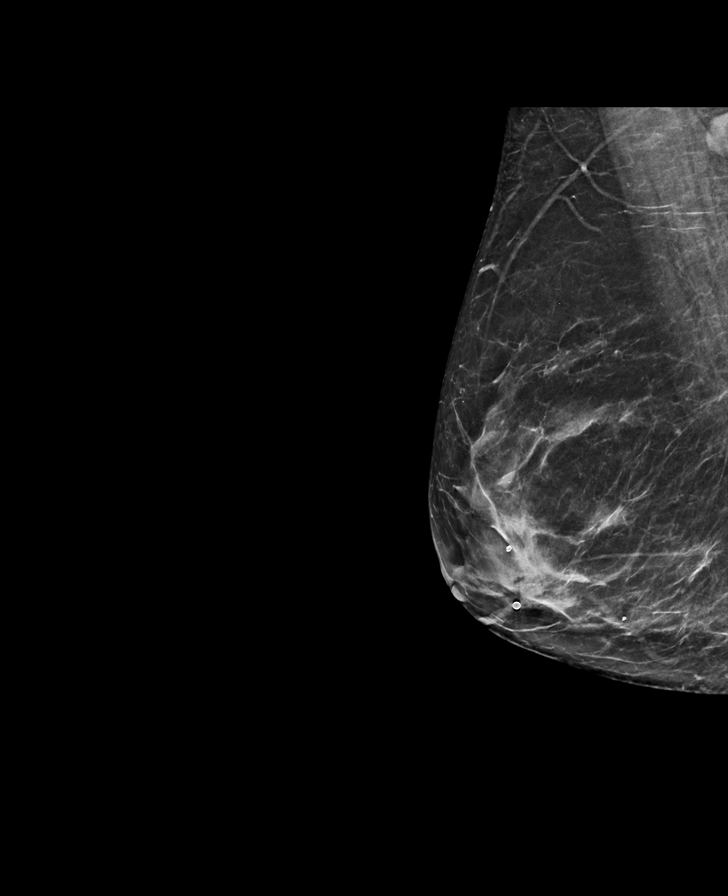

[R MLO synth-2D (2 of 2)]
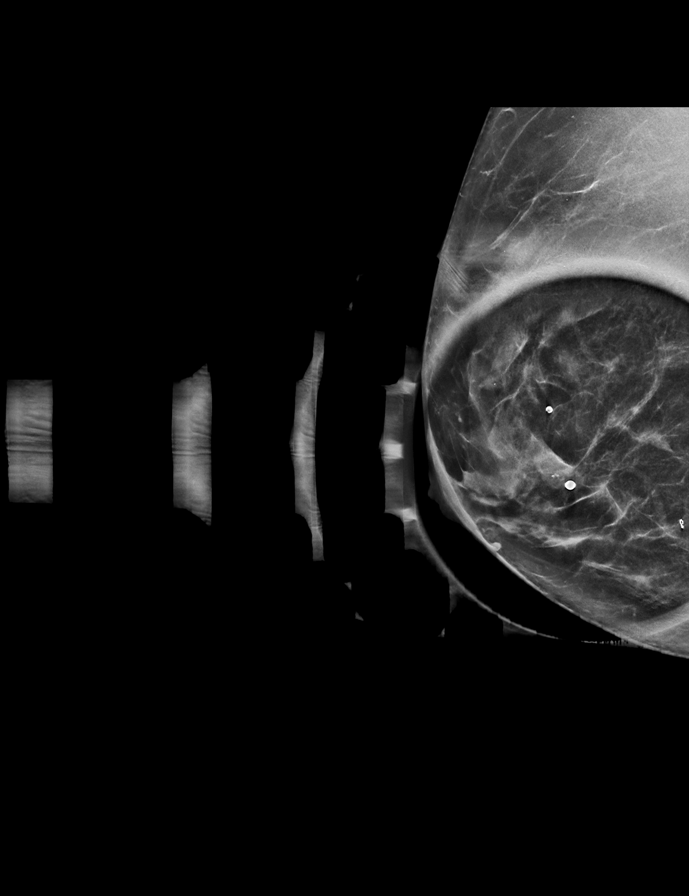

[R CC tomo · tomo slice 33/65.0]
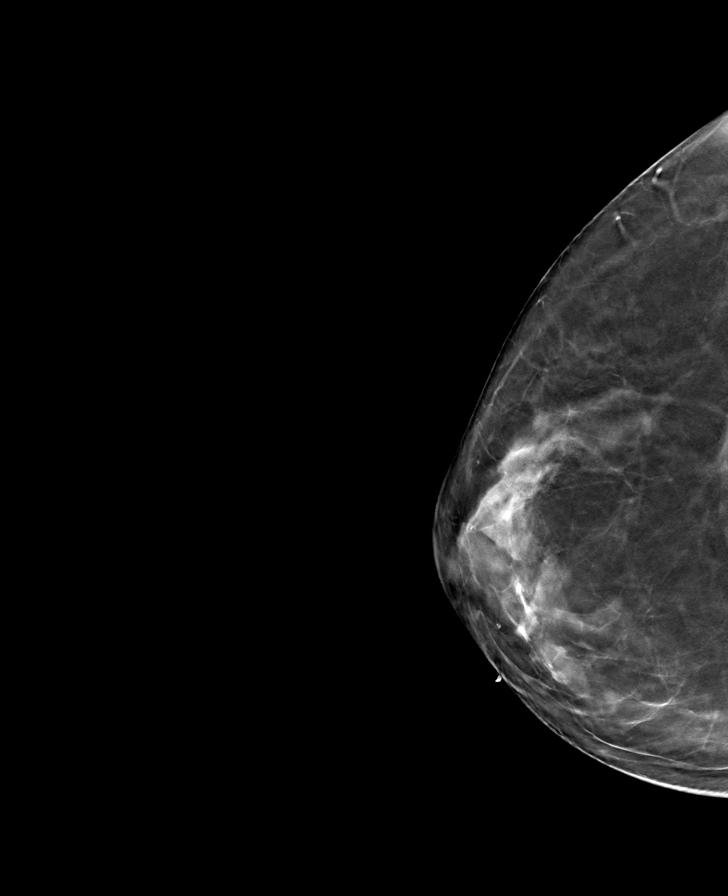

[8 of 40 positions shown; findings below may reference images not displayed]

ACR Breast Density Category b: There are scattered areas of
fibroglandular density.
FINDINGS: Mammogram:

Right breast: Skin BB marks the site of concern reported by the
patient in the lower inner right breast. A spot tangential view of
this area was performed in addition to standard views. There is no
new abnormality at the palpable site. Additionally a spot
compression tomosynthesis view was performed of the outer right
breast for a possible asymmetry. On the additional imaging the
tissue in this area disperses without persistent asymmetry, mass, or
distortion.

Left breast: No suspicious mass, distortion, or microcalcifications
are identified to suggest presence of malignancy.

On physical exam at the site of concern reported by the patient in
the lower inner right breast I feel a ridge of tissue without a
fixed discrete mass.

Ultrasound:

Targeted ultrasound performed throughout the lower inner aspect of
the right breast demonstrating no cystic or solid mass.
IMPRESSION: 1. No mammographic or sonographic evidence of malignancy or other
imaging abnormality at the palpable site of concern in the
lower-inner right breast.

2.  No mammographic evidence of malignancy in the left breast.

RECOMMENDATION:
1. Recommend any further workup of the palpable site in the right
breast be on a clinical basis.

2.  Screening mammogram in one year.(Code:0S-1-B49)

I have discussed the findings and recommendations with the patient.
If applicable, a reminder letter will be sent to the patient
regarding the next appointment.

BI-RADS CATEGORY  1: Negative.

## 2022-12-03 ENCOUNTER — Other Ambulatory Visit: Payer: Self-pay | Admitting: Family Medicine

## 2022-12-16 NOTE — Progress Notes (Unsigned)
Patient ID: Mikayla Tucker, female  DOB: [birth year], 61 y.o.   MRN: [id number] Patient Care Team    Relationship Specialty Notifications Start End  Ma Hillock, DO PCP - General Family Medicine  08/07/15   Kennon Holter, NP Nurse Practitioner Nurse Practitioner  02/27/16    Comment: Gynecology  Gatha Mayer, MD Consulting Physician Gastroenterology  04/30/17     No chief complaint on file.   Subjective:  Mikayla Tucker is a 62 y.o.  Female  present for Chronic Conditions/illness Management  All past medical history, surgical history, allergies, family history, immunizations, medications and social history were updated in the electronic medical record today. All recent labs, ED visits and hospitalizations within the last year were reviewed.   Mixed hyperlipidemia/Overweight (BMI 25.0-29.9) Patient reports compliance with atorvastatin 80 mg daily and Zetia 10 mg daily.  Labs up-to-date 06/2022  Macrocytosis without anemia Has been stable without decrease in other cell lines.  She had follow-up with hematology, which felt that she could continue to follow-up every 6 months with PCP with CBC.  She reports she reports her alcohol use is minimal.  Vitamin D deficiency She is supplementing.      07/02/2022    9:01 AM 12/05/2021   10:48 AM 06/28/2021   10:28 AM 02/19/2021    2:12 PM 06/27/2020   10:11 AM  Depression screen PHQ 2/9  Decreased Interest 0 0 0 0 0  Down, Depressed, Hopeless 1 0 0 0 0  PHQ - 2 Score 1 0 0 0 0      05/09/2019    1:44 PM 06/03/2018   10:09 AM  GAD 7 : Generalized Anxiety Score  Nervous, Anxious, on Edge 0 0  Control/stop worrying 0 0  Worry too much - different things 0 0  Trouble relaxing 0 0  Restless 0 0  Easily annoyed or irritable 0 0  Afraid - awful might happen 0 0  Total GAD 7 Score 0 0  Anxiety Difficulty Not difficult at all     Immunization History  Administered Date(s) Administered   COVID-19, mRNA, vaccine(Comirnaty)12  years and older 10/09/2022   Influenza Split 07/28/2017   Influenza,inj,Quad PF,6+ Mos 06/13/2014, 11/12/2016, 06/03/2018, 06/29/2019, 06/27/2020, 06/03/2021, 07/02/2022   PFIZER(Purple Top)SARS-COV-2 Vaccination 12/16/2019, 01/06/2020, 09/07/2020   Tdap 02/27/2016   Zoster Recombinat (Shingrix) 06/03/2018, 08/20/2018    Past Medical History:  Diagnosis Date   Acute appendicitis 05/14/2021   Allergy    BPPV (benign paroxysmal positional vertigo)    COVID-19    03/10/21   Golfer's elbow, left 05/27/2020   Hx of adenomatous polyp of colon 2017   Hyperlipidemia    Lipoma of back 06/14/2014   throracic back, R. US shows lipoma. No intervention recommended.    PONV (postoperative nausea and vomiting)    Rotator cuff tendinitis, right 07/22/2018   Squamous cell skin cancer    SCC   No Known Allergies Past Surgical History:  Procedure Laterality Date   APPENDECTOMY     BACK SURGERY     BREAST SURGERY     biopsy   CHOLECYSTECTOMY     COLONOSCOPY     LAPAROSCOPIC APPENDECTOMY N/A 05/14/2021   Procedure: APPENDECTOMY LAPAROSCOPIC;  Surgeon: Clovis Riley, MD;  Location: [facility];  Service: General;  Laterality: N/A;   LIPOMA EXCISION     under breast on R side   MASS EXCISION Right 06/17/2022   Procedure: EXCISION OF SOFT TISSUE MASS RIGHT UPPER  BACK, EXCISION OF RIGHT SHOULDER LESION;  Surgeon: Jesusita Oka, MD;  Location: [facility];  Service: General;  Laterality: Right;   MICRODISCECTOMY LUMBAR     Family History  Adopted: Yes  Problem Relation Age of Onset   Colon cancer Neg Hx    Social History   Social History Narrative   Mikayla Tucker lives with her husband and daughter. She is originally from [place]-moved to [place]. She relocated to [facility] about [place] from [place].    Allergies as of 12/17/2022   No Known Allergies      Medication List        Accurate as of December 16, 2022 12:38 PM. If you have any questions, ask your nurse or doctor.          atorvastatin  80 MG tablet Commonly known as: LIPITOR Take 1 tablet (80 mg total) by mouth daily.   b complex vitamins capsule Take 1 capsule by mouth daily.   cyanocobalamin 1000 MCG tablet Commonly known as: VITAMIN B12 Take 1,000 mcg by mouth daily.   ezetimibe 10 MG tablet Commonly known as: ZETIA Take 1 tablet (10 mg total) by mouth daily.   fenofibrate 145 MG tablet Commonly known as: Tricor Take 1 tablet (145 mg total) by mouth daily.   fexofenadine 180 MG tablet Commonly known as: ALLEGRA Take 180 mg by mouth daily.   gabapentin 300 MG capsule Commonly known as: NEURONTIN 1 capsule in the morning and 2 capsules before bed.   meloxicam 15 MG tablet Commonly known as: MOBIC Take 1 tablet (15 mg total) by mouth daily.   traZODone 50 MG tablet Commonly known as: DESYREL Take 0.5-1 tablets (25-50 mg total) by mouth at bedtime as needed for sleep.   Vitamin D (Cholecalciferol) 25 MCG (1000 UT) Tabs Take 2,000 Units by mouth daily.        All past medical history, surgical history, allergies, family history, immunizations andmedications were updated in the EMR today and reviewed under the history and medication portions of their EMR.      No results found.   ROS 14 pt review of systems performed and negative (unless mentioned in an HPI)  Objective: There were no vitals taken for this visit. Physical Exam Vitals and nursing note reviewed.  Constitutional:      General: She is not in acute distress.    Appearance: Normal appearance. She is not ill-appearing, toxic-appearing or diaphoretic.  HENT:     Head: Normocephalic and atraumatic.  Eyes:     General: No scleral icterus.       Right eye: No discharge.        Left eye: No discharge.     Extraocular Movements: Extraocular movements intact.     Conjunctiva/sclera: Conjunctivae normal.     Pupils: Pupils are equal, round, and reactive to light.  Cardiovascular:     Rate and Rhythm: Normal rate and regular rhythm.   Pulmonary:     Effort: Pulmonary effort is normal. No respiratory distress.     Breath sounds: Normal breath sounds. No wheezing, rhonchi or rales.  Musculoskeletal:     Right lower leg: No edema.     Left lower leg: No edema.  Skin:    General: Skin is warm.     Findings: No rash.  Neurological:     Mental Status: She is alert and oriented to person, place, and time. Mental status is at baseline.     Motor: No  weakness.     Gait: Gait normal.  Psychiatric:        Mood and Affect: Mood normal.        Behavior: Behavior normal.        Thought Content: Thought content normal.        Judgment: Judgment normal.     No results found.  Assessment/plan: Mikayla Tucker is a 62 y.o. female present for routine chronic conditions Mixed hyperlipidemia/overweight/aortic atherosclerosis Stable Continue Zetia and Lipitor 80 mg daily  Labs up-to-date.  Macrocytosis without anemia Has seen heme and only needs to follow again if other cell lines are abnormal.  Cbc q 6 mos follow up recommend by provider appt.  CBC with differential collected today  Vitamin D deficiency/B12 deficiency - supplementing 2000u qd Is up-to-date 06/2022  Prediabetes A1c 6.2. A1c collected today  Sleep disturbance*** Trazodone Lumbar fusion/lumbar degenerative disease*** Mobic Gabapentin  No follow-ups on file.    No orders of the defined types were placed in this encounter.  No orders of the defined types were placed in this encounter.  Referral Orders  No referral(s) requested today     Electronically signed by: Howard Pouch, [place]

## 2022-12-16 NOTE — Patient Instructions (Signed)
Return in about 7 months (around 07/06/2023) for cpe (20 min), Routine chronic condition follow-up.        Great to see you today.  I have refilled the medication(s) we provide.   If labs were collected, we will inform you of lab results once received either by echart message or telephone call.   - echart message- for normal results that have been seen by the patient already.   - telephone call: abnormal results or if patient has not viewed results in their echart.

## 2022-12-17 ENCOUNTER — Ambulatory Visit (INDEPENDENT_AMBULATORY_CARE_PROVIDER_SITE_OTHER): Payer: Commercial Managed Care - PPO | Admitting: Family Medicine

## 2022-12-17 ENCOUNTER — Encounter: Payer: Self-pay | Admitting: Family Medicine

## 2022-12-17 VITALS — BP 127/81 | HR 74 | Temp 98.2°F | Wt 155.2 lb

## 2022-12-17 DIAGNOSIS — E782 Mixed hyperlipidemia: Secondary | ICD-10-CM

## 2022-12-17 DIAGNOSIS — G479 Sleep disorder, unspecified: Secondary | ICD-10-CM

## 2022-12-17 DIAGNOSIS — Z79899 Other long term (current) drug therapy: Secondary | ICD-10-CM

## 2022-12-17 DIAGNOSIS — Z981 Arthrodesis status: Secondary | ICD-10-CM | POA: Diagnosis not present

## 2022-12-17 DIAGNOSIS — I700 Atherosclerosis of aorta: Secondary | ICD-10-CM | POA: Diagnosis not present

## 2022-12-17 DIAGNOSIS — R7303 Prediabetes: Secondary | ICD-10-CM | POA: Diagnosis not present

## 2022-12-17 DIAGNOSIS — D7589 Other specified diseases of blood and blood-forming organs: Secondary | ICD-10-CM

## 2022-12-17 DIAGNOSIS — E559 Vitamin D deficiency, unspecified: Secondary | ICD-10-CM

## 2022-12-17 LAB — CBC WITH DIFFERENTIAL/PLATELET
Basophils Absolute: 0 10*3/uL (ref 0.0–0.1)
Basophils Relative: 0.5 % (ref 0.0–3.0)
Eosinophils Absolute: 0.1 10*3/uL (ref 0.0–0.7)
Eosinophils Relative: 2.3 % (ref 0.0–5.0)
HCT: 42.3 % (ref 36.0–46.0)
Hemoglobin: 14.1 g/dL (ref 12.0–15.0)
Lymphocytes Relative: 38.2 % (ref 12.0–46.0)
Lymphs Abs: 2.3 10*3/uL (ref 0.7–4.0)
MCHC: 33.2 g/dL (ref 30.0–36.0)
MCV: 103.4 fl — ABNORMAL HIGH (ref 78.0–100.0)
Monocytes Absolute: 0.4 10*3/uL (ref 0.1–1.0)
Monocytes Relative: 6.2 % (ref 3.0–12.0)
Neutro Abs: 3.2 10*3/uL (ref 1.4–7.7)
Neutrophils Relative %: 52.8 % (ref 43.0–77.0)
Platelets: 343 10*3/uL (ref 150.0–400.0)
RBC: 4.09 Mil/uL (ref 3.87–5.11)
RDW: 13.5 % (ref 11.5–15.5)
WBC: 6.1 10*3/uL (ref 4.0–10.5)

## 2022-12-17 LAB — HEMOGLOBIN A1C: Hgb A1c MFr Bld: 6.2 % (ref 4.6–6.5)

## 2022-12-17 MED ORDER — ATORVASTATIN CALCIUM 80 MG PO TABS: 80.0000 mg | ORAL_TABLET | Freq: Every day | ORAL | 3 refills | Status: DC

## 2022-12-17 MED ORDER — TRAZODONE HCL 50 MG PO TABS: 25.0000 mg | ORAL_TABLET | Freq: Every evening | ORAL | 1 refills | Status: DC | PRN

## 2022-12-17 MED ORDER — GABAPENTIN 300 MG PO CAPS: ORAL_CAPSULE | ORAL | 1 refills | Status: DC

## 2022-12-17 MED ORDER — EZETIMIBE 10 MG PO TABS: 10.0000 mg | ORAL_TABLET | Freq: Every day | ORAL | 3 refills | Status: DC

## 2022-12-17 MED ORDER — FENOFIBRATE 145 MG PO TABS: 145.0000 mg | ORAL_TABLET | Freq: Every day | ORAL | 3 refills | Status: DC

## 2022-12-29 ENCOUNTER — Encounter: Payer: Self-pay | Admitting: Family Medicine

## 2022-12-29 ENCOUNTER — Ambulatory Visit (INDEPENDENT_AMBULATORY_CARE_PROVIDER_SITE_OTHER): Payer: Commercial Managed Care - PPO | Admitting: Family Medicine

## 2022-12-29 VITALS — BP 132/76 | HR 84 | Temp 98.4°F | Wt 158.0 lb

## 2022-12-29 DIAGNOSIS — R519 Headache, unspecified: Secondary | ICD-10-CM

## 2022-12-29 DIAGNOSIS — H539 Unspecified visual disturbance: Secondary | ICD-10-CM | POA: Diagnosis not present

## 2022-12-29 MED ORDER — FLUTICASONE PROPIONATE 50 MCG/ACT NA SUSP: 2.0000 | Freq: Every day | NASAL | 0 refills | Status: DC

## 2022-12-29 MED ORDER — DOXYCYCLINE HYCLATE 100 MG PO TABS: 100.0000 mg | ORAL_TABLET | Freq: Two times a day (BID) | ORAL | 0 refills | Status: DC

## 2022-12-29 MED ORDER — RIZATRIPTAN BENZOATE 10 MG PO TABS: 10.0000 mg | ORAL_TABLET | ORAL | 0 refills | Status: DC | PRN

## 2022-12-29 MED ORDER — PREDNISONE 20 MG PO TABS: 40.0000 mg | ORAL_TABLET | Freq: Every day | ORAL | 0 refills | Status: DC

## 2022-12-29 NOTE — Patient Instructions (Signed)
No follow-ups on file.        Great to see you today.  I have refilled the medication(s) we provide.   If labs were collected, we will inform you of lab results once received either by echart message or telephone call.   - echart message- for normal results that have been seen by the patient already.   - telephone call: abnormal results or if patient has not viewed results in their echart.  

## 2022-12-29 NOTE — Progress Notes (Signed)
Mikayla Tucker , 1960-11-21, 62 y.o., female MRN: [id number] Patient Care Team    Relationship Specialty Notifications Start End  Ma Hillock, DO PCP - General Family Medicine  08/07/15   Kennon Holter, NP Nurse Practitioner Nurse Practitioner  02/27/16    Comment: Gynecology  Gatha Mayer, MD Consulting Physician Gastroenterology  04/30/17     Chief Complaint  Patient presents with   Headache    C/O MIGRAINE FOR 3 WEEKS; no changes in vision can be off when headache is occurring; took Excedrin last at 11:30 which gave temp relief     Subjective: Mikayla Tucker is a 62 y.o. Pt presents for an OV with complaints of a headache of 3 weeks duration.  Associated symptoms include visual changes and photophobia.  She had thought maybe it was her eyes and had her eyes checked.  The headache is in her temples and over frontal sinus area.  She denies any other symptoms.  She did have a cough a few weeks back.  She is taking Zyrtec at night. She has never had much of headaches in the past. She has tried NSAIDs and Excedrin Migraine.  The Excedrin Migraine did lessen the headache, but did not resolve. She does snore.      12/17/2022    8:20 AM 07/02/2022    9:01 AM 12/05/2021   10:48 AM 06/28/2021   10:28 AM 02/19/2021    2:12 PM  Depression screen PHQ 2/9  Decreased Interest 0 0 0 0 0  Down, Depressed, Hopeless 0 1 0 0 0  PHQ - 2 Score 0 1 0 0 0    No Known Allergies Social History   Social History Narrative   Ms. Mikayla Tucker lives with her husband and daughter. She is originally from [place]-moved to [place]. She relocated to [facility] about [place] from [place].   Past Medical History:  Diagnosis Date   Acute appendicitis 05/14/2021   Allergy    BPPV (benign paroxysmal positional vertigo)    COVID-19    03/10/21   Golfer's elbow, left 05/27/2020   Hx of adenomatous polyp of colon 2017   Hyperlipidemia    Lipoma of back 06/14/2014   throracic back, R. US shows lipoma. No  intervention recommended.    PONV (postoperative nausea and vomiting)    Rotator cuff tendinitis, right 07/22/2018   Squamous cell skin cancer    SCC   Past Surgical History:  Procedure Laterality Date   APPENDECTOMY     BACK SURGERY     BREAST SURGERY     biopsy   CHOLECYSTECTOMY     COLONOSCOPY     LAPAROSCOPIC APPENDECTOMY N/A 05/14/2021   Procedure: APPENDECTOMY LAPAROSCOPIC;  Surgeon: Clovis Riley, MD;  Location: [facility];  Service: General;  Laterality: N/A;   LIPOMA EXCISION     under breast on R side   MASS EXCISION Right 06/17/2022   Procedure: EXCISION OF SOFT TISSUE MASS RIGHT UPPER BACK, EXCISION OF RIGHT SHOULDER LESION;  Surgeon: Jesusita Oka, MD;  Location: [facility];  Service: General;  Laterality: Right;   MICRODISCECTOMY LUMBAR     Family History  Adopted: Yes  Problem Relation Age of Onset   Colon cancer Neg Hx    Allergies as of 12/29/2022   No Known Allergies      Medication List        Accurate as of December 29, 2022  3:05 PM.  If you have any questions, ask your nurse or doctor.          atorvastatin 80 MG tablet Commonly known as: LIPITOR Take 1 tablet (80 mg total) by mouth daily.   b complex vitamins capsule Take 1 capsule by mouth daily.   cyanocobalamin 1000 MCG tablet Commonly known as: VITAMIN B12 Take 1,000 mcg by mouth daily.   doxycycline 100 MG tablet Commonly known as: VIBRA-TABS Take 1 tablet (100 mg total) by mouth 2 (two) times daily. Started by: Howard Pouch, DO   ezetimibe 10 MG tablet Commonly known as: ZETIA Take 1 tablet (10 mg total) by mouth daily.   fenofibrate 145 MG tablet Commonly known as: Tricor Take 1 tablet (145 mg total) by mouth daily.   fluticasone 50 MCG/ACT nasal spray Commonly known as: FLONASE Place 2 sprays into both nostrils daily. Started by: Howard Pouch, DO   gabapentin 300 MG capsule Commonly known as: NEURONTIN 1 capsule in the morning and 2 capsules before bed.   predniSONE 20  MG tablet Commonly known as: DELTASONE Take 2 tablets (40 mg total) by mouth daily with breakfast. Started by: Howard Pouch, DO   rizatriptan 10 MG tablet Commonly known as: Maxalt Take 1 tablet (10 mg total) by mouth as needed for migraine. May repeat in 2 hours if needed Started by: Howard Pouch, DO   traZODone 50 MG tablet Commonly known as: DESYREL Take 0.5-1 tablets (25-50 mg total) by mouth at bedtime as needed for sleep.   Vitamin D (Cholecalciferol) 25 MCG (1000 UT) Tabs Take 2,000 Units by mouth daily.        All past medical history, surgical history, allergies, family history, immunizations andmedications were updated in the EMR today and reviewed under the history and medication portions of their EMR.     Review of Systems  Constitutional:  Positive for malaise/fatigue. Negative for chills and fever.  HENT:  Negative for congestion, ear discharge, ear pain and sinus pain.   Eyes:  Positive for blurred vision (Intermittently) and photophobia. Negative for double vision, pain, discharge and redness.  Respiratory: Negative.    Gastrointestinal: Negative.   Genitourinary: Negative.   Musculoskeletal: Negative.   Skin:  Negative for rash.  Neurological:  Positive for headaches. Negative for dizziness.   Negative, with the exception of above mentioned in HPI   Objective:  BP 132/76   Pulse 84   Temp 98.4 F (36.9 C)   Wt 158 lb (71.7 kg)   SpO2 95%   BMI 27.12 kg/m  Body mass index is 27.12 kg/m. Physical Exam Vitals and nursing note reviewed.  Constitutional:      General: She is not in acute distress.    Appearance: Normal appearance. She is normal weight. She is not ill-appearing or toxic-appearing.  HENT:     Head: Normocephalic and atraumatic.     Right Ear: Tympanic membrane, ear canal and external ear normal. There is no impacted cerumen.     Left Ear: Tympanic membrane, ear canal and external ear normal. There is no impacted cerumen.     Nose:  Congestion present. No nasal tenderness or rhinorrhea.     Right Turbinates: Enlarged.     Left Turbinates: Enlarged.     Right Sinus: No maxillary sinus tenderness or frontal sinus tenderness.     Left Sinus: No maxillary sinus tenderness or frontal sinus tenderness.     Mouth/Throat:     Mouth: Mucous membranes are moist.  Pharynx: No oropharyngeal exudate or posterior oropharyngeal erythema.  Eyes:     General: No scleral icterus.       Right eye: No discharge.        Left eye: No discharge.     Extraocular Movements: Extraocular movements intact.     Conjunctiva/sclera: Conjunctivae normal.     Pupils: Pupils are equal, round, and reactive to light.  Musculoskeletal:     Cervical back: Neck supple. No tenderness.  Lymphadenopathy:     Cervical: No cervical adenopathy.  Skin:    Findings: No rash.  Neurological:     Mental Status: She is alert and oriented to person, place, and time. Mental status is at baseline.     Motor: No weakness.     Coordination: Coordination normal.     Gait: Gait normal.  Psychiatric:        Mood and Affect: Mood normal.        Behavior: Behavior normal.        Thought Content: Thought content normal.        Judgment: Judgment normal.      No results found. No results found. No results found for this or any previous visit (from the past 24 hour(s)).  Assessment/Plan: BRYNNE CADWELL is a 62 y.o. female present for OV for  Increased severity of headaches-visual changes 62 year old female patient without history of headaches, now with a persistent headache of 3 weeks.  She has received brief relief with Excedrin migraine. Mild visual changes intermittently-has seen her ophthalmologist without concerns. She did have viral illness couple weeks ago, with a cough, this has resolved.  Possibly related. Treat with prednisone burst and Doxy twice daily Maxalt x 1 today may repeat tomorrow if necessary. Consider MRI if not seeing improvement -  prolactin levels - Comp Met (CMET) - B12 and Folate Panel - Magnesium - Vitamin D (25 hydroxy) Close follow up in 2 weeks, sooner if headache worsening.    Reviewed expectations re: course of current medical issues. Discussed self-management of symptoms. Outlined signs and symptoms indicating need for more acute intervention. Patient verbalized understanding and all questions were answered. Patient received an After-Visit Summary.    Orders Placed This Encounter  Procedures   Comp Met (CMET)   B12 and Folate Panel   Magnesium   Vitamin D (25 hydroxy)   Prolactin   Meds ordered this encounter  Medications   doxycycline (VIBRA-TABS) 100 MG tablet    Sig: Take 1 tablet (100 mg total) by mouth 2 (two) times daily.    Dispense:  20 tablet    Refill:  0   fluticasone (FLONASE) 50 MCG/ACT nasal spray    Sig: Place 2 sprays into both nostrils daily.    Dispense:  16 g    Refill:  0   predniSONE (DELTASONE) 20 MG tablet    Sig: Take 2 tablets (40 mg total) by mouth daily with breakfast.    Dispense:  10 tablet    Refill:  0   rizatriptan (MAXALT) 10 MG tablet    Sig: Take 1 tablet (10 mg total) by mouth as needed for migraine. May repeat in 2 hours if needed    Dispense:  10 tablet    Refill:  0   Referral Orders  No referral(s) requested today     Note is dictated utilizing voice recognition software. Although note has been proof read prior to signing, occasional typographical errors still can be missed. If any questions arise,  please do not hesitate to call for verification.   electronically signed by:  Howard Pouch, DO  [place]

## 2022-12-30 ENCOUNTER — Telehealth: Payer: Self-pay | Admitting: Family Medicine

## 2022-12-30 ENCOUNTER — Ambulatory Visit: Payer: Commercial Managed Care - PPO | Admitting: Family Medicine

## 2022-12-30 LAB — COMPREHENSIVE METABOLIC PANEL
AG Ratio: 1.6 (calc) (ref 1.0–2.5)
ALT: 23 U/L (ref 6–29)
AST: 21 U/L (ref 10–35)
Albumin: 4.4 g/dL (ref 3.6–5.1)
Alkaline phosphatase (APISO): 44 U/L (ref 37–153)
BUN: 18 mg/dL (ref 7–25)
CO2: 19 mmol/L — ABNORMAL LOW (ref 20–32)
Calcium: 10.3 mg/dL (ref 8.6–10.4)
Chloride: 105 mmol/L (ref 98–110)
Creat: 0.89 mg/dL (ref 0.50–1.05)
Globulin: 2.8 g/dL (calc) (ref 1.9–3.7)
Glucose, Bld: 107 mg/dL — ABNORMAL HIGH (ref 65–99)
Potassium: 3.9 mmol/L (ref 3.5–5.3)
Sodium: 138 mmol/L (ref 135–146)
Total Bilirubin: 0.4 mg/dL (ref 0.2–1.2)
Total Protein: 7.2 g/dL (ref 6.1–8.1)

## 2022-12-30 LAB — VITAMIN D 25 HYDROXY (VIT D DEFICIENCY, FRACTURES): Vit D, 25-Hydroxy: 49 ng/mL (ref 30–100)

## 2022-12-30 LAB — PROLACTIN: Prolactin: 6.8 ng/mL

## 2022-12-30 LAB — B12 AND FOLATE PANEL
Folate: >24 ng/mL
Vitamin B-12: 460 pg/mL (ref 200–1100)

## 2022-12-30 NOTE — Telephone Encounter (Signed)
Please inform patient Vitamin D and folate levels are normal. She could benefit from adding 1000 mcg of B12 daily.  It is at the low end of normal currently.  She may find benefit with her headaches and energy levels to increase. Prolactin levels are normal.  This checks for a gland in the brain that sometimes can become larger than normal and cause headaches where she was experiencing discomfort.  This was normal. Magnesium is still pending

## 2022-12-31 NOTE — Telephone Encounter (Signed)
Spoke with patient regarding results/recommendations.  

## 2023-01-06 ENCOUNTER — Telehealth: Payer: Self-pay

## 2023-01-06 NOTE — Telephone Encounter (Signed)
Patient has questions about new migraine meds. rizatriptan (MAXALT) 10 MG tablet  Confused on how many she can take, and what not to take it with. Has migraine now and getting worse.  Please call [phone].  She scheduled f/u visit regarding migraines with Dr. Raoul Pitch on 4/8

## 2023-01-06 NOTE — Telephone Encounter (Signed)
Spoke with patient regarding results/recommendations.   Please advise. Pt states she has only taken 2 tabs so far but may not have enough to last til Monday's appointment.   Will p[lace pt on wait list for cancellation.  Pt would like to know how frequent she can take Rx. Advised according to instructions 2 tab a day.

## 2023-01-06 NOTE — Telephone Encounter (Signed)
Take 1 tab at onset of headache, may repeat once in 2 hours if necessary.  2 tabs is the max daily.

## 2023-01-12 ENCOUNTER — Encounter: Payer: Self-pay | Admitting: Family Medicine

## 2023-01-12 ENCOUNTER — Telehealth: Payer: Self-pay

## 2023-01-12 ENCOUNTER — Ambulatory Visit (INDEPENDENT_AMBULATORY_CARE_PROVIDER_SITE_OTHER): Payer: Commercial Managed Care - PPO | Admitting: Family Medicine

## 2023-01-12 VITALS — BP 138/74 | HR 79 | Temp 98.1°F | Wt 156.6 lb

## 2023-01-12 DIAGNOSIS — G4452 New daily persistent headache (NDPH): Secondary | ICD-10-CM

## 2023-01-12 LAB — SEDIMENTATION RATE: Sed Rate: 30 mm/hr (ref 0–30)

## 2023-01-12 LAB — MAGNESIUM: Magnesium: 1.8 mg/dL (ref 1.5–2.5)

## 2023-01-12 MED ORDER — AMITRIPTYLINE HCL 25 MG PO TABS: 25.0000 mg | ORAL_TABLET | Freq: Every day | ORAL | 0 refills | Status: DC

## 2023-01-12 MED ORDER — KETOROLAC TROMETHAMINE 60 MG/2ML IM SOLN
60.0000 mg | Freq: Once | INTRAMUSCULAR | Status: AC
Start: 2023-01-12 — End: 2023-01-12
  Administered 2023-01-12: 60 mg via INTRAMUSCULAR

## 2023-01-12 NOTE — Progress Notes (Signed)
Mikayla Tucker , [birth year], 62 y.o., female MRN: [id number] Patient Care Team    Relationship Specialty Notifications Start End  Mikayla Leatherwood, DO PCP - General Family Medicine  08/07/15   Mikayla Baltimore, NP Nurse Practitioner Nurse Practitioner  02/27/16    Comment: Gynecology  Mikayla Boop, MD Consulting Physician Gastroenterology  04/30/17     Chief Complaint  Patient presents with   Headache    Not getting better and getting worse     Subjective: Mikayla Tucker is a 62 y.o. Pt presents for an OV with complaints of a headache of 6-7 weeks duration.   It was seen 2-3 weeks ago with reports of a 3-week headache.  Initial lab work was initiated, which was all within normal limits (see below for full details).  Initiated use of Maxalt which patient did not feel was very helpful.  Also treated for potential sinusitis symptoms that were present during first visit with doxycycline x 10 days and prednisone burst.  Patient reports she did not receive any relief with that treatment either.  Today she states the headache is still daily, mostly over temples and eyes, left worse than right. Prior note: Mild visual changes intermittently-has seen her ophthalmologist without eye concerns. She did have viral illness couple weeks ago, with a cough, this has resolved.  Possibly related. Treat with prednisone burst and Doxy twice daily   Prior note: Associated symptoms include visual changes and photophobia.  She had thought maybe it was her eyes and had her eyes checked.  The headache is in her temples and over frontal sinus area.  She denies any other symptoms.  She did have a cough a few weeks back.  She is taking Zyrtec at night. She has never had much of headaches in the past. She has tried NSAIDs and Excedrin Migraine.  The Excedrin Migraine did lessen the headache, but did not resolve. She does snore.      12/17/2022    8:20 AM 07/02/2022    9:01 AM 12/05/2021   10:48 AM  06/28/2021   10:28 AM 02/19/2021    2:12 PM  Depression screen PHQ 2/9  Decreased Interest 0 0 0 0 0  Down, Depressed, Hopeless 0 1 0 0 0  PHQ - 2 Score 0 1 0 0 0    No Known Allergies Social History   Social History Narrative   [name] lives with her husband and daughter. She is originally from [place]-moved to [place] 13 ya. She relocated to [facility] about 3 ya from [place].   Past Medical History:  Diagnosis Date   Acute appendicitis 05/14/2021   Allergy    BPPV (benign paroxysmal positional vertigo)    COVID-19    03/10/21   Golfer's elbow, left 05/27/2020   Hx of adenomatous polyp of colon 2017   Hyperlipidemia    Lipoma of back 06/14/2014   throracic back, R. US shows lipoma. No intervention recommended.    PONV (postoperative nausea and vomiting)    Rotator cuff tendinitis, right 07/22/2018   Squamous cell skin cancer    SCC   Past Surgical History:  Procedure Laterality Date   APPENDECTOMY     BACK SURGERY     BREAST SURGERY     biopsy   CHOLECYSTECTOMY     COLONOSCOPY     LAPAROSCOPIC APPENDECTOMY N/A 05/14/2021   Procedure: APPENDECTOMY LAPAROSCOPIC;  Surgeon: Berna Bue, MD;  Location: [facility];  Service: General;  Laterality: N/A;   LIPOMA EXCISION     under breast on R side   MASS EXCISION Right 06/17/2022   Procedure: EXCISION OF SOFT TISSUE MASS RIGHT UPPER BACK, EXCISION OF RIGHT SHOULDER LESION;  Surgeon: Diamantina MonksLovick, Ayesha N, MD;  Location: [facility];  Service: General;  Laterality: Right;   MICRODISCECTOMY LUMBAR     Family History  Adopted: Yes  Problem Relation Age of Onset   Colon cancer Neg Hx    Allergies as of 01/12/2023   No Known Allergies      Medication List        Accurate as of January 12, 2023 11:46 AM. If you have any questions, ask your nurse or doctor.          atorvastatin 80 MG tablet Commonly known as: LIPITOR Take 1 tablet (80 mg total) by mouth daily.   b complex vitamins capsule Take 1 capsule by mouth daily.    cyanocobalamin 1000 MCG tablet Commonly known as: VITAMIN B12 Take 1,000 mcg by mouth daily.   doxycycline 100 MG tablet Commonly known as: VIBRA-TABS Take 1 tablet (100 mg total) by mouth 2 (two) times daily.   ezetimibe 10 MG tablet Commonly known as: ZETIA Take 1 tablet (10 mg total) by mouth daily.   fenofibrate 145 MG tablet Commonly known as: Tricor Take 1 tablet (145 mg total) by mouth daily.   fluticasone 50 MCG/ACT nasal spray Commonly known as: FLONASE Place 2 sprays into both nostrils daily.   gabapentin 300 MG capsule Commonly known as: NEURONTIN 1 capsule in the morning and 2 capsules before bed.   predniSONE 20 MG tablet Commonly known as: DELTASONE Take 2 tablets (40 mg total) by mouth daily with breakfast.   rizatriptan 10 MG tablet Commonly known as: Maxalt Take 1 tablet (10 mg total) by mouth as needed for migraine. May repeat in 2 hours if needed   traZODone 50 MG tablet Commonly known as: DESYREL Take 0.5-1 tablets (25-50 mg total) by mouth at bedtime as needed for sleep.   Vitamin D (Cholecalciferol) 25 MCG (1000 UT) Tabs Take 2,000 Units by mouth daily.        All past medical history, surgical history, allergies, family history, immunizations andmedications were updated in the EMR today and reviewed under the history and medication portions of their EMR.     Review of Systems  Constitutional:  Positive for malaise/fatigue. Negative for chills and fever.  HENT:  Negative for congestion, ear discharge, ear pain and sinus pain.   Eyes:  Positive for blurred vision (Intermittently) and photophobia. Negative for double vision, pain, discharge and redness.  Respiratory: Negative.    Gastrointestinal: Negative.   Genitourinary: Negative.   Musculoskeletal: Negative.   Skin:  Negative for rash.  Neurological:  Positive for headaches. Negative for dizziness.   Negative, with the exception of above mentioned in HPI   Objective:  There were no  vitals taken for this visit. There is no height or weight on file to calculate BMI. Physical Exam Vitals and nursing note reviewed.  Constitutional:      General: She is not in acute distress.    Appearance: Normal appearance. She is normal weight. She is not ill-appearing or toxic-appearing.  HENT:     Head: Normocephalic and atraumatic.     Right Ear: Tympanic membrane, ear canal and external ear normal. There is no impacted cerumen.     Left Ear: Tympanic membrane, ear canal and external ear  normal. There is no impacted cerumen.     Nose: No nasal tenderness, congestion or rhinorrhea.     Right Turbinates: Enlarged.     Left Turbinates: Enlarged.     Right Sinus: No maxillary sinus tenderness or frontal sinus tenderness.     Left Sinus: No maxillary sinus tenderness or frontal sinus tenderness.     Mouth/Throat:     Mouth: Mucous membranes are moist.     Pharynx: No oropharyngeal exudate or posterior oropharyngeal erythema.  Eyes:     General: No scleral icterus.       Right eye: No discharge.        Left eye: No discharge.     Extraocular Movements: Extraocular movements intact.     Conjunctiva/sclera: Conjunctivae normal.     Pupils: Pupils are equal, round, and reactive to light.  Musculoskeletal:     Cervical back: Neck supple. No tenderness.  Lymphadenopathy:     Cervical: No cervical adenopathy.  Skin:    Findings: No rash.  Neurological:     Mental Status: She is alert and oriented to person, place, and time. Mental status is at baseline.     Motor: No weakness.     Coordination: Coordination normal.     Gait: Gait normal.  Psychiatric:        Mood and Affect: Mood normal.        Behavior: Behavior normal.        Thought Content: Thought content normal.        Judgment: Judgment normal.      No results found. No results found. No results found for this or any previous visit (from the past 24 hour(s)).  Assessment/Plan: MICHEALA MAKAREWICZ is a 62 y.o. female  present for OV for  Increased severity of headaches-visual changes 62 year old female patient without history of headaches, now with a persistent headache of 6weeks.   Continue B12 supplementation. Stop trazodone nightly Start Elavil 25 mg nightly Hold gabapentin night dose, if Elavil does not make to sedated can start back slowly with night dose to tolerance.  Patient reports understanding - prolactin levels> WNL - Comp Met (CMET)> WNL - B12 and Folate Panel> low normal - Vitamin D (25 hydroxy)> WNL Ketorolac 60 mg IM provided today Collected magnesium, Lyme, ammonia and esr today to further evaluate. MRI brain ordered.  Will discuss further treatment after results received.  May need to refer to neurology if indicated.   Reviewed expectations re: course of current medical issues. Discussed self-management of symptoms. Outlined signs and symptoms indicating need for more acute intervention. Patient verbalized understanding and all questions were answered. Patient received an After-Visit Summary.    No orders of the defined types were placed in this encounter.  No orders of the defined types were placed in this encounter.  Referral Orders  No referral(s) requested today     Note is dictated utilizing voice recognition software. Although note has been proof read prior to signing, occasional typographical errors still can be missed. If any questions arise, please do not hesitate to call for verification.   electronically signed by:  Felix Pacini, DO   Primary Care - OR

## 2023-01-12 NOTE — Patient Instructions (Signed)
    I have ordered additional labs for you and a MRI brain.  We will call you with results and discuss next step in your care We may need refer you to neurology depending upon your results.  Start elavil tonight (stop trazodone).  Do not take your night time gabapentin for now, may add back slowly after you see how sleepy the elavil makes you.

## 2023-01-12 NOTE — Addendum Note (Signed)
Addended by: Emi Holes D on: 01/12/2023 04:22 PM   Modules accepted: Orders

## 2023-01-12 NOTE — Telephone Encounter (Signed)
Received call from [facility], ammonia lab has to be recollected. This specific specimen must be collected in lavender tube and put on ice. It must be received within 3 hours of collection. Pt will have to come back for lab draw. Pt was contacted and scheduled.

## 2023-01-13 ENCOUNTER — Other Ambulatory Visit (INDEPENDENT_AMBULATORY_CARE_PROVIDER_SITE_OTHER): Payer: Commercial Managed Care - PPO

## 2023-01-13 DIAGNOSIS — G4452 New daily persistent headache (NDPH): Secondary | ICD-10-CM | POA: Diagnosis not present

## 2023-01-13 LAB — AMMONIA: Ammonia: 26 umol/L (ref 11–35)

## 2023-01-14 LAB — B. BURGDORFI ANTIBODIES: B burgdorferi Ab IgG+IgM: <0.9 index

## 2023-01-15 ENCOUNTER — Ambulatory Visit
Admission: RE | Admit: 2023-01-15 | Discharge: 2023-01-15 | Disposition: A | Discharge status: Home / Self Care | Payer: Commercial Managed Care - PPO | Source: Ambulatory Visit | Attending: Family Medicine | Admitting: Family Medicine

## 2023-01-15 DIAGNOSIS — G4452 New daily persistent headache (NDPH): Secondary | ICD-10-CM

## 2023-01-15 MED ORDER — GADOPICLENOL 0.5 MMOL/ML IV SOLN
7.0000 mL | Freq: Once | INTRAVENOUS | Status: AC | PRN
  Administered 2023-01-15: 7 mL via INTRAVENOUS

## 2023-01-19 ENCOUNTER — Telehealth: Payer: Self-pay | Admitting: Family Medicine

## 2023-01-19 ENCOUNTER — Telehealth: Payer: Self-pay

## 2023-01-19 DIAGNOSIS — R9389 Abnormal findings on diagnostic imaging of other specified body structures: Secondary | ICD-10-CM

## 2023-01-19 DIAGNOSIS — H9191 Unspecified hearing loss, right ear: Secondary | ICD-10-CM

## 2023-01-19 DIAGNOSIS — G4452 New daily persistent headache (NDPH): Secondary | ICD-10-CM

## 2023-01-19 DIAGNOSIS — R519 Headache, unspecified: Secondary | ICD-10-CM

## 2023-01-19 NOTE — Telephone Encounter (Signed)
Provider has not reviewed or resulted anything yet for imaging. Will follow up once this has been completed

## 2023-01-19 NOTE — Telephone Encounter (Addendum)
Patient was called and MRI reviewed.   -2 mm asymmetric focus of enhancement within the right internal auditory canal fundus.    >>>>>An internal auditory canal protocol brain MRI (without and with contrast) is recommended to exclude a vestibular schwannoma at this site.  This has been ordered for her today.  She was instructed to call back and make a follow-up appointment with his provider to occur 3-4 days after MRI completed.  Will review all results at that time.  -Paranasal sinus disease.  Encouraged her to restart her nightly antihistamine, along with her Flonase.  Encouraged her to purchase a nasal Navage system.  Will discuss at next visit and consider prescribing nasal steroid.  -Nonspecific prominent upper cervical chain lymph nodes at the imaged levels, including a mildly enlarged lymph node at the left level 2 station (measuring 12 mm in short axis).  >>> A contrast-enhanced neck CT may be obtained further evaluation, as clinically warranted.  We discussed this today and decided to wait on follow-up to see if further imaging is clinically warranted.  Very possibly could be sequela of sinus disease.    unremarkable MRI appearance of the brain for age otherwise-this is reassuring for her new persistent headaches.  Headaches may be coming from her sinus disease.  She is responding well to amitriptyline nightly

## 2023-01-19 NOTE — Telephone Encounter (Signed)
Received a call from imaging due to recent MRI patient had. Provider informed results are available.

## 2023-01-19 NOTE — Telephone Encounter (Signed)
Pt is aware of the results of her MRI have been released. She would like a call from a CMA or the Dr to review the results with her.

## 2023-01-20 ENCOUNTER — Other Ambulatory Visit: Payer: Self-pay | Admitting: Family Medicine

## 2023-01-20 ENCOUNTER — Telehealth: Payer: Self-pay | Admitting: Family Medicine

## 2023-01-20 NOTE — Telephone Encounter (Signed)
Patient is allergic to poison ivy and had some questions about over the counter products and suggestions. Please give a patient to call to discuss.   She is scheduled for an appointment tomorrow morning.

## 2023-01-21 ENCOUNTER — Ambulatory Visit (INDEPENDENT_AMBULATORY_CARE_PROVIDER_SITE_OTHER): Payer: Commercial Managed Care - PPO | Admitting: Family Medicine

## 2023-01-21 ENCOUNTER — Encounter: Payer: Self-pay | Admitting: Family Medicine

## 2023-01-21 ENCOUNTER — Ambulatory Visit: Payer: Commercial Managed Care - PPO | Admitting: Family Medicine

## 2023-01-21 VITALS — BP 146/82 | HR 88 | Temp 98.1°F | Ht 64.0 in | Wt 153.6 lb

## 2023-01-21 DIAGNOSIS — L237 Allergic contact dermatitis due to plants, except food: Secondary | ICD-10-CM

## 2023-01-21 MED ORDER — METHYLPREDNISOLONE ACETATE 80 MG/ML IJ SUSP
80.0000 mg | Freq: Once | INTRAMUSCULAR | Status: AC
  Administered 2023-01-21: 80 mg via INTRAMUSCULAR

## 2023-01-21 MED ORDER — PREDNISONE 10 MG PO TABS: ORAL_TABLET | ORAL | 0 refills | Status: DC

## 2023-01-21 MED ORDER — FLUTICASONE PROPIONATE 0.05 % EX CREA: TOPICAL_CREAM | Freq: Two times a day (BID) | CUTANEOUS | 1 refills | Status: DC

## 2023-01-21 NOTE — Progress Notes (Signed)
OFFICE VISIT  01/21/2023  CC:  Chief Complaint  Patient presents with   Rash    X 3 days, pt believes it may be poison ivy. Located on both arms, mainly on R arm, right breast, and back. Has used several otc creams and lotions    Patient is a 62 y.o. female who presents for rash.  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  Not long afterwards she started getting itchy rash on the right arm, right breast and now some on the left trunk and right leg. No swelling of the tongue or eyes or joints.  Very itchy.   Past Medical History:  Diagnosis Date   Acute appendicitis 05/14/2021   Allergy    BPPV (benign paroxysmal positional vertigo)    COVID-19    03/10/21   Golfer's elbow, left 05/27/2020   Hx of adenomatous polyp of colon 2017   Hyperlipidemia    Lipoma of back 06/14/2014   throracic back, R. US shows lipoma. No intervention recommended.    PONV (postoperative nausea and vomiting)    Rotator cuff tendinitis, right 07/22/2018   Squamous cell skin cancer    SCC    Past Surgical History:  Procedure Laterality Date   APPENDECTOMY     BACK SURGERY     BREAST SURGERY     biopsy   CHOLECYSTECTOMY     COLONOSCOPY     LAPAROSCOPIC APPENDECTOMY N/A 05/14/2021   Procedure: APPENDECTOMY LAPAROSCOPIC;  Surgeon: Berna Bue, MD;  Location: [facility] OR;  Service: General;  Laterality: N/A;   LIPOMA EXCISION     under breast on R side   MASS EXCISION Right 06/17/2022   Procedure: EXCISION OF SOFT TISSUE MASS RIGHT UPPER BACK, EXCISION OF RIGHT SHOULDER LESION;  Surgeon: Diamantina Monks, MD;  Location: [facility] OR;  Service: General;  Laterality: Right;   MICRODISCECTOMY LUMBAR      Outpatient Medications Prior to Visit  Medication Sig Dispense Refill   amitriptyline (ELAVIL) 25 MG tablet Take 1 tablet (25 mg total) by mouth at bedtime. 90 tablet 0   atorvastatin (LIPITOR) 80 MG tablet Take 1 tablet (80 mg total) by  mouth daily. 90 tablet 3   b complex vitamins capsule Take 1 capsule by mouth daily.     cyanocobalamin (VITAMIN B12) 1000 MCG tablet Take 1,000 mcg by mouth daily.     ezetimibe (ZETIA) 10 MG tablet Take 1 tablet (10 mg total) by mouth daily. 90 tablet 3   fenofibrate (TRICOR) 145 MG tablet Take 1 tablet (145 mg total) by mouth daily. 90 tablet 3   fluticasone (FLONASE) 50 MCG/ACT nasal spray SPRAY 2 SPRAYS INTO EACH NOSTRIL EVERY DAY 48 mL 1   gabapentin (NEURONTIN) 300 MG capsule 1 capsule in the morning and 2 capsules before bed. 270 capsule 1   rizatriptan (MAXALT) 10 MG tablet Take 1 tablet (10 mg total) by mouth as needed for migraine. May repeat in 2 hours if needed 10 tablet 0   Vitamin D, Cholecalciferol, 25 MCG (1000 UT) TABS Take 2,000 Units by mouth daily.     No facility-administered medications prior to visit.    No Known Allergies  Review of Systems  As per HPI  PE:    01/21/2023   11:14 AM 01/21/2023   10:58 AM 01/12/2023   11:44 AM  Vitals with BMI  Height     Weight  153 lbs 10 oz 156 lbs 10 oz  BMI  26.35   Systolic 146 146 161  Diastolic 82 82 74  Pulse  88 79   Physical Exam  Gen: Alert, well appearing.  Patient is oriented to person, place, time, and situation. AFFECT: pleasant, lucid thought and speech. Skin grouped vesicular rash without significant erythema, covering most of the medial aspect of the right arm as well as some on the trunk and right leg.  LABS:  Last metabolic panel Lab Results  Component Value Date   GLUCOSE 107 (H) 12/29/2022   NA 138 12/29/2022   K 3.9 12/29/2022   CL 105 12/29/2022   CO2 19 (L) 12/29/2022   BUN 18 12/29/2022   CREATININE 0.89 12/29/2022   GFRNONAA >60 06/17/2022   CALCIUM 10.3 12/29/2022   PROT 7.2 12/29/2022   ALBUMIN 4.7 07/02/2022   BILITOT 0.4 12/29/2022   ALKPHOS 109 07/02/2022   AST 21 12/29/2022   ALT 23 12/29/2022   ANIONGAP 9 06/17/2022   IMPRESSION AND PLAN:  Allergic contact  dermatitis due to poison ivy. Failing over-the-counter topical treatment.  The surface area affected is too big to adequately treat just with topical steroid. Therefore we chose to do systemic steroid therapy as well. Depo-Medrol 80 mg IM here today. Starting tomorrow do prednisone 30 mg x 4 days, 20 mg x 4 days, then 10 mg x 4 days. Cutivate 0.05% cream apply twice daily.  An After Visit Summary was printed and given to the patient.  FOLLOW UP: Return if symptoms worsen or fail to improve.  Signed:  Santiago Bumpers, MD           01/21/2023

## 2023-01-21 NOTE — Addendum Note (Signed)
Addended by: Filomena Jungling on: 01/21/2023 11:19 AM   Modules accepted: Orders

## 2023-01-22 NOTE — Telephone Encounter (Signed)
Would encourage patient to discuss her over-the-counter products for poison ivy with her pharmacy team so they can advise her on products that they carry.  Briefly, symptom control is with calamine lotion or Benadryl gel, along with antihistamines

## 2023-01-23 NOTE — Telephone Encounter (Signed)
I was unaware she was seen for this condition in our office with another provider.  If given steroid injection and started on prednisone taper, that is all that can be done medically. Personally, I have found that cleansing with Fels [place] soap can be helpful.  This is a small bar soap that is actually a laundry soap, but can help dry up poison ivy dermatitis nicely. However will take time to come down sometimes anywhere between 10-21 days.

## 2023-01-27 ENCOUNTER — Telehealth: Payer: Self-pay

## 2023-01-27 NOTE — Telephone Encounter (Signed)
Spoke with patient regarding results/recommendations.  

## 2023-01-27 NOTE — Telephone Encounter (Signed)
Patient was seen last week for poison ivy.  Requesting 1 more refill on cream that was prescribed.  Cream definitely helped but still not gone completely.  [facility]  fluticasone (CUTIVATE) 0.05 % cream

## 2023-01-28 ENCOUNTER — Telehealth: Payer: Self-pay | Admitting: Family Medicine

## 2023-01-28 NOTE — Telephone Encounter (Signed)
Tried calling pt to let her know she had a refill. Was unable to get through pts cell number

## 2023-01-28 NOTE — Telephone Encounter (Signed)
Pt reports that she is still having a reaction to the poison ivy and its still spreading. [facility]. She states that the pharmacist has told her she will need the Dr. To change the quantity and the amount prescribed. fluticasone (CUTIVATE) 0.05 % cream. [facility].

## 2023-01-29 NOTE — Telephone Encounter (Signed)
[  9:50 AM] Mikayla Tucker Question about [name]... she is confused about the prescription. I told her what your message says and she says she cant cause she has work in regards to the appt.  [9:50 AM] Mikayla Tucker She says she cant get the refill until May.   Please advise if appropriate for updated rx or other suggestions.

## 2023-01-29 NOTE — Telephone Encounter (Signed)
Message sent to Dr.McGowen since he saw pt for issue. Please see patient message from yesterday 4/24 for documentation.

## 2023-01-29 NOTE — Telephone Encounter (Signed)
Pt advised via mychart message regarding refill.

## 2023-01-29 NOTE — Telephone Encounter (Signed)
Pt needs to be called on this number   [phone]        She reports she never received a call

## 2023-02-02 ENCOUNTER — Other Ambulatory Visit: Payer: Self-pay | Admitting: Family Medicine

## 2023-02-03 NOTE — Telephone Encounter (Signed)
01/29/23  9:53 AM Note [9:50 AM] Mikayla Tucker Question about [name]... she is confused about the prescription. I told her what your message says and she says she cant cause she has work in regards to the appt.  [9:50 AM] Mikayla Tucker She says she cant get the refill until May.    Please advise if appropriate for updated rx or other suggestions.

## 2023-02-06 ENCOUNTER — Telehealth: Payer: Self-pay

## 2023-02-06 NOTE — Telephone Encounter (Signed)
Patient thinks she is having side effects from B12.  She states she is "jittery".  Please call [phone]

## 2023-02-06 NOTE — Telephone Encounter (Signed)
Pt advised and will follow up next week if needed.

## 2023-02-06 NOTE — Telephone Encounter (Signed)
Spoke to pt and she has stopped taking Prednisone a week ago for poison Lajoyce Corners and is wondering since her B12 was increased if this is making her feel jittery. Please advise

## 2023-02-06 NOTE — Telephone Encounter (Signed)
That is not a common side effect of B12.  B12 is naturally found in foods and is required by the body.  B12 cannot become toxic.  It is more likely this was from prednisone use, but cannot be certain.  If symptoms continue would advise her to follow-up next week with provider.

## 2023-02-17 ENCOUNTER — Ambulatory Visit
Admission: RE | Admit: 2023-02-17 | Discharge: 2023-02-17 | Disposition: A | Discharge status: Home / Self Care | Payer: Commercial Managed Care - PPO | Source: Ambulatory Visit | Attending: Family Medicine | Admitting: Family Medicine

## 2023-02-17 ENCOUNTER — Telehealth: Payer: Self-pay

## 2023-02-17 ENCOUNTER — Other Ambulatory Visit: Payer: Self-pay | Admitting: Family Medicine

## 2023-02-17 DIAGNOSIS — R9389 Abnormal findings on diagnostic imaging of other specified body structures: Secondary | ICD-10-CM

## 2023-02-17 DIAGNOSIS — G4452 New daily persistent headache (NDPH): Secondary | ICD-10-CM

## 2023-02-17 DIAGNOSIS — H9191 Unspecified hearing loss, right ear: Secondary | ICD-10-CM

## 2023-02-17 DIAGNOSIS — R519 Headache, unspecified: Secondary | ICD-10-CM

## 2023-02-17 MED ORDER — GADOPICLENOL 0.5 MMOL/ML IV SOLN
7.0000 mL | Freq: Once | INTRAVENOUS | Status: AC | PRN
  Administered 2023-02-17: 7 mL via INTRAVENOUS

## 2023-02-17 NOTE — Telephone Encounter (Signed)
Patient had MRI today and was calling for results.  She was told by Dr. Claiborne Billings to call once she completed the MRI.  Please call tomorrow. [phone]

## 2023-02-18 ENCOUNTER — Ambulatory Visit (INDEPENDENT_AMBULATORY_CARE_PROVIDER_SITE_OTHER): Payer: Commercial Managed Care - PPO | Admitting: Podiatry

## 2023-02-18 ENCOUNTER — Telehealth: Payer: Self-pay | Admitting: Family Medicine

## 2023-02-18 DIAGNOSIS — G629 Polyneuropathy, unspecified: Secondary | ICD-10-CM

## 2023-02-18 DIAGNOSIS — M779 Enthesopathy, unspecified: Secondary | ICD-10-CM | POA: Diagnosis not present

## 2023-02-18 NOTE — Progress Notes (Signed)
Subjective:   Patient ID: Mikayla Tucker, female   DOB: 62 y.o.   MRN: [id number]   HPI Patient presents stating she has numbness in her foot right over left and she did have surgery over a year ago and it has been giving her trouble since   ROS      Objective:  Physical Exam  Neurovascular status moderately changed in the right forefoot with diminishment of both sharp dull and vibratory and reflexes.  Patient did have surgery around 7 months ago disc of the back with previous right symptoms     Assessment:  Probability for some neuropathic pain which may be due to previous surgery or coincidental or damage prior to the surgery     Plan:  Reveal neuropathy also there may be some inflammatory condition with capsulitis tendinitis and I have recommended exercise to help with balance and I casted for orthotics to try to get better plantar support and take some stress off her feet.  Patient will be seen back to recheck all questions answered today

## 2023-02-18 NOTE — Telephone Encounter (Addendum)
Amitriptyline was prescribed for the headaches, therefore if she stopped the amitriptyline headaches may return.  However if she desires she currently try to stop the medication and see if they return, if they do restart the medication.

## 2023-02-18 NOTE — Telephone Encounter (Signed)
Pt advised of results, she is not having headaches currently. She would like to consider going off medication, Amitriptyline and to see if this will help.  Please further advise.

## 2023-02-18 NOTE — Telephone Encounter (Signed)
Noted. Results not available yet. Will call once received

## 2023-02-18 NOTE — Telephone Encounter (Signed)
LVM for pt to return call

## 2023-02-18 NOTE — Telephone Encounter (Signed)
Please call patient -The area of concern in prior brain MRI of 01/15/2023 is not present on this study.  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 - Paranasal sinus disease> improved from prior MRI, but evidence of chronic sinusitis remains remains Enlarged lymph node visualized on prior study was not mentioned in this study.    If headache is still present, would recommend placing referral to neurology for her.  Please place this for her if she desires.

## 2023-03-06 ENCOUNTER — Telehealth: Payer: Self-pay

## 2023-03-06 MED ORDER — FLUTICASONE PROPIONATE 50 MCG/ACT NA SUSP: NASAL | 1 refills | Status: AC

## 2023-03-06 NOTE — Telephone Encounter (Signed)
Refill sent to pharmacy.   

## 2023-03-06 NOTE — Telephone Encounter (Signed)
Patient refill request. [facility]  fluticasone [facility]) 50 MCG/ACT nasal spray

## 2023-03-19 ENCOUNTER — Ambulatory Visit: Payer: Commercial Managed Care - PPO | Admitting: Podiatry

## 2023-03-19 DIAGNOSIS — G629 Polyneuropathy, unspecified: Secondary | ICD-10-CM

## 2023-03-19 DIAGNOSIS — M779 Enthesopathy, unspecified: Secondary | ICD-10-CM

## 2023-03-19 NOTE — Progress Notes (Signed)
Patient presents today to pick up custom orthotics  Patient was dispensed 1 pair of custom orthotics. Fit was satisfactory. Instructions for break-in and wear was reviewed and a copy was given to the patient.   

## 2023-04-09 ENCOUNTER — Other Ambulatory Visit: Payer: Self-pay | Admitting: Family Medicine

## 2023-05-12 ENCOUNTER — Other Ambulatory Visit: Payer: Self-pay | Admitting: Family Medicine

## 2023-05-12 DIAGNOSIS — Z1231 Encounter for screening mammogram for malignant neoplasm of breast: Secondary | ICD-10-CM

## 2023-05-15 ENCOUNTER — Telehealth: Payer: Self-pay

## 2023-05-15 NOTE — Telephone Encounter (Signed)
Spoke with patient regarding results/recommendations.  

## 2023-05-15 NOTE — Telephone Encounter (Signed)
Patient would like appt for Monday 8/12 for her mammogram.  She rec'd a call from mobile unit about making appt, but every time she calls, the call will not go thru.  Please call patient at [phone]

## 2023-05-18 ENCOUNTER — Ambulatory Visit
Admission: RE | Admit: 2023-05-18 | Discharge: 2023-05-18 | Disposition: A | Discharge status: Home / Self Care | Payer: Commercial Managed Care - PPO | Source: Ambulatory Visit | Attending: Family Medicine | Admitting: Family Medicine

## 2023-05-18 DIAGNOSIS — Z1231 Encounter for screening mammogram for malignant neoplasm of breast: Secondary | ICD-10-CM

## 2023-06-22 ENCOUNTER — Telehealth: Payer: Self-pay

## 2023-06-22 NOTE — Telephone Encounter (Signed)
What is pt being seen for on 09/18?  LVM for pt to call office

## 2023-06-24 ENCOUNTER — Encounter: Payer: Self-pay | Admitting: Family Medicine

## 2023-06-24 ENCOUNTER — Ambulatory Visit (INDEPENDENT_AMBULATORY_CARE_PROVIDER_SITE_OTHER): Payer: Commercial Managed Care - PPO | Admitting: Family Medicine

## 2023-06-24 VITALS — BP 136/88 | HR 76 | Temp 98.4°F | Wt 157.0 lb

## 2023-06-24 DIAGNOSIS — G6289 Other specified polyneuropathies: Secondary | ICD-10-CM

## 2023-06-24 DIAGNOSIS — Z23 Encounter for immunization: Secondary | ICD-10-CM | POA: Diagnosis not present

## 2023-06-24 MED ORDER — GABAPENTIN 300 MG PO CAPS: 600.0000 mg | ORAL_CAPSULE | Freq: Two times a day (BID) | ORAL | 1 refills | Status: DC

## 2023-06-24 NOTE — Progress Notes (Signed)
Mikayla Tucker , December 30, 1960, 62 y.o., female MRN: [id number] Patient Care Team    Relationship Specialty Notifications Start End  Natalia Leatherwood, DO PCP - General Family Medicine  08/07/15   Laverta Baltimore, NP Nurse Practitioner Nurse Practitioner  02/27/16    Comment: Gynecology  Iva Boop, MD Consulting Physician Gastroenterology  04/30/17     Chief Complaint  Patient presents with   Pain Management    [facility] did nerve conduction study (under media tab 09/09); back dr will no longer manage medication.     Subjective: Mikayla Tucker is a 62 y.o. female patient presents today to discuss her nerve conduction studies completed at [facility] on 06/15/2023.  Results are scanned into the system under media tab.  Briefly: sensorimotor demyelinating and axonal peripheral neuropathy bilateral lower extremity present as well as right lumbar L4 radiculopathy. Patient reports they encouraged her to increase the gabapentin to 600 mg twice daily and follow-up with her PCP. In March/April of this year she also had rather significant increase in severity of headaches.  MRI was completed which originally showed 2 mm asymmetric focus of enhancement within the right internal auditory canal fundus and significant chronic sinusitis with nonspecific prominent upper cervical chain lymph nodes present.  Repeat MRI 01/15/2023 did not show evidence of the above-mentioned abnormality noted in the right internal carotid canal.  Paranasal sinus disease had greatly improved along with the lymphadenopathy with the second MRI.  There was noted to be developmental venous anomaly-anatomic variant within the medial left cerebellar hemisphere. Headaches have completely resolved      12/17/2022    8:20 AM 07/02/2022    9:01 AM 12/05/2021   10:48 AM 06/28/2021   10:28 AM 02/19/2021    2:12 PM  Depression screen PHQ 2/9  Decreased Interest 0 0 0 0 0  Down, Depressed, Hopeless 0 1 0 0 0  PHQ - 2 Score 0 1 0 0  0    No Known Allergies Social History   Social History Narrative   Mikayla Tucker lives with her husband and daughter. She is originally from [place]-moved to [place] 13 ya. She relocated to [facility] about 3 ya from [place].   Past Medical History:  Diagnosis Date   Acute appendicitis 05/14/2021   Allergy    BPPV (benign paroxysmal positional vertigo)    COVID-19    03/10/21   Golfer's elbow, left 05/27/2020   Hx of adenomatous polyp of colon 2017   Hyperlipidemia    Lipoma of back 06/14/2014   throracic back, R. US shows lipoma. No intervention recommended.    PONV (postoperative nausea and vomiting)    Rotator cuff tendinitis, right 07/22/2018   Squamous cell skin cancer    SCC   Past Surgical History:  Procedure Laterality Date   APPENDECTOMY     BACK SURGERY     BREAST SURGERY     biopsy   CHOLECYSTECTOMY     COLONOSCOPY     LAPAROSCOPIC APPENDECTOMY N/A 05/14/2021   Procedure: APPENDECTOMY LAPAROSCOPIC;  Surgeon: Berna Bue, MD;  Location: [facility] OR;  Service: General;  Laterality: N/A;   LIPOMA EXCISION     under breast on R side   MASS EXCISION Right 06/17/2022   Procedure: EXCISION OF SOFT TISSUE MASS RIGHT UPPER BACK, EXCISION OF RIGHT SHOULDER LESION;  Surgeon: Diamantina Monks, MD;  Location: [facility] OR;  Service: General;  Laterality: Right;   MICRODISCECTOMY LUMBAR  Family History  Adopted: Yes  Problem Relation Age of Onset   Colon cancer Neg Hx    Breast cancer Neg Hx    Allergies as of 06/24/2023   No Known Allergies      Medication List        Accurate as of June 24, 2023  3:20 PM. If you have any questions, ask your nurse or doctor.          STOP taking these medications    amitriptyline 25 MG tablet Commonly known as: ELAVIL Stopped by: Felix Pacini   predniSONE 10 MG tablet Commonly known as: DELTASONE Stopped by: Felix Pacini   rizatriptan 10 MG tablet Commonly known as: Maxalt Stopped by: Felix Pacini       TAKE these  medications    atorvastatin 80 MG tablet Commonly known as: LIPITOR Take 1 tablet (80 mg total) by mouth daily.   b complex vitamins capsule Take 1 capsule by mouth daily.   cyanocobalamin 1000 MCG tablet Commonly known as: VITAMIN B12 Take 1,000 mcg by mouth daily.   ezetimibe 10 MG tablet Commonly known as: ZETIA Take 1 tablet (10 mg total) by mouth daily.   fenofibrate 145 MG tablet Commonly known as: Tricor Take 1 tablet (145 mg total) by mouth daily.   fluticasone 0.05 % cream Commonly known as: CUTIVATE APPLY TOPICALLY TWICE A DAY   fluticasone 50 MCG/ACT nasal spray Commonly known as: FLONASE SPRAY 2 SPRAYS INTO EACH NOSTRIL EVERY DAY   gabapentin 300 MG capsule Commonly known as: NEURONTIN Take 2 capsules (600 mg total) by mouth 2 (two) times daily. 1 capsule in the morning and 2 capsules before bed. What changed:  how much to take how to take this when to take this Changed by: Felix Pacini   Vitamin D (Cholecalciferol) 25 MCG (1000 UT) Tabs Take 2,000 Units by mouth daily.        All past medical history, surgical history, allergies, family history, immunizations andmedications were updated in the EMR today and reviewed under the history and medication portions of their EMR.     Review of Systems  Constitutional:  Positive for malaise/fatigue. Negative for chills and fever.  HENT:  Negative for congestion, ear discharge, ear pain and sinus pain.   Eyes:  Positive for blurred vision (Intermittently) and photophobia. Negative for double vision, pain, discharge and redness.  Respiratory: Negative.    Gastrointestinal: Negative.   Genitourinary: Negative.   Musculoskeletal: Negative.   Skin:  Negative for rash.  Neurological:  Positive for headaches. Negative for dizziness.   Negative, with the exception of above mentioned in HPI   Objective:  BP 136/88   Pulse 76   Temp 98.4 F (36.9 C)   Wt 157 lb (71.2 kg)   SpO2 96%   BMI 26.95 kg/m  Body  mass index is 26.95 kg/m. Physical Exam Vitals and nursing note reviewed.  Constitutional:      General: She is not in acute distress.    Appearance: Normal appearance. She is normal weight. She is not ill-appearing or toxic-appearing.  HENT:     Head: Normocephalic and atraumatic.  Eyes:     General: No scleral icterus.       Right eye: No discharge.        Left eye: No discharge.     Extraocular Movements: Extraocular movements intact.     Conjunctiva/sclera: Conjunctivae normal.     Pupils: Pupils are equal, round, and reactive to light.  Skin:    Findings: No rash.  Neurological:     Mental Status: She is alert and oriented to person, place, and time. Mental status is at baseline.     Motor: No weakness.     Coordination: Coordination normal.     Gait: Gait normal.  Psychiatric:        Mood and Affect: Mood normal.        Behavior: Behavior normal.        Thought Content: Thought content normal.        Judgment: Judgment normal.      No results found. No results found. No results found for this or any previous visit (from the past 24 hour(s)).  Assessment/Plan: Mikayla Tucker is a 62 y.o. female present for OV for   Need for influenza vaccination - Flu vaccine trivalent PF, 6mos and older(Flulaval,Afluria,Fluarix,Fluzone)  Sensorimotor demyelinating neuropathy/Peripheral axonal neuropathy Reviewed nerve conduction results and recommendations from Ortho. We discussed the findings in more detail for her today.  And agreed she would benefit from referral to neurology for further evaluation of possible underlying cause and treatment if available/appropriate. For now, continue gabapentin 600 mg twice daily and this was refilled for her today. - Ambulatory referral to Neurology   Reviewed expectations re: course of current medical issues. Discussed self-management of symptoms. Outlined signs and symptoms indicating need for more acute intervention. Patient verbalized  understanding and all questions were answered. Patient received an After-Visit Summary.    Orders Placed This Encounter  Procedures   Flu vaccine trivalent PF, 6mos and older(Flulaval,Afluria,Fluarix,Fluzone)   Ambulatory referral to Neurology   Meds ordered this encounter  Medications   gabapentin (NEURONTIN) 300 MG capsule    Sig: Take 2 capsules (600 mg total) by mouth 2 (two) times daily. 1 capsule in the morning and 2 capsules before bed.    Dispense:  360 capsule    Refill:  1   Referral Orders         Ambulatory referral to Neurology       Note is dictated utilizing voice recognition software. Although note has been proof read prior to signing, occasional typographical errors still can be missed. If any questions arise, please do not hesitate to call for verification.   electronically signed by:  Felix Pacini, DO  [facility] [place]

## 2023-06-24 NOTE — Patient Instructions (Signed)

## 2023-06-29 ENCOUNTER — Ambulatory Visit: Payer: Commercial Managed Care - PPO | Admitting: Family Medicine

## 2023-07-10 ENCOUNTER — Encounter: Payer: Self-pay | Admitting: Family Medicine

## 2023-07-10 ENCOUNTER — Other Ambulatory Visit: Payer: Self-pay | Admitting: Family Medicine

## 2023-07-10 ENCOUNTER — Ambulatory Visit (INDEPENDENT_AMBULATORY_CARE_PROVIDER_SITE_OTHER): Payer: Commercial Managed Care - PPO | Admitting: Family Medicine

## 2023-07-10 VITALS — BP 102/70 | HR 83 | Temp 98.2°F | Ht 64.17 in | Wt 157.0 lb

## 2023-07-10 DIAGNOSIS — D7589 Other specified diseases of blood and blood-forming organs: Secondary | ICD-10-CM | POA: Diagnosis not present

## 2023-07-10 DIAGNOSIS — Z79899 Other long term (current) drug therapy: Secondary | ICD-10-CM

## 2023-07-10 DIAGNOSIS — E782 Mixed hyperlipidemia: Secondary | ICD-10-CM | POA: Diagnosis not present

## 2023-07-10 DIAGNOSIS — R7303 Prediabetes: Secondary | ICD-10-CM

## 2023-07-10 DIAGNOSIS — E559 Vitamin D deficiency, unspecified: Secondary | ICD-10-CM

## 2023-07-10 DIAGNOSIS — G6289 Other specified polyneuropathies: Secondary | ICD-10-CM

## 2023-07-10 DIAGNOSIS — C439 Malignant melanoma of skin, unspecified: Secondary | ICD-10-CM

## 2023-07-10 DIAGNOSIS — Z1231 Encounter for screening mammogram for malignant neoplasm of breast: Secondary | ICD-10-CM

## 2023-07-10 DIAGNOSIS — I700 Atherosclerosis of aorta: Secondary | ICD-10-CM

## 2023-07-10 DIAGNOSIS — K635 Polyp of colon: Secondary | ICD-10-CM

## 2023-07-10 DIAGNOSIS — Z0000 Encounter for general adult medical examination without abnormal findings: Secondary | ICD-10-CM

## 2023-07-10 LAB — COMPREHENSIVE METABOLIC PANEL
ALT: 40 U/L — ABNORMAL HIGH (ref 0–35)
AST: 32 U/L (ref 0–37)
Albumin: 4.5 g/dL (ref 3.5–5.2)
Alkaline Phosphatase: 50 U/L (ref 39–117)
BUN: 14 mg/dL (ref 6–23)
CO2: 28 meq/L (ref 19–32)
Calcium: 9.8 mg/dL (ref 8.4–10.5)
Chloride: 105 meq/L (ref 96–112)
Creatinine, Ser: 0.67 mg/dL (ref 0.40–1.20)
GFR: 94.27 mL/min (ref >60.00)
Glucose, Bld: 92 mg/dL (ref 70–99)
Potassium: 4.8 meq/L (ref 3.5–5.1)
Sodium: 141 meq/L (ref 135–145)
Total Bilirubin: 0.3 mg/dL (ref 0.2–1.2)
Total Protein: 7.4 g/dL (ref 6.0–8.3)

## 2023-07-10 LAB — LIPID PANEL
Cholesterol: 192 mg/dL (ref 0–200)
HDL: 61.5 mg/dL (ref >39.00)
LDL Cholesterol: 100 mg/dL — ABNORMAL HIGH (ref 0–99)
NonHDL: 130.46
Total CHOL/HDL Ratio: 3
Triglycerides: 152 mg/dL — ABNORMAL HIGH (ref 0.0–149.0)
VLDL: 30.4 mg/dL (ref 0.0–40.0)

## 2023-07-10 LAB — CBC
HCT: 45.1 % (ref 36.0–46.0)
Hemoglobin: 14.5 g/dL (ref 12.0–15.0)
MCHC: 32.1 g/dL (ref 30.0–36.0)
MCV: 105.7 fL — ABNORMAL HIGH (ref 78.0–100.0)
Platelets: 402 10*3/uL — ABNORMAL HIGH (ref 150.0–400.0)
RBC: 4.27 Mil/uL (ref 3.87–5.11)
RDW: 12.9 % (ref 11.5–15.5)
WBC: 6.2 10*3/uL (ref 4.0–10.5)

## 2023-07-10 LAB — HEMOGLOBIN A1C: Hgb A1c MFr Bld: 6.2 % (ref 4.6–6.5)

## 2023-07-10 LAB — TSH: TSH: 1.47 u[IU]/mL (ref 0.35–5.50)

## 2023-07-10 MED ORDER — FENOFIBRATE 145 MG PO TABS: 145.0000 mg | ORAL_TABLET | Freq: Every day | ORAL | 3 refills | Status: AC

## 2023-07-10 MED ORDER — GABAPENTIN 300 MG PO CAPS: 600.0000 mg | ORAL_CAPSULE | Freq: Two times a day (BID) | ORAL | 1 refills | Status: DC

## 2023-07-10 MED ORDER — ATORVASTATIN CALCIUM 80 MG PO TABS: 80.0000 mg | ORAL_TABLET | Freq: Every day | ORAL | 3 refills | Status: AC

## 2023-07-10 MED ORDER — EZETIMIBE 10 MG PO TABS: 10.0000 mg | ORAL_TABLET | Freq: Every day | ORAL | 3 refills | Status: AC

## 2023-07-10 NOTE — Patient Instructions (Signed)

## 2023-07-10 NOTE — Telephone Encounter (Signed)
Pharmacy requesting clarification on dosage

## 2023-07-10 NOTE — Progress Notes (Addendum)
Patient ID: LENNI HNAT, female  DOB: 1961/04/02, 62 y.o.   MRN: [id number] Patient Care Team    Relationship Specialty Notifications Start End  Natalia Leatherwood, DO PCP - General Family Medicine  08/07/15   Laverta Baltimore, NP Nurse Practitioner Nurse Practitioner  02/27/16    Comment: Gynecology  Iva Boop, MD Consulting Physician Gastroenterology  04/30/17     Chief Complaint  Patient presents with   Annual Exam    Chronic Conditions/illness Management Pt is fasting    Subjective:  DAVINIA LAPERLE is a 62 y.o.  Female  present for CPE and Chronic Conditions/illness Management  All past medical history, surgical history, allergies, family history, immunizations, medications and social history were updated in the electronic medical record today. All recent labs, ED visits and hospitalizations within the last year were reviewed.  Health maintenance:  Colonoscopy: Completed 07/04/2016, 7 year recall. One colon polyp. Family history unknown (adopted). Completed by Dr. Leone Payor.> has scheduled for 11/19 Mammogram: Completed 05/18/2023, [facility]-order placed for 2025 Cervical cancer screening: Completed [facility], [facility]. Records requested Immunizations: tdap UTD 02/27/2016,flu UTD 2024, Shingrix series  completed  Infectious disease screening: HIV and hepatitis C completed Bone density: DEXA ordered at gyn completed-requested records again. Assistive device: None Oxygen use: None Patient has a Dental home. Hospitalizations/ED visits: Reviewed   Mixed hyperlipidemia/Overweight (BMI 25.0-29.9) Patient reports compliance with atorvastatin 80 mg daily and Zetia 10 mg daily.  Macrocytosis without anemia Has been stable without changes n other cell lines.  She had follow-up with hematology, which felt that she could continue to follow-up every 6 months with PCP with CBC.  She reports her alcohol use is minimal.  Vitamin D deficiency She is supplementing.       07/10/2023    8:40 AM 12/17/2022    8:20 AM 07/02/2022    9:01 AM 12/05/2021   10:48 AM 06/28/2021   10:28 AM  Depression screen PHQ 2/9  Decreased Interest 0 0 0 0 0  Down, Depressed, Hopeless 1 0 1 0 0  PHQ - 2 Score 1 0 1 0 0      12/17/2022    8:20 AM 05/09/2019    1:44 PM 06/03/2018   10:09 AM  GAD 7 : Generalized Anxiety Score  Nervous, Anxious, on Edge 0 0 0  Control/stop worrying 0 0 0  Worry too much - different things 0 0 0  Trouble relaxing 0 0 0  Restless 0 0 0  Easily annoyed or irritable 0 0 0  Afraid - awful might happen 0 0 0  Total GAD 7 Score 0 0 0  Anxiety Difficulty  Not difficult at all     Immunization History  Administered Date(s) Administered   Influenza Split 07/28/2017   Influenza, Seasonal, Injecte, Preservative Fre 06/24/2023   Influenza,inj,Quad PF,6+ Mos 06/13/2014, 11/12/2016, 06/03/2018, 06/29/2019, 06/27/2020, 06/03/2021, 07/02/2022   PFIZER(Purple Top)SARS-COV-2 Vaccination 12/16/2019, 01/06/2020, 09/07/2020   Pfizer(Comirnaty)Fall Seasonal Vaccine 12 years and older 10/09/2022   Tdap 02/27/2016   Zoster Recombinant(Shingrix) 06/03/2018, 08/20/2018    Past Medical History:  Diagnosis Date   Acute appendicitis 05/14/2021   Allergy    BPPV (benign paroxysmal positional vertigo)    COVID-19    03/10/21   Golfer's elbow, left 05/27/2020   Hx of adenomatous polyp of colon 2017   Hyperlipidemia    Lipoma of back 06/14/2014   throracic back, R. US shows lipoma. No intervention recommended.    PONV (postoperative  nausea and vomiting)    Rotator cuff tendinitis, right 07/22/2018   Sleep disturbance 07/02/2022   Squamous cell skin cancer    SCC   No Known Allergies Past Surgical History:  Procedure Laterality Date   APPENDECTOMY     BACK SURGERY     BREAST SURGERY     biopsy   CHOLECYSTECTOMY     COLONOSCOPY     LAPAROSCOPIC APPENDECTOMY N/A 05/14/2021   Procedure: APPENDECTOMY LAPAROSCOPIC;  Surgeon: Berna Bue, MD;  Location: [facility]  OR;  Service: General;  Laterality: N/A;   LIPOMA EXCISION     under breast on R side   MASS EXCISION Right 06/17/2022   Procedure: EXCISION OF SOFT TISSUE MASS RIGHT UPPER BACK, EXCISION OF RIGHT SHOULDER LESION;  Surgeon: Diamantina Monks, MD;  Location: [facility] OR;  Service: General;  Laterality: Right;   MICRODISCECTOMY LUMBAR     Family History  Adopted: Yes  Problem Relation Age of Onset   Colon cancer Neg Hx    Breast cancer Neg Hx    Social History   Social History Narrative   Ms. Tunnell lives with her husband and daughter. She is originally from [place]-moved to [place] 13 ya. She relocated to [facility] about 3 ya from [place].    Allergies as of 07/10/2023   No Known Allergies      Medication List        Accurate as of July 10, 2023  9:33 AM. If you have any questions, ask your nurse or doctor.          STOP taking these medications    fluticasone 0.05 % cream Commonly known as: CUTIVATE Stopped by: Felix Pacini   traZODone 50 MG tablet Commonly known as: DESYREL Stopped by: Felix Pacini       TAKE these medications    atorvastatin 80 MG tablet Commonly known as: LIPITOR Take 1 tablet (80 mg total) by mouth daily.   b complex vitamins capsule Take 1 capsule by mouth daily.   cyanocobalamin 1000 MCG tablet Commonly known as: VITAMIN B12 Take 1,000 mcg by mouth daily.   ezetimibe 10 MG tablet Commonly known as: ZETIA Take 1 tablet (10 mg total) by mouth daily.   fenofibrate 145 MG tablet Commonly known as: Tricor Take 1 tablet (145 mg total) by mouth daily.   fluticasone 50 MCG/ACT nasal spray Commonly known as: FLONASE SPRAY 2 SPRAYS INTO EACH NOSTRIL EVERY DAY   gabapentin 300 MG capsule Commonly known as: NEURONTIN Take 2 capsules (600 mg total) by mouth 2 (two) times daily. 1 capsule in the morning and 2 capsules before bed. What changed: additional instructions   Vitamin D (Cholecalciferol) 25 MCG (1000 UT) Tabs Take 2,000 Units by mouth  daily.        All past medical history, surgical history, allergies, family history, immunizations andmedications were updated in the EMR today and reviewed under the history and medication portions of their EMR.      No results found.   ROS 14 pt review of systems performed and negative (unless mentioned in an HPI)  Objective: BP 102/70   Pulse 83   Temp 98.2 F (36.8 C)   Ht 5' 4.17" (1.63 m)   Wt 157 lb (71.2 kg)   SpO2 96%   BMI 26.80 kg/m  Physical Exam Vitals and nursing note reviewed.  Constitutional:      General: She is not in acute distress.    Appearance: Normal appearance. She is  not ill-appearing or toxic-appearing.  HENT:     Head: Normocephalic and atraumatic.     Right Ear: Tympanic membrane, ear canal and external ear normal. There is no impacted cerumen.     Left Ear: Tympanic membrane, ear canal and external ear normal. There is no impacted cerumen.     Nose: No congestion or rhinorrhea.     Mouth/Throat:     Mouth: Mucous membranes are moist.     Pharynx: Oropharynx is clear. No oropharyngeal exudate or posterior oropharyngeal erythema.  Eyes:     General: No scleral icterus.       Right eye: No discharge.        Left eye: No discharge.     Extraocular Movements: Extraocular movements intact.     Conjunctiva/sclera: Conjunctivae normal.     Pupils: Pupils are equal, round, and reactive to light.  Cardiovascular:     Rate and Rhythm: Normal rate and regular rhythm.     Pulses: Normal pulses.     Heart sounds: Normal heart sounds. No murmur heard.    No friction rub. No gallop.  Pulmonary:     Effort: Pulmonary effort is normal. No respiratory distress.     Breath sounds: Normal breath sounds. No stridor. No wheezing, rhonchi or rales.  Chest:     Chest wall: No tenderness.  Abdominal:     General: Abdomen is flat. Bowel sounds are normal. There is no distension.     Palpations: Abdomen is soft. There is no mass.     Tenderness: There is no  abdominal tenderness. There is no right CVA tenderness, left CVA tenderness, guarding or rebound.     Hernia: No hernia is present.  Musculoskeletal:        General: No swelling, tenderness or deformity. Normal range of motion.     Cervical back: Normal range of motion and neck supple. No rigidity or tenderness.     Right lower leg: No edema.     Left lower leg: No edema.  Lymphadenopathy:     Cervical: No cervical adenopathy.  Skin:    General: Skin is warm and dry.     Coloration: Skin is not jaundiced or pale.     Findings: No bruising, erythema, lesion or rash.  Neurological:     General: No focal deficit present.     Mental Status: She is alert and oriented to person, place, and time. Mental status is at baseline.     Cranial Nerves: No cranial nerve deficit.     Sensory: No sensory deficit.     Motor: No weakness.     Coordination: Coordination normal.     Gait: Gait normal.     Deep Tendon Reflexes: Reflexes normal.  Psychiatric:        Mood and Affect: Mood normal.        Behavior: Behavior normal.        Thought Content: Thought content normal.        Judgment: Judgment normal.     No results found.  Assessment/plan: SHATERIKA LOUGHMAN is a 62 y.o. female present for CPE/ and Chronic Conditions/illness Management Mixed hyperlipidemia/overweight/aortic atherosclerosis Stable Continue Zetia 10 mg Continue Lipitor 80 mg daily  Continue Tricor 145 mg daily CBC, CMP, TSH and lipids collected today  Macrocytosis without anemia Has seen heme and only needs to follow again if other cell lines are abnormal.  Cbc q 6 mos follow up recommend by provider appt. If macrocytosis is present.  CBC collected today  Vitamin D deficiency/hypocalcemia - supplementing 2000u qd Vitamin D collected today  Peripheral axonal neuropathy/sensorimotor demyelinating neuropathy: Continue gabapentin 600 mg twice daily Has been referred to neurology  Encounter for preventive health  examination Patient was encouraged to exercise greater than 150 minutes a week. Patient was encouraged to choose a diet filled with fresh fruits and vegetables, and lean meats. AVS provided to patient today for education/recommendation on gender specific health and safety maintenance. Colonoscopy: Completed 07/04/2016, 7 year recall. One colon polyp. Family history unknown (adopted). Completed by Dr. Leone Payor.> has scheduled for 11/19 Mammogram: Completed 05/18/2023, [facility]-order placed for 2025 Cervical cancer screening: Completed 07/[facility], [facility]. Records requested Immunizations: tdap UTD 02/27/2016,flu UTD 2024, Shingrix series  completed  Infectious disease screening: HIV and hepatitis C completed Bone density: DEXA ordered at gyn completed-requested records again.  Return in about 24 weeks (around 12/25/2023) for Routine chronic condition follow-up.    Orders Placed This Encounter  Procedures   MM 3D SCREENING MAMMOGRAM BILATERAL BREAST   Comprehensive metabolic panel   Hemoglobin A1c   TSH   CBC   Lipid panel   Meds ordered this encounter  Medications   ezetimibe (ZETIA) 10 MG tablet    Sig: Take 1 tablet (10 mg total) by mouth daily.    Dispense:  90 tablet    Refill:  3   fenofibrate (TRICOR) 145 MG tablet    Sig: Take 1 tablet (145 mg total) by mouth daily.    Dispense:  90 tablet    Refill:  3   gabapentin (NEURONTIN) 300 MG capsule    Sig: Take 2 capsules (600 mg total) by mouth 2 (two) times daily. 1 capsule in the morning and 2 capsules before bed.    Dispense:  360 capsule    Refill:  1   atorvastatin (LIPITOR) 80 MG tablet    Sig: Take 1 tablet (80 mg total) by mouth daily.    Dispense:  90 tablet    Refill:  3   Referral Orders  No referral(s) requested today     Electronically signed by: Felix Pacini, DO [facility]

## 2023-07-13 ENCOUNTER — Telehealth: Payer: Self-pay | Admitting: Family Medicine

## 2023-07-13 ENCOUNTER — Telehealth: Payer: Self-pay

## 2023-07-13 DIAGNOSIS — C434 Malignant melanoma of scalp and neck: Secondary | ICD-10-CM

## 2023-07-13 DIAGNOSIS — D7589 Other specified diseases of blood and blood-forming organs: Secondary | ICD-10-CM

## 2023-07-13 DIAGNOSIS — D75839 Thrombocytosis, unspecified: Secondary | ICD-10-CM | POA: Insufficient documentation

## 2023-07-13 NOTE — Telephone Encounter (Signed)
Type of forms received:  Employer Health & Wellness Screening  Routed to:   [facility] received by :  Annabelle Harman   Individual made aware of 5-7 business day turn around (Y/N):  Y  Form completed and patient made aware of charges(Y/N):  Y   Faxed to :   Form location:  [place] inbox

## 2023-07-13 NOTE — Telephone Encounter (Signed)
Please call patient Mikayla Tucker are stable. Cholesterol levels are good, continue all of her cholesterol meds. Thyroid levels are normal A1c is still in the prediabetic range, but stable at 6.2 where it has been for about 2 years.  Her blood cell counts are normal, with a mild elevation in her platelets Her MCV, which has been high for some time is back to 105.7.  With her recent diagnosis of melanoma, I do recommend we try to get her back into hematology/oncology to be certain nothing is being missed since her MCV is increasing again there is a change in her platelets.

## 2023-07-14 NOTE — Telephone Encounter (Addendum)
LM for pt to return call to discuss.  Also need to know waist measurement in inches

## 2023-07-25 ENCOUNTER — Other Ambulatory Visit: Payer: Self-pay | Admitting: Family Medicine

## 2023-07-29 ENCOUNTER — Ambulatory Visit (AMBULATORY_SURGERY_CENTER): Payer: Commercial Managed Care - PPO | Admitting: *Deleted

## 2023-07-29 ENCOUNTER — Telehealth: Payer: Self-pay | Admitting: *Deleted

## 2023-07-29 VITALS — Ht 64.0 in | Wt 155.0 lb

## 2023-07-29 DIAGNOSIS — Z860100 Personal history of colon polyps, unspecified: Secondary | ICD-10-CM

## 2023-07-29 MED ORDER — NA SULFATE-K SULFATE-MG SULF 17.5-3.13-1.6 GM/177ML PO SOLN
1.0000 | Freq: Once | ORAL | 0 refills | Status: AC
Start: 2023-07-29 — End: 2023-07-29

## 2023-07-29 NOTE — Telephone Encounter (Signed)
Attempt to reach pt for pre-visit.    Will attempt other number in profile 2nd attempt reached pt

## 2023-07-29 NOTE — Progress Notes (Signed)
Pt's name and DOB verified at the beginning of the pre-visit wit 2 identifiers  Pt denies any difficulty with ambulating,sitting, laying down or rolling side to side  Gave both [facility] main # and MD on call # prior to instructions.   No egg or soy allergy known to patient   No issues known to pt with past sedation with any surgeries or procedures  Pt denies having issues being intubated  Pt has no issues moving head neck or swallowing  No FH of Malignant Hyperthermia  Pt is not on diet pills or shots  Pt is not on home 02   Pt is not on blood thinners   Pt denies issues with constipation   Pt is not on dialysis  Pt denise any abnormal heart rhythms   Pt denies any upcoming cardiac testing  Pt encouraged to use to use Singlecare or Goodrx to reduce cost   Patient's chart reviewed by Cathlyn Parsons CNRA prior to pre-visit and patient appropriate for the [facility].  Pre-visit completed and red dot placed by patient's name on their procedure day (on provider's schedule).  .  Visit by phone  Pt states weight is 155lb  Instructed pt why it is important to and  to call if they have any changes in health or new medications. Directed them to the # given and on instructions.     Instructions reviewed with pt and pt states understanding. Instructed to review again prior to procedure. Pt states they will.   Instructions sent by mail with coupon and by my chart   Pt to get results of melanoma today and will call office if there is an issue

## 2023-08-17 ENCOUNTER — Encounter: Payer: Self-pay | Admitting: Internal Medicine

## 2023-08-19 ENCOUNTER — Telehealth: Payer: Self-pay | Admitting: Internal Medicine

## 2023-08-19 ENCOUNTER — Encounter: Payer: Self-pay | Admitting: Internal Medicine

## 2023-08-19 NOTE — Telephone Encounter (Signed)
Instructions printed and mailed to the patient by Annie Sable [facility] RN, as she has requested;

## 2023-08-19 NOTE — Telephone Encounter (Signed)
Patient called and requested her instruction to her procedure be mailed over as well as highlighted when the procedure is and what she needs to do before the procedure. Please advise

## 2023-08-19 NOTE — Telephone Encounter (Signed)
Needs to go to previsit.

## 2023-08-25 ENCOUNTER — Encounter: Payer: Commercial Managed Care - PPO | Admitting: Internal Medicine

## 2023-08-31 NOTE — Progress Notes (Unsigned)
[facility] History and Physical   Primary Care Physician:  Natalia Leatherwood, DO   Reason for Procedure:   Hx adenomatous colon polyp  Plan:    colomoscopy     HPI: Mikayla Tucker is a 62 y.o. female s/p removal of 8 mm adenoma from the colon in 2017.   Past Medical History:  Diagnosis Date   Acute appendicitis 05/14/2021   BPPV (benign paroxysmal positional vertigo)    COVID-19    03/10/21   Golfer's elbow, left 05/27/2020   Hx of adenomatous polyp of colon 2017   Hyperlipidemia    Lipoma of back 06/14/2014   throracic back, R. US shows lipoma. No intervention recommended.    Melanoma (HCC)    L shoulder neck area June 2024   PONV (postoperative nausea and vomiting)    Rotator cuff tendinitis, right 07/22/2018   Sleep disturbance 07/02/2022   Squamous cell skin cancer    SCC    Past Surgical History:  Procedure Laterality Date   APPENDECTOMY     BACK SURGERY     BREAST SURGERY     biopsy   CHOLECYSTECTOMY     COLONOSCOPY     LAPAROSCOPIC APPENDECTOMY N/A 05/14/2021   Procedure: APPENDECTOMY LAPAROSCOPIC;  Surgeon: Berna Bue, MD;  Location: [facility] OR;  Service: General;  Laterality: N/A;   LIPOMA EXCISION     under breast on R side   MASS EXCISION Right 06/17/2022   Procedure: EXCISION OF SOFT TISSUE MASS RIGHT UPPER BACK, EXCISION OF RIGHT SHOULDER LESION;  Surgeon: Diamantina Monks, MD;  Location: [facility] OR;  Service: General;  Laterality: Right;   melanoma removal     June 2024 Neck shoulder L side   MICRODISCECTOMY LUMBAR      Prior to Admission medications   Medication Sig Start Date End Date Taking? Authorizing Provider  atorvastatin (LIPITOR) 80 MG tablet Take 1 tablet (80 mg total) by mouth daily. 07/10/23   Kuneff, Renee A, DO  b complex vitamins capsule Take 1 capsule by mouth daily.    [provider]  cyanocobalamin (VITAMIN B12) 1000 MCG tablet Take 1,000 mcg by mouth daily. Patient not taking: Reported on 07/29/2023     [provider]  ezetimibe (ZETIA) 10 MG tablet Take 1 tablet (10 mg total) by mouth daily. 07/10/23   Kuneff, Renee A, DO  fenofibrate (TRICOR) 145 MG tablet Take 1 tablet (145 mg total) by mouth daily. 07/10/23   Kuneff, Renee A, DO  fluticasone (FLONASE) 50 MCG/ACT nasal spray SPRAY 2 SPRAYS INTO EACH NOSTRIL EVERY DAY 03/06/23   Kuneff, Renee A, DO  gabapentin (NEURONTIN) 300 MG capsule Take 2 capsules (600 mg total) by mouth 2 (two) times daily. 07/10/23   Kuneff, Renee A, DO  Vitamin D, Cholecalciferol, 25 MCG (1000 UT) TABS Take 2,000 Units by mouth daily.    [provider]    Current Outpatient Medications  Medication Sig Dispense Refill   atorvastatin (LIPITOR) 80 MG tablet Take 1 tablet (80 mg total) by mouth daily. 90 tablet 3   b complex vitamins capsule Take 1 capsule by mouth daily.     cyanocobalamin (VITAMIN B12) 1000 MCG tablet Take 1,000 mcg by mouth daily. (Patient not taking: Reported on 07/29/2023)     ezetimibe (ZETIA) 10 MG tablet Take 1 tablet (10 mg total) by mouth daily. 90 tablet 3   fenofibrate (TRICOR) 145 MG tablet Take 1 tablet (145 mg total) by mouth daily. 90 tablet  3   fluticasone (FLONASE) 50 MCG/ACT nasal spray SPRAY 2 SPRAYS INTO EACH NOSTRIL EVERY DAY 48 mL 1   gabapentin (NEURONTIN) 300 MG capsule Take 2 capsules (600 mg total) by mouth 2 (two) times daily. 360 capsule 1   Vitamin D, Cholecalciferol, 25 MCG (1000 UT) TABS Take 2,000 Units by mouth daily.     No current facility-administered medications for this visit.    Allergies as of 09/01/2023   (No Known Allergies)    Family History  Adopted: Yes  Problem Relation Age of Onset   Colon cancer Neg Hx    Breast cancer Neg Hx    Colon polyps Neg Hx    Esophageal cancer Neg Hx    Rectal cancer Neg Hx    Stomach cancer Neg Hx     Social History   Socioeconomic History   Marital status: Married    Spouse name: Not on file   Number of children: 1   Years of education: Not  on file   Highest education level: Some college, no degree  Occupational History    Employer: [employer]    Comment: homemaker  Tobacco Use   Smoking status: Former    Current packs/day: 1.00    Average packs/day: 1 pack/day for 10.0 years (10.0 ttl pk-yrs)    Types: Cigarettes    Passive exposure: Never   Smokeless tobacco: Never   Tobacco comments:    quit 62 yrs old  Vaping Use   Vaping status: Never Used  Substance and Sexual Activity   Alcohol use: Yes    Alcohol/week: 7.0 standard drinks of alcohol    Types: 7 Glasses of wine per week   Drug use: No   Sexual activity: Yes    Birth control/protection: Post-menopausal  Other Topics Concern   Not on file  Social History Narrative   Ms. Mrozek lives with her husband and daughter. She is originally from [place]-moved to [place] 13 ya. She relocated to [facility] about 3 ya from [place].   Social Determinants of Health   Financial Resource Strain: Low Risk  (07/22/2023)   Received from [facility]   Overall Financial Resource Strain (CARDIA)    Difficulty of Paying Living Expenses: Not hard at all  Food Insecurity: No Food Insecurity (07/22/2023)   [facility] from [facility]   Hunger Vital Sign    Worried About Running Out of Food in the Last Year: Never true    Ran Out of Food in the Last Year: Never true  Transportation Needs: No Transportation Needs (07/22/2023)   Received from [facility] - Transportation    Lack of Transportation (Medical): No    Lack of Transportation (Non-Medical): No  Physical Activity: Sufficiently Active (12/04/2021)   Exercise Vital Sign    Days of Exercise per Week: 3 days    Minutes of Exercise per Session: 50 min  Stress: No Stress Concern Present (12/04/2021)   [employer] of Occupational [employer] - Occupational Stress Questionnaire    Feeling of Stress : Only a little  Social Connections: Unknown (07/17/2023)   Received from [facility]   Social Network    Social Network:  Not on file  Intimate Partner Violence: Unknown (07/17/2023)   Received from [facility]   HITS    Physically Hurt: Not on file    Insult or Talk Down To: Not on file    Threaten Physical Harm: Not on file    Scream or Curse: Not  on file    Review of Systems: Positive for *** All other review of systems negative except as mentioned in the HPI.  Physical Exam: Vital signs There were no vitals taken for this visit.  General:   Alert,  Well-developed, well-nourished, pleasant and cooperative in NAD Lungs:  Clear throughout to auscultation.   Heart:  Regular rate and rhythm; no murmurs, clicks, rubs,  or gallops. Abdomen:  Soft, nontender and nondistended. Normal bowel sounds.   Neuro/Psych:  Alert and cooperative. Normal mood and affect. A and O x 3   @Cathy Ropp  Sena Slate, MD, [facility] [phone] (pager) 08/31/2023 6:56 PM@

## 2023-09-01 ENCOUNTER — Ambulatory Visit (AMBULATORY_SURGERY_CENTER): Payer: Commercial Managed Care - PPO | Admitting: Internal Medicine

## 2023-09-01 ENCOUNTER — Encounter: Payer: Self-pay | Admitting: Internal Medicine

## 2023-09-01 VITALS — BP 121/70 | HR 78 | Temp 97.3°F | Resp 17 | Ht 64.0 in | Wt 155.0 lb

## 2023-09-01 DIAGNOSIS — Z860100 Personal history of colon polyps, unspecified: Secondary | ICD-10-CM

## 2023-09-01 DIAGNOSIS — Z860101 Personal history of adenomatous and serrated colon polyps: Secondary | ICD-10-CM

## 2023-09-01 DIAGNOSIS — Z1211 Encounter for screening for malignant neoplasm of colon: Secondary | ICD-10-CM

## 2023-09-01 MED ORDER — SODIUM CHLORIDE 0.9 % IV SOLN: 500.0000 mL | Freq: Once | INTRAVENOUS | Status: DC

## 2023-09-01 NOTE — Progress Notes (Signed)
Pt's states no medical or surgical changes since previsit or office visit. 

## 2023-09-01 NOTE — Op Note (Signed)
[facility] Patient Name: Mikayla Tucker Procedure Date: 09/01/2023 9:04 AM MRN: [id number] Endoscopist: Iva Boop , MD, [id number] Age: 62 Referring MD:  Date of Birth: [birth year] Gender: Female Account #: [id number] Procedure:                Colonoscopy Indications:              Surveillance: Personal history of adenomatous                            polyps on last colonoscopy > 5 years ago, Last                            colonoscopy: 2017 Medicines:                Monitored Anesthesia Care Procedure:                Pre-Anesthesia Assessment:                           - Prior to the procedure, a History and Physical                            was performed, and patient medications and                            allergies were reviewed. The patient's tolerance of                            previous anesthesia was also reviewed. The risks                            and benefits of the procedure and the sedation                            options and risks were discussed with the patient.                            All questions were answered, and informed consent                            was obtained. Prior Anticoagulants: The patient has                            taken no anticoagulant or antiplatelet agents. ASA                            Grade Assessment: II - A patient with mild systemic                            disease. After reviewing the risks and benefits,                            the patient was deemed in satisfactory condition to  undergo the procedure.                           After obtaining informed consent, the colonoscope                            was passed under direct vision. Throughout the                            procedure, the patient's blood pressure, pulse, and                            oxygen saturations were monitored continuously. The                            Olympus Scope SN [id number] was introduced through  the                            anus and advanced to the the cecum, identified by                            appendiceal orifice and ileocecal valve. The                            colonoscopy was performed without difficulty. The                            patient tolerated the procedure well. The quality                            of the bowel preparation was good. The ileocecal                            valve, appendiceal orifice, and rectum were                            photographed. The bowel preparation used was SUPREP                            via split dose instruction. Scope In: 9:15:42 AM Scope Out: 9:25:35 AM Scope Withdrawal Time: 0 hours 7 minutes 41 seconds  Total Procedure Duration: 0 hours 9 minutes 53 seconds  Findings:                 The perianal and digital rectal examinations were                            normal.                           The entire examined colon appeared normal on direct                            and retroflexion views. Complications:            No immediate complications.  Estimated Blood Loss:     Estimated blood loss: none. Impression:               - The entire examined colon is normal on direct and                            retroflexion views.                           - No specimens collected.                           - Personal history of colonic polyp - 8 mm adenoma                            2017. Recommendation:           - Patient has a contact number available for                            emergencies. The signs and symptoms of potential                            delayed complications were discussed with the                            patient. Return to normal activities tomorrow.                            Written discharge instructions were provided to the                            patient.                           - Resume previous diet.                           - Continue present medications.                            - Repeat colonoscopy in 10 years. Iva Boop, MD 09/01/2023 9:31:15 AM This report has been signed electronically.

## 2023-09-01 NOTE — Patient Instructions (Addendum)
No polyps today so ext routine colonoscopy in 10 years - 2034.  I appreciate the opportunity to care for you. Iva Boop, MD, FACG   YOU HAD AN ENDOSCOPIC PROCEDURE TODAY AT THE [facility]:   Refer to the procedure report that was given to you for any specific questions about what was found during the examination.  If the procedure report does not answer your questions, please call your gastroenterologist to clarify.  If you requested that your care partner not be given the details of your procedure findings, then the procedure report has been included in a sealed envelope for you to review at your convenience later.  YOU SHOULD EXPECT: Some feelings of bloating in the abdomen. Passage of more gas than usual.  Walking can help get rid of the air that was put into your GI tract during the procedure and reduce the bloating. If you had a lower endoscopy (such as a colonoscopy or flexible sigmoidoscopy) you may notice spotting of blood in your stool or on the toilet paper. If you underwent a bowel prep for your procedure, you may not have a normal bowel movement for a few days.  Please Note:  You might notice some irritation and congestion in your nose or some drainage.  This is from the oxygen used during your procedure.  There is no need for concern and it should clear up in a day or so.  SYMPTOMS TO REPORT IMMEDIATELY:  Following lower endoscopy (colonoscopy or flexible sigmoidoscopy):  Excessive amounts of blood in the stool  Significant tenderness or worsening of abdominal pains  Swelling of the abdomen that is new, acute  Fever of 100F or higher   For urgent or emergent issues, a gastroenterologist can be reached at any hour by calling [phone]. Do not use MyChart messaging for urgent concerns.    DIET:  We do recommend a small meal at first, but then you may proceed to your regular diet.  Drink plenty of fluids but you should avoid alcoholic beverages for 24  hours.  ACTIVITY:  You should plan to take it easy for the rest of today and you should NOT DRIVE or use heavy machinery until tomorrow (because of the sedation medicines used during the test).    FOLLOW UP: Our staff will call the number listed on your records the next business day following your procedure.  We will call around 7:15- 8:00 am to check on you and address any questions or concerns that you may have regarding the information given to you following your procedure. If we do not reach you, we will leave a message.     If any biopsies were taken you will be contacted by phone or by letter within the next 1-3 weeks.  Please call us at [phone] if you have not heard about the biopsies in 3 weeks.    SIGNATURES/CONFIDENTIALITY: You and/or your care partner have signed paperwork which will be entered into your electronic medical record.  These signatures attest to the fact that that the information above on your After Visit Summary has been reviewed and is understood.  Full responsibility of the confidentiality of this discharge information lies with you and/or your care-partner.

## 2023-09-01 NOTE — Progress Notes (Signed)
PT taken to PACU. Monitors in place. VSS. Report given to RN. 

## 2023-09-02 ENCOUNTER — Telehealth: Payer: Self-pay | Admitting: *Deleted

## 2023-09-02 NOTE — Telephone Encounter (Signed)
  Follow up Call-     09/01/2023    8:11 AM  Call back number  Post procedure Call Back phone  # [phone]  Permission to leave phone message Yes     Patient questions:  Do you have a fever, pain , or abdominal swelling? No. Pain Score  0 *  Have you tolerated food without any problems? Yes.    Have you been able to return to your normal activities? Yes.    Do you have any questions about your discharge instructions: Diet   No. Medications  No. Follow up visit  No.  Do you have questions or concerns about your Care? No.  Actions: * If pain score is 4 or above: No action needed, pain <4.

## 2023-10-13 ENCOUNTER — Telehealth: Payer: Self-pay | Admitting: Neurology

## 2023-10-13 NOTE — Telephone Encounter (Signed)
 Pt LVM at 4:42 pm would like to cancel appt. Have switched to [facility]. Have already been seen by neurologist. Please cancel that appt.

## 2023-10-14 NOTE — Telephone Encounter (Signed)
Apt was cancelled

## 2023-10-20 ENCOUNTER — Ambulatory Visit: Payer: Commercial Managed Care - PPO | Admitting: Neurology

## 2023-12-25 ENCOUNTER — Ambulatory Visit: Payer: Commercial Managed Care - PPO | Admitting: Family Medicine

## 2023-12-28 ENCOUNTER — Other Ambulatory Visit: Payer: Self-pay | Admitting: Family Medicine

## 2024-07-05 ENCOUNTER — Other Ambulatory Visit: Payer: Self-pay | Admitting: Physician Assistant

## 2024-07-05 DIAGNOSIS — Z1231 Encounter for screening mammogram for malignant neoplasm of breast: Secondary | ICD-10-CM

## 2024-07-29 ENCOUNTER — Ambulatory Visit

## 2024-09-15 ENCOUNTER — Other Ambulatory Visit: Payer: Self-pay | Admitting: Family Medicine
